# Patient Record
Sex: Female | Born: 1960 | Race: Black or African American | Hispanic: No | Marital: Single | Smoking: Current every day smoker
Health system: Southern US, Community
[De-identification: ages and names within clinical notes are randomized; demographics above are authoritative.]

## PROBLEM LIST (undated history)

## (undated) DIAGNOSIS — F4310 Post-traumatic stress disorder, unspecified: Secondary | ICD-10-CM

## (undated) DIAGNOSIS — R0981 Nasal congestion: Secondary | ICD-10-CM

## (undated) DIAGNOSIS — F329 Major depressive disorder, single episode, unspecified: Secondary | ICD-10-CM

## (undated) DIAGNOSIS — E785 Hyperlipidemia, unspecified: Secondary | ICD-10-CM

## (undated) DIAGNOSIS — R270 Ataxia, unspecified: Secondary | ICD-10-CM

## (undated) DIAGNOSIS — M1990 Unspecified osteoarthritis, unspecified site: Secondary | ICD-10-CM

## (undated) DIAGNOSIS — J45909 Unspecified asthma, uncomplicated: Secondary | ICD-10-CM

## (undated) DIAGNOSIS — R42 Dizziness and giddiness: Secondary | ICD-10-CM

## (undated) DIAGNOSIS — F319 Bipolar disorder, unspecified: Secondary | ICD-10-CM

## (undated) DIAGNOSIS — F32A Depression, unspecified: Secondary | ICD-10-CM

## (undated) DIAGNOSIS — I10 Essential (primary) hypertension: Secondary | ICD-10-CM

## (undated) DIAGNOSIS — J40 Bronchitis, not specified as acute or chronic: Secondary | ICD-10-CM

## (undated) DIAGNOSIS — M797 Fibromyalgia: Secondary | ICD-10-CM

## (undated) HISTORY — DX: Fibromyalgia: M79.7

## (undated) HISTORY — PX: ABDOMINAL HYSTERECTOMY: SHX81

---
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
## 2007-07-30 ENCOUNTER — Ambulatory Visit: Payer: Self-pay | Admitting: *Deleted

## 2007-08-14 ENCOUNTER — Ambulatory Visit: Payer: Self-pay | Admitting: Internal Medicine

## 2007-10-20 ENCOUNTER — Ambulatory Visit: Payer: Self-pay | Admitting: Family Medicine

## 2007-10-20 LAB — CONVERTED CEMR LAB
ALT: 23 units/L (ref 0–35)
AST: 21 units/L (ref 0–37)
Albumin: 4.3 g/dL (ref 3.5–5.2)
Alkaline Phosphatase: 55 units/L (ref 39–117)
BUN: 8 mg/dL (ref 6–23)
Basophils Absolute: 0 10*3/uL (ref 0.0–0.1)
Basophils Relative: 0 % (ref 0–1)
CO2: 24 meq/L (ref 19–32)
Calcium: 9.6 mg/dL (ref 8.4–10.5)
Chloride: 101 meq/L (ref 96–112)
Cholesterol: 165 mg/dL (ref 0–200)
Creatinine, Ser: 0.92 mg/dL (ref 0.40–1.20)
Eosinophils Absolute: 0.1 10*3/uL (ref 0.0–0.7)
Eosinophils Relative: 1 % (ref 0–5)
Glucose, Bld: 121 mg/dL — ABNORMAL HIGH (ref 70–99)
HCT: 45.8 % (ref 36.0–46.0)
HDL: 73 mg/dL (ref >39)
HIV-1 antibody: NEGATIVE
HIV-2 Ab: NEGATIVE
HIV: REACTIVE
Hemoglobin: 14.5 g/dL (ref 12.0–15.0)
LDL Cholesterol: 63 mg/dL (ref 0–99)
Lymphocytes Relative: 42 % (ref 12–46)
Lymphs Abs: 2.2 10*3/uL (ref 0.7–4.0)
MCHC: 31.7 g/dL (ref 30.0–36.0)
MCV: 83.9 fL (ref 78.0–100.0)
Monocytes Absolute: 0.4 10*3/uL (ref 0.1–1.0)
Monocytes Relative: 7 % (ref 3–12)
Neutro Abs: 2.6 10*3/uL (ref 1.7–7.7)
Neutrophils Relative %: 49 % (ref 43–77)
Platelets: 228 10*3/uL (ref 150–400)
Potassium: 4.3 meq/L (ref 3.5–5.3)
RBC: 5.46 M/uL — ABNORMAL HIGH (ref 3.87–5.11)
RDW: 16.4 % — ABNORMAL HIGH (ref 11.5–15.5)
Sodium: 138 meq/L (ref 135–145)
TSH: 1.52 microintl units/mL (ref 0.350–4.50)
Total Bilirubin: 0.4 mg/dL (ref 0.3–1.2)
Total CHOL/HDL Ratio: 2.3
Total Protein: 6.9 g/dL (ref 6.0–8.3)
Triglycerides: 144 mg/dL (ref <150)
VLDL: 29 mg/dL (ref 0–40)
Vit D, 1,25-Dihydroxy: 26 — ABNORMAL LOW (ref 30–89)
WBC: 5.2 10*3/uL (ref 4.0–10.5)

## 2007-10-29 ENCOUNTER — Ambulatory Visit: Payer: Self-pay | Admitting: Internal Medicine

## 2007-11-13 ENCOUNTER — Ambulatory Visit: Payer: Self-pay | Admitting: Family Medicine

## 2008-03-26 ENCOUNTER — Ambulatory Visit: Payer: Self-pay | Admitting: Family Medicine

## 2008-08-31 ENCOUNTER — Ambulatory Visit: Payer: Self-pay | Admitting: Family Medicine

## 2009-07-26 ENCOUNTER — Emergency Department (HOSPITAL_COMMUNITY): Admission: EM | Admit: 2009-07-26 | Discharge: 2009-07-26 | Payer: Self-pay | Admitting: Emergency Medicine

## 2009-10-11 ENCOUNTER — Ambulatory Visit: Payer: Self-pay | Admitting: Internal Medicine

## 2010-02-15 ENCOUNTER — Ambulatory Visit (HOSPITAL_BASED_OUTPATIENT_CLINIC_OR_DEPARTMENT_OTHER): Admission: RE | Admit: 2010-02-15 | Payer: Self-pay | Source: Home / Self Care | Admitting: Internal Medicine

## 2010-02-15 ENCOUNTER — Ambulatory Visit (HOSPITAL_COMMUNITY): Admission: RE | Admit: 2010-02-15 | Payer: Self-pay | Source: Home / Self Care | Admitting: Internal Medicine

## 2010-02-23 ENCOUNTER — Other Ambulatory Visit (HOSPITAL_COMMUNITY): Payer: Self-pay | Admitting: *Deleted

## 2010-02-23 DIAGNOSIS — Z1231 Encounter for screening mammogram for malignant neoplasm of breast: Secondary | ICD-10-CM

## 2010-03-20 ENCOUNTER — Ambulatory Visit (HOSPITAL_BASED_OUTPATIENT_CLINIC_OR_DEPARTMENT_OTHER): Payer: Medicaid Other

## 2010-03-23 ENCOUNTER — Ambulatory Visit (HOSPITAL_COMMUNITY): Payer: Medicaid Other

## 2010-06-19 ENCOUNTER — Ambulatory Visit (HOSPITAL_BASED_OUTPATIENT_CLINIC_OR_DEPARTMENT_OTHER): Payer: Medicaid Other

## 2010-07-24 ENCOUNTER — Ambulatory Visit (HOSPITAL_BASED_OUTPATIENT_CLINIC_OR_DEPARTMENT_OTHER): Payer: Medicaid Other

## 2010-08-10 ENCOUNTER — Ambulatory Visit: Payer: Medicaid Other | Admitting: Physical Therapy

## 2010-08-15 ENCOUNTER — Other Ambulatory Visit (HOSPITAL_COMMUNITY): Payer: Self-pay | Admitting: Internal Medicine

## 2010-08-15 DIAGNOSIS — Z1231 Encounter for screening mammogram for malignant neoplasm of breast: Secondary | ICD-10-CM

## 2010-09-04 ENCOUNTER — Ambulatory Visit (HOSPITAL_BASED_OUTPATIENT_CLINIC_OR_DEPARTMENT_OTHER): Payer: Medicaid Other

## 2010-09-12 ENCOUNTER — Ambulatory Visit (HOSPITAL_COMMUNITY)
Admission: RE | Admit: 2010-09-12 | Discharge: 2010-09-12 | Disposition: A | Discharge status: Home / Self Care | Payer: Medicaid Other | Source: Ambulatory Visit | Attending: Internal Medicine | Admitting: Internal Medicine

## 2010-09-12 DIAGNOSIS — Z1231 Encounter for screening mammogram for malignant neoplasm of breast: Secondary | ICD-10-CM | POA: Insufficient documentation

## 2010-09-13 ENCOUNTER — Ambulatory Visit (HOSPITAL_BASED_OUTPATIENT_CLINIC_OR_DEPARTMENT_OTHER): Payer: Medicaid Other | Attending: Internal Medicine

## 2010-09-13 DIAGNOSIS — G4733 Obstructive sleep apnea (adult) (pediatric): Secondary | ICD-10-CM | POA: Insufficient documentation

## 2010-09-13 DIAGNOSIS — Z79899 Other long term (current) drug therapy: Secondary | ICD-10-CM | POA: Insufficient documentation

## 2010-09-16 DIAGNOSIS — G4733 Obstructive sleep apnea (adult) (pediatric): Secondary | ICD-10-CM

## 2010-09-16 DIAGNOSIS — Z79899 Other long term (current) drug therapy: Secondary | ICD-10-CM

## 2010-09-17 NOTE — Procedures (Signed)
Kathleen Herrera, Kathleen Herrera             ACCOUNT NO.:  [id number]  MEDICAL RECORD NO.:  [id number]          PATIENT TYPE:  OUT  LOCATION:  SLEEP CENTER                 FACILITY:  [facility]  PHYSICIAN:  Theia Dezeeuw D. Maple Hudson, MD, FCCP, FACPDATE OF BIRTH:  10/01/1959  DATE OF STUDY:  09/13/2010                           NOCTURNAL POLYSOMNOGRAM  REFERRING PHYSICIAN:  Fleet Contras, M.D.  INDICATION FOR STUDY:  Hypersomnia with sleep apnea.  EPWORTH SLEEPINESS SCORE:  13/24.  BMI 44.6.  Weight 260 pounds.  Height 64 inches.  Neck 16 inches.  HOME MEDICATIONS:  Charted and reviewed.  SLEEP ARCHITECTURE:  Total sleep time 332 minutes with sleep efficiency 87.1%.  Stage I was 2.4%, stage II 86%, stage III absent, REM 11.6% of total sleep time.  Sleep latency 45.5 minutes.  REM latency 200 minutes. Awake after sleep onset 3 minutes.  Arousal index 15.4.  BEDTIME MEDICATIONS:  Listed as well by the technician included Lunesta, Paxil, Vistaril, Neurontin.  RESPIRATORY DATA:  Apnea-hypopnea index (AHI) 7 per hour.  A total of 39 events was scored including 5 obstructive apneas and 34 hypopneas. Events were seen in all sleep positions, particularly while supine.  REM AHI 1.6 per hour, RDI 9 per hour.  There were insufficient numbers of respiratory events to permit initiation of CPAP titration by split protocol on this study night.  OXYGEN DATA:  Loud snoring with oxygen desaturation to a nadir of 88% and a mean oxygen saturation through the study of 96.6% on room air.  CARDIAC DATA:  Normal sinus rhythm.  MOVEMENT-PARASOMNIA:  No significant movement disturbance.  No bathroom trips .  IMPRESSIONS-RECOMMENDATIONS: 1. Mild obstructive sleep apnea/hypopnea syndrome, AHI 7 per hour with     events in all sleep positions, loud snoring, and oxygen     desaturation to a nadir of 88% with mean saturation of 96.6% on     room air through the study. 2. Sleep pattern reflected medication, with marked  predominance of     stage II sleep and little incidental waking.  Bedtime medication     included Lunesta and other names as listed above.     Reace Breshears D. Maple Hudson, MD, [facility], FACP Diplomate, Biomedical engineer of Sleep Medicine Electronically Signed    CDY/MEDQ  D:  09/16/2010 09:30:55  T:  09/16/2010 10:03:59  Job:  [id number]

## 2011-01-13 ENCOUNTER — Encounter: Payer: Self-pay | Admitting: *Deleted

## 2011-01-13 ENCOUNTER — Emergency Department (HOSPITAL_COMMUNITY)
Admission: EM | Admit: 2011-01-13 | Discharge: 2011-01-13 | Disposition: A | Discharge status: Home / Self Care | Payer: Medicaid Other | Attending: Emergency Medicine | Admitting: Emergency Medicine

## 2011-01-13 ENCOUNTER — Emergency Department (HOSPITAL_COMMUNITY): Payer: Medicaid Other

## 2011-01-13 DIAGNOSIS — J111 Influenza due to unidentified influenza virus with other respiratory manifestations: Secondary | ICD-10-CM | POA: Insufficient documentation

## 2011-01-13 DIAGNOSIS — R0602 Shortness of breath: Secondary | ICD-10-CM | POA: Insufficient documentation

## 2011-01-13 DIAGNOSIS — J40 Bronchitis, not specified as acute or chronic: Secondary | ICD-10-CM | POA: Insufficient documentation

## 2011-01-13 HISTORY — DX: Hyperlipidemia, unspecified: E78.5

## 2011-01-13 HISTORY — DX: Depression, unspecified: F32.A

## 2011-01-13 HISTORY — DX: Major depressive disorder, single episode, unspecified: F32.9

## 2011-01-13 HISTORY — DX: Bronchitis, not specified as acute or chronic: J40

## 2011-01-13 HISTORY — DX: Post-traumatic stress disorder, unspecified: F43.10

## 2011-01-13 HISTORY — DX: Nasal congestion: R09.81

## 2011-01-13 HISTORY — DX: Unspecified osteoarthritis, unspecified site: M19.90

## 2011-01-13 LAB — CBC
MCH: 27.3 pg (ref 26.0–34.0)
MCHC: 32.9 g/dL (ref 30.0–36.0)
MCV: 83 fL (ref 78.0–100.0)
Platelets: 172 10*3/uL (ref 150–400)
RDW: 13.8 % (ref 11.5–15.5)

## 2011-01-13 LAB — BASIC METABOLIC PANEL
Calcium: 10.2 mg/dL (ref 8.4–10.5)
Creatinine, Ser: 1 mg/dL (ref 0.50–1.10)
GFR calc Af Amer: 74 mL/min — ABNORMAL LOW (ref >90)

## 2011-01-13 MED ORDER — AZITHROMYCIN 250 MG PO TABS: 250.0000 mg | ORAL_TABLET | Freq: Every day | ORAL | Status: AC

## 2011-01-13 MED ORDER — NAPROXEN 500 MG PO TABS: 500.0000 mg | ORAL_TABLET | Freq: Two times a day (BID) | ORAL | Status: AC

## 2011-01-13 MED ORDER — ALBUTEROL SULFATE HFA 108 (90 BASE) MCG/ACT IN AERS: 1.0000 | INHALATION_SPRAY | Freq: Four times a day (QID) | RESPIRATORY_TRACT | Status: DC | PRN

## 2011-01-13 MED ORDER — IBUPROFEN 800 MG PO TABS
ORAL_TABLET | ORAL | Status: AC
  Administered 2011-01-13: 800 mg via ORAL
  Filled 2011-01-13: qty 1

## 2011-01-13 MED ORDER — ACETAMINOPHEN 325 MG PO TABS
650.0000 mg | ORAL_TABLET | Freq: Once | ORAL | Status: AC
  Administered 2011-01-13: 650 mg via ORAL
  Filled 2011-01-13: qty 2

## 2011-01-13 NOTE — ED Notes (Signed)
Temp now 100.5  Voices she is feeling slightly improved---D/cd home with friend.

## 2011-01-13 NOTE — ED Notes (Signed)
Pt in room 25 with family at bedside.

## 2011-01-13 NOTE — ED Provider Notes (Signed)
History     CSN: [id number] Arrival date & time: 01/13/2011  7:12 AM   First MD Initiated Contact with Patient 01/13/11 [phone]      Chief Complaint  Patient presents with  . Shortness of Breath    pt states 5-6 days ago pt was dx with bronchitis. pt was given z pack for 5 days. pt now out of antibiotics. pt now with nonproductive cough.     (Consider location/radiation/quality/duration/timing/severity/associated sxs/prior treatment) Patient is a 50 y.o. female presenting with shortness of breath. The history is provided by the patient, the EMS personnel and a relative.  Shortness of Breath  The current episode started 3 to 5 days ago. The problem has been gradually worsening. The problem is moderate. The symptoms are relieved by nothing. The symptoms are aggravated by nothing. Associated symptoms include a fever, cough and shortness of breath. Pertinent negatives include no chest pain, no chest pressure, no sore throat, no stridor and no wheezing. The cough is productive. Nothing relieves the cough. Nothing worsens the cough.   Patient with complaint of shortness of breath productive cough of bronchitis that is improved with Zithromax last had Zithromax 3 weeks ago. There's been ongoing for the past week but getting worse recently. He did with some bodyaches and temp 102F.   Patient arrived she was given breakfast and was eating that fine appeared in no acute distress.  Past Medical History  Diagnosis Date  . Cancer   . Hyperlipemia   . Arthritis   . Sinus congestion   . Depression   . Post traumatic stress disorder   . Bronchitis     History reviewed. No pertinent past surgical history.  History reviewed. No pertinent family history.  History  Substance Use Topics  . Smoking status: Current Everyday Smoker    Types: Cigarettes  . Smokeless tobacco: Not on file  . Alcohol Use: Yes     occasionally    OB History    Grav Para Term Preterm Abortions TAB SAB Ect Mult Living                    Review of Systems  Constitutional: Positive for fever.  HENT: Negative for congestion, sore throat and neck pain.   Eyes: Negative for redness and visual disturbance.  Respiratory: Positive for cough and shortness of breath. Negative for wheezing and stridor.   Cardiovascular: Negative for chest pain.  Gastrointestinal: Negative for nausea, vomiting and abdominal pain.  Genitourinary: Negative for dysuria.  Musculoskeletal: Negative for back pain.  Skin: Negative for rash.  Neurological: Negative for headaches.  Hematological: Does not bruise/bleed easily.    Allergies  Review of patient's allergies indicates no known allergies.  Home Medications   Current Outpatient Rx  Name Route Sig Dispense Refill  . ATENOLOL 100 MG PO TABS Oral Take 100 mg by mouth daily.      . CELECOXIB 200 MG PO CAPS Oral Take 200 mg by mouth 2 (two) times daily.      Marland Kitchen CO-ENZYME Q-10 50 MG PO CAPS Oral Take 50 mg by mouth 2 (two) times daily.      Marland Kitchen LITHIUM CARBONATE 300 MG PO CAPS Oral Take 300 mg by mouth 3 (three) times daily with meals.      Carma Leaven M PLUS PO TABS Oral Take 1 tablet by mouth daily.      Marland Kitchen PAROXETINE HCL 40 MG PO TABS Oral Take 40 mg by mouth every morning.      Marland Kitchen  SIMVASTATIN 40 MG PO TABS Oral Take 40 mg by mouth at bedtime.      . SPIRONOLACTONE 50 MG PO TABS Oral Take 50 mg by mouth daily.      . ALBUTEROL SULFATE HFA 108 (90 BASE) MCG/ACT IN AERS Inhalation Inhale 1-2 puffs into the lungs every 6 (six) hours as needed for wheezing. 1 Inhaler 0  . AZITHROMYCIN 250 MG PO TABS Oral Take 1 tablet (250 mg total) by mouth daily. Take first 2 tablets together, then 1 every day until finished. 6 tablet 0  . NAPROXEN 500 MG PO TABS Oral Take 1 tablet (500 mg total) by mouth 2 (two) times daily. 20 tablet 0    BP 119/62  Pulse 80  Temp(Src) 98.7 F (37.1 C) (Oral)  Resp 18  Ht 5\' 4"  (1.626 m)  Wt 260 lb (117.935 kg)  BMI 44.63 kg/m2  SpO2 95%  Physical Exam   Nursing note and vitals reviewed. Constitutional: She is oriented to person, place, and time. She appears well-developed and well-nourished. No distress.  HENT:  Head: Normocephalic and atraumatic.  Mouth/Throat: Oropharynx is clear and moist.  Eyes: Conjunctivae and EOM are normal. Pupils are equal, round, and reactive to light.  Neck: Normal range of motion. Neck supple.  Cardiovascular: Normal rate, regular rhythm and normal heart sounds.   No murmur heard. Pulmonary/Chest: Effort normal and breath sounds normal. No respiratory distress. She has no wheezes. She has no rales.  Abdominal: Soft. Bowel sounds are normal. There is no tenderness.  Musculoskeletal: Normal range of motion.  Neurological: She is alert and oriented to person, place, and time. No cranial nerve deficit. She exhibits normal muscle tone. Coordination normal.  Skin: Skin is warm. No rash noted.    ED Course  Procedures (including critical care time)  Labs Reviewed  CBC - Abnormal; Notable for the following:    RBC 5.24 (*)    All other components within normal limits  BASIC METABOLIC PANEL - Abnormal; Notable for the following:    Glucose, Bld 118 (*)    GFR calc non Af Amer 64 (*)    GFR calc Af Amer 74 (*)    All other components within normal limits   Dg Chest 2 View  01/13/2011  *RADIOLOGY REPORT*  Clinical Data: Cough.  Fever.  CHEST - 2 VIEW  Comparison: None.  Findings: Low volume chest.  Bilateral basilar atelectasis. Cardiopericardial silhouette and mediastinal contours appear within normal limits.  No airspace disease.  Negative for effusion.  Suboptimal evaluation anterior mediastinum on the lateral view because the arms are not raised over head.  IMPRESSION: Low volume chest.  No gross acute cardiopulmonary disease.  Original Report Authenticated By: Andreas Newport, M.D.     1. Bronchitis with flu       MDM   Several day history of cough and shortness of breath more recently started with  fever suggestive of past upper rest drink section and is now moved into more an influenza-like illness. As bodyaches 10 202.5 here chest x-ray negative for pneumonia. In no acute distress nontoxic oxygen saturation on room air is 93%. The with Zithromax for bronchitis component is as helped her in the past also given albuterol inhaler Naprosyn for bodyaches and Tylenol for fever. Has a primary care provider that she can followup with. White blood cell count is normal, pain abnormalities on the patient's electrolytes.        Shelda Jakes, MD 01/13/11 [phone]

## 2011-01-13 NOTE — ED Notes (Signed)
Oral temp 102.5--waiting for temp to downward trend so pt. Can be d/ cd.

## 2011-01-13 NOTE — ED Notes (Signed)
Returned from x-ray and c/o generalized achiness---Oral temp retaken--102.3

## 2011-09-26 ENCOUNTER — Other Ambulatory Visit (HOSPITAL_COMMUNITY): Payer: Self-pay | Admitting: Nephrology

## 2011-09-26 DIAGNOSIS — Z1231 Encounter for screening mammogram for malignant neoplasm of breast: Secondary | ICD-10-CM

## 2011-10-15 ENCOUNTER — Ambulatory Visit (HOSPITAL_COMMUNITY): Payer: Medicaid Other

## 2011-10-26 ENCOUNTER — Ambulatory Visit (HOSPITAL_COMMUNITY)
Admission: RE | Admit: 2011-10-26 | Discharge: 2011-10-26 | Disposition: A | Discharge status: Home / Self Care | Payer: Medicaid Other | Source: Ambulatory Visit | Attending: Nephrology | Admitting: Nephrology

## 2011-10-26 DIAGNOSIS — Z1231 Encounter for screening mammogram for malignant neoplasm of breast: Secondary | ICD-10-CM | POA: Insufficient documentation

## 2012-04-17 DIAGNOSIS — F32A Depression, unspecified: Secondary | ICD-10-CM | POA: Insufficient documentation

## 2012-04-17 DIAGNOSIS — F3181 Bipolar II disorder: Secondary | ICD-10-CM | POA: Insufficient documentation

## 2012-05-26 DIAGNOSIS — M1990 Unspecified osteoarthritis, unspecified site: Secondary | ICD-10-CM | POA: Insufficient documentation

## 2012-06-12 DIAGNOSIS — M1990 Unspecified osteoarthritis, unspecified site: Secondary | ICD-10-CM | POA: Insufficient documentation

## 2012-07-15 DIAGNOSIS — M545 Low back pain, unspecified: Secondary | ICD-10-CM | POA: Insufficient documentation

## 2012-07-15 DIAGNOSIS — K760 Fatty (change of) liver, not elsewhere classified: Secondary | ICD-10-CM | POA: Insufficient documentation

## 2012-07-15 DIAGNOSIS — M25569 Pain in unspecified knee: Secondary | ICD-10-CM | POA: Insufficient documentation

## 2012-10-01 ENCOUNTER — Other Ambulatory Visit (HOSPITAL_COMMUNITY): Payer: Self-pay | Admitting: Physician Assistant

## 2012-10-01 DIAGNOSIS — Z1231 Encounter for screening mammogram for malignant neoplasm of breast: Secondary | ICD-10-CM

## 2012-10-30 ENCOUNTER — Ambulatory Visit (HOSPITAL_COMMUNITY): Payer: Medicaid Other

## 2012-11-04 ENCOUNTER — Ambulatory Visit (HOSPITAL_COMMUNITY): Payer: Medicaid Other | Attending: Physician Assistant

## 2012-12-04 ENCOUNTER — Ambulatory Visit (HOSPITAL_COMMUNITY): Payer: Medicaid Other | Attending: Physician Assistant

## 2012-12-31 ENCOUNTER — Ambulatory Visit (HOSPITAL_COMMUNITY): Payer: Medicaid Other | Attending: Physician Assistant

## 2013-02-02 ENCOUNTER — Other Ambulatory Visit (HOSPITAL_COMMUNITY): Payer: Self-pay | Admitting: Physician Assistant

## 2013-02-02 DIAGNOSIS — Z1231 Encounter for screening mammogram for malignant neoplasm of breast: Secondary | ICD-10-CM

## 2013-02-05 DIAGNOSIS — M797 Fibromyalgia: Secondary | ICD-10-CM

## 2013-02-05 HISTORY — DX: Fibromyalgia: M79.7

## 2013-02-13 ENCOUNTER — Ambulatory Visit (HOSPITAL_COMMUNITY): Payer: Medicaid Other

## 2013-03-05 ENCOUNTER — Ambulatory Visit (HOSPITAL_COMMUNITY): Admission: RE | Admit: 2013-03-05 | Payer: Medicaid Other | Source: Ambulatory Visit

## 2013-03-06 DIAGNOSIS — R945 Abnormal results of liver function studies: Secondary | ICD-10-CM | POA: Insufficient documentation

## 2013-03-06 DIAGNOSIS — R7989 Other specified abnormal findings of blood chemistry: Secondary | ICD-10-CM | POA: Insufficient documentation

## 2013-11-30 ENCOUNTER — Emergency Department (HOSPITAL_COMMUNITY)
Admission: EM | Admit: 2013-11-30 | Discharge: 2013-11-30 | Disposition: A | Discharge status: Home / Self Care | Payer: Medicaid Other | Attending: Emergency Medicine | Admitting: Emergency Medicine

## 2013-11-30 ENCOUNTER — Emergency Department (HOSPITAL_COMMUNITY): Payer: Medicaid Other

## 2013-11-30 ENCOUNTER — Encounter (HOSPITAL_COMMUNITY): Payer: Self-pay | Admitting: Emergency Medicine

## 2013-11-30 DIAGNOSIS — Z79899 Other long term (current) drug therapy: Secondary | ICD-10-CM | POA: Insufficient documentation

## 2013-11-30 DIAGNOSIS — Y9389 Activity, other specified: Secondary | ICD-10-CM | POA: Insufficient documentation

## 2013-11-30 DIAGNOSIS — Z720 Tobacco use: Secondary | ICD-10-CM | POA: Insufficient documentation

## 2013-11-30 DIAGNOSIS — E785 Hyperlipidemia, unspecified: Secondary | ICD-10-CM | POA: Insufficient documentation

## 2013-11-30 DIAGNOSIS — S62305A Unspecified fracture of fourth metacarpal bone, left hand, initial encounter for closed fracture: Secondary | ICD-10-CM | POA: Insufficient documentation

## 2013-11-30 DIAGNOSIS — Y929 Unspecified place or not applicable: Secondary | ICD-10-CM | POA: Insufficient documentation

## 2013-11-30 DIAGNOSIS — S62309A Unspecified fracture of unspecified metacarpal bone, initial encounter for closed fracture: Secondary | ICD-10-CM

## 2013-11-30 DIAGNOSIS — W1839XA Other fall on same level, initial encounter: Secondary | ICD-10-CM | POA: Insufficient documentation

## 2013-11-30 DIAGNOSIS — F329 Major depressive disorder, single episode, unspecified: Secondary | ICD-10-CM | POA: Insufficient documentation

## 2013-11-30 DIAGNOSIS — S62307A Unspecified fracture of fifth metacarpal bone, left hand, initial encounter for closed fracture: Secondary | ICD-10-CM | POA: Insufficient documentation

## 2013-11-30 DIAGNOSIS — W19XXXA Unspecified fall, initial encounter: Secondary | ICD-10-CM

## 2013-11-30 DIAGNOSIS — Z8709 Personal history of other diseases of the respiratory system: Secondary | ICD-10-CM | POA: Insufficient documentation

## 2013-11-30 DIAGNOSIS — M1990 Unspecified osteoarthritis, unspecified site: Secondary | ICD-10-CM | POA: Insufficient documentation

## 2013-11-30 DIAGNOSIS — Z859 Personal history of malignant neoplasm, unspecified: Secondary | ICD-10-CM | POA: Insufficient documentation

## 2013-11-30 MED ORDER — PENICILLIN V POTASSIUM 500 MG PO TABS: 500.0000 mg | ORAL_TABLET | Freq: Four times a day (QID) | ORAL | Status: DC

## 2013-11-30 MED ORDER — OXYCODONE-ACETAMINOPHEN 5-325 MG PO TABS: 1.0000 | ORAL_TABLET | Freq: Four times a day (QID) | ORAL | Status: DC | PRN

## 2013-11-30 MED ORDER — POLYETHYLENE GLYCOL 3350 17 G PO PACK: 17.0000 g | PACK | Freq: Every day | ORAL | Status: DC

## 2013-11-30 MED ORDER — DOCUSATE SODIUM 100 MG PO CAPS: 100.0000 mg | ORAL_CAPSULE | Freq: Two times a day (BID) | ORAL | Status: DC

## 2013-11-30 MED ORDER — OXYCODONE-ACETAMINOPHEN 5-325 MG PO TABS
1.0000 | ORAL_TABLET | Freq: Once | ORAL | Status: AC
  Administered 2013-11-30: 1 via ORAL
  Filled 2013-11-30: qty 1

## 2013-11-30 MED ORDER — IBUPROFEN 800 MG PO TABS: 800.0000 mg | ORAL_TABLET | Freq: Three times a day (TID) | ORAL | Status: DC | PRN

## 2013-11-30 MED ORDER — ATENOLOL 100 MG PO TABS: 100.0000 mg | ORAL_TABLET | Freq: Every day | ORAL | Status: DC

## 2013-11-30 MED ORDER — GLYCERIN (LAXATIVE) 2 G RE SUPP: 1.0000 | Freq: Once | RECTAL | Status: DC

## 2013-11-30 NOTE — ED Provider Notes (Signed)
CSN: [id number]     Arrival date & time 11/30/13  1041 History   First MD Initiated Contact with Patient 11/30/13 1051     Chief Complaint  Patient presents with  . Fall  . Hand Pain  . Medication Refill     (Consider location/radiation/quality/duration/timing/severity/associated sxs/prior Treatment) HPI Patient presents to the emergency department with a fall that occurred just prior to arrival.  The patient states she shut a bathtub when she fell forward landing on her left wrist.  Patient states she did not hit her head or loose consciousness.  Patient denies chest pain, shortness of breath, nausea, vomiting, weakness, headache, blurred vision, neck pain, back pain or syncope.  Patient states that she did not take any medications prior to arrival Past Medical History  Diagnosis Date  . Cancer   . Hyperlipemia   . Arthritis   . Sinus congestion   . Depression   . Post traumatic stress disorder   . Bronchitis    No past surgical history on file. No family history on file. History  Substance Use Topics  . Smoking status: Current Every Day Smoker    Types: Cigarettes  . Smokeless tobacco: Not on file  . Alcohol Use: Yes     Comment: occasionally   OB History   Grav Para Term Preterm Abortions TAB SAB Ect Mult Living                 Review of Systems   All other systems negative except as documented in the HPI. All pertinent positives and negatives as reviewed in the HPI. Allergies  Review of patient's allergies indicates no known allergies.  Home Medications   Prior to Admission medications   Medication Sig Start Date End Date Taking? Authorizing Provider  albuterol (PROVENTIL HFA;VENTOLIN HFA) 108 (90 BASE) MCG/ACT inhaler Inhale 1-2 puffs into the lungs every 6 (six) hours as needed for wheezing. 01/13/11 01/13/12  Vanetta MuldersScott Zackowski, MD  atenolol (TENORMIN) 100 MG tablet Take 1 tablet (100 mg total) by mouth daily. 11/30/13   Jamesetta Orleanshristopher W Dorethia Jeanmarie, PA-C  celecoxib  (CELEBREX) 200 MG capsule Take 200 mg by mouth 2 (two) times daily.      Historical Provider, MD  co-enzyme Q-10 50 MG capsule Take 50 mg by mouth 2 (two) times daily.      Historical Provider, MD  docusate sodium (COLACE) 100 MG capsule Take 1 capsule (100 mg total) by mouth every 12 (twelve) hours. 11/30/13   Jamesetta Orleanshristopher W Elva Mauro, PA-C  glycerin adult (GLYCERIN ADULT) 2 G SUPP Place 1 suppository rectally once. 11/30/13   Carlyle Dollyhristopher W Babbette Dalesandro, PA-C  lithium carbonate 300 MG capsule Take 300 mg by mouth 3 (three) times daily with meals.      Historical Provider, MD  Multiple Vitamins-Minerals (MULTIVITAMINS THER. W/MINERALS) TABS Take 1 tablet by mouth daily.      Historical Provider, MD  oxyCODONE-acetaminophen (PERCOCET/ROXICET) 5-325 MG per tablet Take 1 tablet by mouth every 6 (six) hours as needed for severe pain. 11/30/13   Jamesetta Orleanshristopher W Jalayla Chrismer, PA-C  PARoxetine (PAXIL) 40 MG tablet Take 40 mg by mouth every morning.      Historical Provider, MD  polyethylene glycol (MIRALAX) packet Take 17 g by mouth daily. 11/30/13   Jamesetta Orleanshristopher W Auriel Kist, PA-C  simvastatin (ZOCOR) 40 MG tablet Take 40 mg by mouth at bedtime.      Historical Provider, MD  spironolactone (ALDACTONE) 50 MG tablet Take 50 mg by mouth daily.  Historical Provider, MD   BP 140/57  Pulse 85  Temp(Src) 98.6 F (37 C) (Oral)  Resp 16  SpO2 99% Physical Exam  Nursing note and vitals reviewed. Constitutional: She is oriented to person, place, and time. She appears well-developed and well-nourished. No distress.  HENT:  Head: Normocephalic and atraumatic.  Eyes: Pupils are equal, round, and reactive to light.  Neck: Normal range of motion. Neck supple.  Cardiovascular: Normal rate, regular rhythm and normal heart sounds.   Pulmonary/Chest: Effort normal and breath sounds normal.  Musculoskeletal:       Left hand: She exhibits decreased range of motion, tenderness, bony tenderness and swelling. She exhibits normal  two-point discrimination, normal capillary refill, no deformity and no laceration. Normal sensation noted. Normal strength noted.  Neurological: She is alert and oriented to person, place, and time. She exhibits normal muscle tone. Coordination normal.  Skin: Skin is warm and dry.    ED Course  Procedures (including critical care time) Labs Review Labs Reviewed - No data to display  Imaging Review Dg Wrist Complete Left  11/30/2013   CLINICAL DATA:  Patient fell in the shower today with pain swelling posterior left hand and wrist, initial evaluation  EXAM: LEFT WRIST - COMPLETE 3+ VIEW  COMPARISON:  None.  FINDINGS: Mild arthritis of the first carpal metacarpal joint. No fracture or dislocation. No focal or acute osseous abnormalities otherwise.  IMPRESSION: No acute findings   Electronically Signed   By: Esperanza Heiraymond  Rubner M.D.   On: 11/30/2013 12:46   Dg Hand Complete Left  11/30/2013   CLINICAL DATA:  Fall in shower today.  Pain  EXAM: LEFT HAND - COMPLETE 3+ VIEW  COMPARISON:  None  FINDINGS: Nondisplaced fracture mid fourth metacarpal.  Mildly angulated and displaced fracture distal fifth metacarpal  No other fracture is identified.  No significant arthropathy.  IMPRESSION: Fractures of the fourth and fifth metacarpals   Electronically Signed   By: Marlan Palauharles  Clark M.D.   On: 11/30/2013 12:47    Patient referred to hand surgery for further evaluation and care following her injury.  She is placed in an ulnar gutter splint.  Told to return here as needed.  Told ice and elevate her hand  Carlyle DollyChristopher W Wiliam Cauthorn, PA-C 11/30/13 1950

## 2013-11-30 NOTE — ED Notes (Signed)
Waiting for ortho 

## 2013-11-30 NOTE — Discharge Instructions (Signed)
Follow up with the orthopedist provided. Ice and elevate the hand.

## 2013-11-30 NOTE — ED Notes (Signed)
Pt reports that she ran out of HTN medication and needs a refill.  Sts she is unable to get into her PCP today.

## 2013-11-30 NOTE — ED Notes (Signed)
Per EMS: Pt from home.  Called out because she fell while she was getting ready this morning.  States that she frequently has dizziness from behavioral health meds that she takes.  No LOC. Bilateral knee pain and lt hand pain.  Swelling noted.  Good distal PMS.  EKG normal w/ EMS.  22 in Lt forearm.  Has been given 50 mcg fentanyl.

## 2013-11-30 NOTE — ED Notes (Signed)
Patient transported to X-ray 

## 2013-11-30 NOTE — ED Provider Notes (Deleted)
CSN: [id number]     Arrival date & time 11/30/13  1041 History   First MD Initiated Contact with Patient 11/30/13 1051     Chief Complaint  Patient presents with  . Fall  . Hand Pain     (Consider location/radiation/quality/duration/timing/severity/associated sxs/prior Treatment) HPI Patient presents to the emergency department with right upper third molar pain and swelling.  The patient states that she noticed 2 days ago became painful and the swelling started yesterday.  The patient states that nothing she tried at home helped with her pain.  The patient states that movement and palpation make the pain worse.  Patient denies nausea, vomiting, throat swelling, tongue swelling,  Neck swelling, chest pain, shortness of breath, headache, blurred vision, weakness, dizziness, lightheadedness or syncope.  The patient also denies having any fevers.  Patient states that she did make an appointment with her dentist.  Patient was complaining earlier of some knee pain but no longer has any complaints of this.  She states that her medicines knee make her dizzy at times felt like it is more related to pain. Past Medical History  Diagnosis Date  . Cancer   . Hyperlipemia   . Arthritis   . Sinus congestion   . Depression   . Post traumatic stress disorder   . Bronchitis    No past surgical history on file. No family history on file. History  Substance Use Topics  . Smoking status: Current Every Day Smoker    Types: Cigarettes  . Smokeless tobacco: Not on file  . Alcohol Use: Yes     Comment: occasionally   OB History   Grav Para Term Preterm Abortions TAB SAB Ect Mult Living                 Review of Systems  All other systems negative except as documented in the HPI. All pertinent positives and negatives as reviewed in the HPI.   Allergies  Review of patient's allergies indicates no known allergies.  Home Medications   Prior to Admission medications   Medication Sig Start Date  End Date Taking? Authorizing Provider  albuterol (PROVENTIL HFA;VENTOLIN HFA) 108 (90 BASE) MCG/ACT inhaler Inhale 1-2 puffs into the lungs every 6 (six) hours as needed for wheezing. 01/13/11 01/13/12  Vanetta MuldersScott Zackowski, MD  atenolol (TENORMIN) 100 MG tablet Take 100 mg by mouth daily.      Historical Provider, MD  celecoxib (CELEBREX) 200 MG capsule Take 200 mg by mouth 2 (two) times daily.      Historical Provider, MD  co-enzyme Q-10 50 MG capsule Take 50 mg by mouth 2 (two) times daily.      Historical Provider, MD  lithium carbonate 300 MG capsule Take 300 mg by mouth 3 (three) times daily with meals.      Historical Provider, MD  Multiple Vitamins-Minerals (MULTIVITAMINS THER. W/MINERALS) TABS Take 1 tablet by mouth daily.      Historical Provider, MD  PARoxetine (PAXIL) 40 MG tablet Take 40 mg by mouth every morning.      Historical Provider, MD  simvastatin (ZOCOR) 40 MG tablet Take 40 mg by mouth at bedtime.      Historical Provider, MD  spironolactone (ALDACTONE) 50 MG tablet Take 50 mg by mouth daily.      Historical Provider, MD   There were no vitals taken for this visit. Physical Exam  Nursing note and vitals reviewed. Constitutional: She is oriented to person, place, and time. She appears well-developed and  well-nourished. No distress.  HENT:  Head: Normocephalic and atraumatic.  Mouth/Throat: Uvula is midline, oropharynx is clear and moist and mucous membranes are normal. No trismus in the jaw. Abnormal dentition. Dental caries present. No uvula swelling. No posterior oropharyngeal edema or posterior oropharyngeal erythema.    Eyes: Pupils are equal, round, and reactive to light.  Neck: Normal range of motion. Neck supple.  Cardiovascular: Normal rate, regular rhythm and normal heart sounds.  Exam reveals no gallop and no friction rub.   No murmur heard. Pulmonary/Chest: Effort normal and breath sounds normal. No respiratory distress.  Neurological: She is alert and oriented to  person, place, and time. She exhibits normal muscle tone. Coordination normal.  Skin: Skin is warm and dry. No rash noted. No erythema.    ED Course  Procedures (including critical care time)  Patient was treated for dental abscess and referred to dentistry and told to rinse with warm water and peroxide 3 times a day.  Told to return here as needed  Carlyle DollyChristopher W Jeffey Janssen, PA-C 11/30/13 1115

## 2013-12-01 NOTE — ED Provider Notes (Signed)
Medical screening examination/treatment/procedure(s) were performed by non-physician practitioner and as supervising physician I was immediately available for consultation/collaboration.   EKG Interpretation None       Efren Kross L Nidya Bouyer, MD 12/01/13 0732 

## 2014-01-06 ENCOUNTER — Emergency Department (HOSPITAL_COMMUNITY)
Admission: EM | Admit: 2014-01-06 | Discharge: 2014-01-06 | Disposition: A | Discharge status: Home / Self Care | Payer: Medicaid Other | Attending: Emergency Medicine | Admitting: Emergency Medicine

## 2014-01-06 ENCOUNTER — Encounter (HOSPITAL_COMMUNITY): Payer: Self-pay

## 2014-01-06 DIAGNOSIS — N390 Urinary tract infection, site not specified: Secondary | ICD-10-CM | POA: Diagnosis not present

## 2014-01-06 DIAGNOSIS — Z859 Personal history of malignant neoplasm, unspecified: Secondary | ICD-10-CM | POA: Diagnosis not present

## 2014-01-06 DIAGNOSIS — Z8709 Personal history of other diseases of the respiratory system: Secondary | ICD-10-CM | POA: Diagnosis not present

## 2014-01-06 DIAGNOSIS — K5900 Constipation, unspecified: Secondary | ICD-10-CM | POA: Diagnosis not present

## 2014-01-06 DIAGNOSIS — F329 Major depressive disorder, single episode, unspecified: Secondary | ICD-10-CM | POA: Insufficient documentation

## 2014-01-06 DIAGNOSIS — M1990 Unspecified osteoarthritis, unspecified site: Secondary | ICD-10-CM | POA: Diagnosis not present

## 2014-01-06 DIAGNOSIS — Z720 Tobacco use: Secondary | ICD-10-CM | POA: Insufficient documentation

## 2014-01-06 DIAGNOSIS — R195 Other fecal abnormalities: Secondary | ICD-10-CM | POA: Diagnosis present

## 2014-01-06 DIAGNOSIS — F4310 Post-traumatic stress disorder, unspecified: Secondary | ICD-10-CM | POA: Diagnosis not present

## 2014-01-06 DIAGNOSIS — K649 Unspecified hemorrhoids: Secondary | ICD-10-CM | POA: Diagnosis not present

## 2014-01-06 DIAGNOSIS — R3915 Urgency of urination: Secondary | ICD-10-CM | POA: Diagnosis not present

## 2014-01-06 DIAGNOSIS — Z79899 Other long term (current) drug therapy: Secondary | ICD-10-CM | POA: Insufficient documentation

## 2014-01-06 LAB — URINALYSIS, ROUTINE W REFLEX MICROSCOPIC
Bilirubin Urine: NEGATIVE
GLUCOSE, UA: NEGATIVE mg/dL
HGB URINE DIPSTICK: NEGATIVE
Ketones, ur: NEGATIVE mg/dL
Nitrite: POSITIVE — AB
PH: 7 (ref 5.0–8.0)
Protein, ur: NEGATIVE mg/dL
SPECIFIC GRAVITY, URINE: 1.02 (ref 1.005–1.030)
UROBILINOGEN UA: 1 mg/dL (ref 0.0–1.0)

## 2014-01-06 LAB — URINE MICROSCOPIC-ADD ON

## 2014-01-06 MED ORDER — POLYETHYLENE GLYCOL 3350 17 GM/SCOOP PO POWD: 17.0000 g | Freq: Two times a day (BID) | ORAL | Status: DC

## 2014-01-06 MED ORDER — CEPHALEXIN 500 MG PO CAPS: 500.0000 mg | ORAL_CAPSULE | Freq: Four times a day (QID) | ORAL | Status: DC

## 2014-01-06 MED ORDER — ACETAMINOPHEN 325 MG PO TABS
650.0000 mg | ORAL_TABLET | Freq: Once | ORAL | Status: AC
  Administered 2014-01-06: 650 mg via ORAL
  Filled 2014-01-06: qty 2

## 2014-01-06 MED ORDER — CEPHALEXIN 500 MG PO CAPS
500.0000 mg | ORAL_CAPSULE | Freq: Once | ORAL | Status: AC
  Administered 2014-01-06: 500 mg via ORAL
  Filled 2014-01-06: qty 1

## 2014-01-06 NOTE — ED Notes (Signed)
Bed: WA06 Expected date:  Expected time:  Means of arrival:  Comments: EMS/anorexia/rectal pain

## 2014-01-06 NOTE — ED Notes (Signed)
MD at bedside. 

## 2014-01-06 NOTE — ED Notes (Signed)
Per [facility]- Pt presents with NAD_ Pt c/o of rectal pain and pressure with swelling upon palpation. Pt denies N/V/D and fever. Pt c/o of lack of appetite. Pt c/o of increased sensation of bowel movement with standing. Denies stomach pain. Rates rectal pain 10/10. With positioning 6/10

## 2014-01-06 NOTE — ED Provider Notes (Signed)
CSN: [id number]     Arrival date & time 01/06/14  1345 History   First MD Initiated Contact with Patient 01/06/14 1458     Chief Complaint  Patient presents with  . Rectal Pain  . Blood In Stools   Kathleen Herrera is a 53 y.o. female with a straight hypertension who presents the emergency complaining of 4 days of constipation and pain while having bowel movements and urgency of urination. Patient reports she's been having hard stools and straining to stool over the past 4 days. Patient has noticed hemorrhoids that will prolapse when straining to stool. The hemorrhoids do not remain prolapsed. Patient denies any blood in her stool. Patient reports she has pain while sitting down or while having a bowel movement. Patient put she has no pain if she is not sitting or having a bowel movement. Patient states her pain is 10 out of 10 when having a bowel movement. The patient reports she's been taking tramadol for the past several weeks. The patient reports she is had this problem with hemorrhoids previously and took MiraLAX a month ago. This resolved her symptoms. Patient has not taken any MiraLAX recently. Patient denies decreased appetite contrary to nurse's note. The patient is requesting ginger ale and crackers because she is very hungry. Patient denies fevers, chills, loss of bowel or bladder control, hematochezia, dysuria, hematuria, abdominal pain, nausea, vomiting, diarrhea, vaginal bleeding or discharge.   (Consider location/radiation/quality/duration/timing/severity/associated sxs/prior Treatment) HPI  Past Medical History  Diagnosis Date  . Cancer   . Hyperlipemia   . Arthritis   . Sinus congestion   . Depression   . Post traumatic stress disorder   . Bronchitis    History reviewed. No pertinent past surgical history. No family history on file. History  Substance Use Topics  . Smoking status: Current Every Day Smoker    Types: Cigarettes  . Smokeless tobacco: Not on file  .  Alcohol Use: Yes     Comment: occasionally   OB History    No data available     Review of Systems  Constitutional: Negative for fever and chills.  HENT: Negative for congestion, ear pain, sneezing, sore throat and trouble swallowing.   Eyes: Negative for pain, redness and visual disturbance.  Respiratory: Negative for cough, shortness of breath and wheezing.   Cardiovascular: Negative for chest pain and palpitations.  Gastrointestinal: Positive for constipation and rectal pain. Negative for nausea, vomiting, abdominal pain, diarrhea, blood in stool, abdominal distention and anal bleeding.  Genitourinary: Positive for urgency. Negative for dysuria, frequency, hematuria, flank pain and difficulty urinating.  Musculoskeletal: Negative for back pain and neck pain.  Skin: Negative for pallor and rash.  Neurological: Negative for headaches.  All other systems reviewed and are negative.     Allergies  Review of patient's allergies indicates no known allergies.  Home Medications   Prior to Admission medications   Medication Sig Start Date End Date Taking? Authorizing Provider  amitriptyline (ELAVIL) 100 MG tablet Take 100 mg by mouth at bedtime.   Yes Historical Provider, MD  cycloSPORINE (RESTASIS) 0.05 % ophthalmic emulsion Place 1 drop into both eyes every 12 (twelve) hours.   Yes Historical Provider, MD  docusate sodium (COLACE) 100 MG capsule Take 1 capsule (100 mg total) by mouth every 12 (twelve) hours. 11/30/13  Yes Jamesetta Orleanshristopher W Lawyer, PA-C  gabapentin (NEURONTIN) 300 MG capsule Take 300 mg by mouth 4 (four) times daily.   Yes Historical Provider, MD  glycerin adult (  GLYCERIN ADULT) 2 G SUPP Place 1 suppository rectally once. Patient taking differently: Place 1 suppository rectally daily as needed for moderate constipation or severe constipation.  11/30/13  Yes Jamesetta Orleanshristopher W Lawyer, PA-C  hydrOXYzine (VISTARIL) 50 MG capsule Take 50 mg by mouth 3 (three) times daily.   Yes  Historical Provider, MD  metoprolol succinate (TOPROL-XL) 50 MG 24 hr tablet Take 100 mg by mouth daily. Take with or immediately following a meal.   Yes Historical Provider, MD  pantoprazole (PROTONIX) 40 MG tablet Take 40 mg by mouth 2 (two) times daily.   Yes Historical Provider, MD  PARoxetine (PAXIL) 20 MG tablet Take 20 mg by mouth 2 (two) times daily.   Yes Historical Provider, MD  QUEtiapine (SEROQUEL XR) 300 MG 24 hr tablet Take 300 mg by mouth daily.   Yes Historical Provider, MD  spironolactone (ALDACTONE) 50 MG tablet Take 50 mg by mouth daily.     Yes Historical Provider, MD  albuterol (PROVENTIL HFA;VENTOLIN HFA) 108 (90 BASE) MCG/ACT inhaler Inhale 1-2 puffs into the lungs every 6 (six) hours as needed for wheezing. 01/13/11 01/06/14  Vanetta MuldersScott Zackowski, MD  cephALEXin (KEFLEX) 500 MG capsule Take 1 capsule (500 mg total) by mouth 4 (four) times daily. 01/06/14   Einar GipWilliam Duncan Christop Hippert, PA-C  oxyCODONE-acetaminophen (PERCOCET/ROXICET) 5-325 MG per tablet Take 1 tablet by mouth every 6 (six) hours as needed for severe pain. Patient not taking: Reported on 01/06/2014 11/30/13   Jamesetta Orleanshristopher W Lawyer, PA-C  polyethylene glycol powder (GLYCOLAX/MIRALAX) powder Take 17 g by mouth 2 (two) times daily. Until daily soft stools  OTC 01/06/14   Einar GipWilliam Duncan Savio Albrecht, PA-C   BP 147/69 mmHg  Pulse 78  Temp(Src) 98.6 F (37 C) (Oral)  Resp 19  SpO2 99% Physical Exam  Constitutional: She appears well-developed and well-nourished. No distress.  HENT:  Head: Normocephalic and atraumatic.  Mouth/Throat: Oropharynx is clear and moist. No oropharyngeal exudate.  Eyes: Conjunctivae are normal. Pupils are equal, round, and reactive to light. Right eye exhibits no discharge. Left eye exhibits no discharge.  Neck: Neck supple.  Cardiovascular: Normal rate, regular rhythm, normal heart sounds and intact distal pulses.  Exam reveals no gallop and no friction rub.   No murmur heard. Pulmonary/Chest: Effort  normal and breath sounds normal. No respiratory distress. She has no wheezes. She has no rales.  Abdominal: Soft. Bowel sounds are normal. She exhibits no distension and no mass. There is no tenderness. There is no rebound and no guarding.  Patient's abdomen is soft. The patient's abdomen is nontender to palpation. Negative psoas and obturator sign. No CVA tenderness.   Genitourinary:  Digital rectal exam performed by me with female nursing tech chaperone. The patient's anal center tone is normal. The patient has a small external hemorrhoid that is not thrombosed. There is no evidence of prolapsed hemorrhoids. No evidence of thrombosed hemorrhoids. There is no gross blood in stool.   Musculoskeletal: She exhibits no edema.  Lymphadenopathy:    She has no cervical adenopathy.  Neurological: She is alert. Coordination normal.  Skin: Skin is warm and dry. No rash noted. She is not diaphoretic. No erythema. No pallor.  Psychiatric: She has a normal mood and affect. Her behavior is normal.  Nursing note and vitals reviewed.   ED Course  Procedures (including critical care time) Labs Review Labs Reviewed  URINALYSIS, ROUTINE W REFLEX MICROSCOPIC - Abnormal; Notable for the following:    APPearance TURBID (*)    Nitrite  POSITIVE (*)    Leukocytes, UA LARGE (*)    All other components within normal limits  URINE MICROSCOPIC-ADD ON - Abnormal; Notable for the following:    Bacteria, UA MANY (*)    All other components within normal limits    Imaging Review No results found.   EKG Interpretation None     Filed Vitals:   01/06/14 1346 01/06/14 1550  BP: 150/99 147/69  Pulse: 70 78  Temp: 98.6 F (37 C)   TempSrc: Oral   Resp: 18 19  SpO2: 100% 99%    MDM   Meds given in ED:  Medications  acetaminophen (TYLENOL) tablet 650 mg (650 mg Oral Given 01/06/14 1554)  cephALEXin (KEFLEX) capsule 500 mg (500 mg Oral Given 01/06/14 1722)    Discharge Medication List as of 01/06/2014   4:32 PM    START taking these medications   Details  cephALEXin (KEFLEX) 500 MG capsule Take 1 capsule (500 mg total) by mouth 4 (four) times daily., Starting 01/06/2014, Until Discontinued, Print    polyethylene glycol powder (GLYCOLAX/MIRALAX) powder Take 17 g by mouth 2 (two) times daily. Until daily soft stools  OTC, Starting 01/06/2014, Until Discontinued, Print        Final diagnoses:  Hemorrhoids, unspecified hemorrhoid type  UTI (lower urinary tract infection)   Kathleen Herrera is a 53 y.o. female with a straight hypertension who presents the emergency complaining of 4 days of constipation and pain while having bowel movements and urgency of urination. Patient reports she's been having hard stools and straining to stool over the past 4 days. Patient has noticed hemorrhoids that will prolapse when straining to stool. The hemorrhoids do not remain prolapsed. Patient is afebrile and nontoxic appearing. On digital rectal exam the patient does have a small external hemorrhoid that is not thrombosed. No prolapsed hemorrhoids. Patient's anal sphincter tone is normal. No gross blood in stool.  Patient's urinalysis returned indicating a urinary tract infection. We'll treat this patient with Keflex. Patient is given her first dose of Keflex in the ED prior to discharge. Advised the patient she needs to be seen in follow-up with her primary care physician this week. I advised the patient to return to the emergency department with new or worsening symptoms or new concerns. Patient verbalized understanding and agreement pain.  Patient was discussed with Dr. Littie Deeds who agrees with assessment and plan.      Lawana Chambers, PA-C 01/06/14 1732  Mirian Mo, MD 01/10/14 [phone]

## 2014-01-06 NOTE — Discharge Instructions (Signed)
Hemorrhoids Hemorrhoids are swollen veins around the rectum or anus. There are two types of hemorrhoids:   Internal hemorrhoids. These occur in the veins just inside the rectum. They may poke through to the outside and become irritated and painful.  External hemorrhoids. These occur in the veins outside the anus and can be felt as a painful swelling or hard lump near the anus. CAUSES  Pregnancy.   Obesity.   Constipation or diarrhea.   Straining to have a bowel movement.   Sitting for long periods on the toilet.  Heavy lifting or other activity that caused you to strain.  Anal intercourse. SYMPTOMS   Pain.   Anal itching or irritation.   Rectal bleeding.   Fecal leakage.   Anal swelling.   One or more lumps around the anus.  DIAGNOSIS  Your caregiver may be able to diagnose hemorrhoids by visual examination. Other examinations or tests that may be performed include:   Examination of the rectal area with a gloved hand (digital rectal exam).   Examination of anal canal using a small tube (scope).   A blood test if you have lost a significant amount of blood.  A test to look inside the colon (sigmoidoscopy or colonoscopy). TREATMENT Most hemorrhoids can be treated at home. However, if symptoms do not seem to be getting better or if you have a lot of rectal bleeding, your caregiver may perform a procedure to help make the hemorrhoids get smaller or remove them completely. Possible treatments include:   Placing a rubber band at the base of the hemorrhoid to cut off the circulation (rubber band ligation).   Injecting a chemical to shrink the hemorrhoid (sclerotherapy).   Using a tool to burn the hemorrhoid (infrared light therapy).   Surgically removing the hemorrhoid (hemorrhoidectomy).   Stapling the hemorrhoid to block blood flow to the tissue (hemorrhoid stapling).  HOME CARE INSTRUCTIONS   Eat foods with fiber, such as whole grains, beans,  nuts, fruits, and vegetables. Ask your doctor about taking products with added fiber in them (fibersupplements).  Increase fluid intake. Drink enough water and fluids to keep your urine clear or pale yellow.   Exercise regularly.   Go to the bathroom when you have the urge to have a bowel movement. Do not wait.   Avoid straining to have bowel movements.   Keep the anal area dry and clean. Use wet toilet paper or moist towelettes after a bowel movement.   Medicated creams and suppositories may be used or applied as directed.   Only take over-the-counter or prescription medicines as directed by your caregiver.   Take warm sitz baths for 15-20 minutes, 3-4 times a day to ease pain and discomfort.   Place ice packs on the hemorrhoids if they are tender and swollen. Using ice packs between sitz baths may be helpful.   Put ice in a plastic bag.   Place a towel between your skin and the bag.   Leave the ice on for 15-20 minutes, 3-4 times a day.   Do not use a donut-shaped pillow or sit on the toilet for long periods. This increases blood pooling and pain.  SEEK MEDICAL CARE IF:  You have increasing pain and swelling that is not controlled by treatment or medicine.  You have uncontrolled bleeding.  You have difficulty or you are unable to have a bowel movement.  You have pain or inflammation outside the area of the hemorrhoids. MAKE SURE YOU:  Understand these instructions.    Will watch your condition.  Will get help right away if you are not doing well or get worse. Document Released: 01/20/2000 Document Revised: 01/09/2012 Document Reviewed: 11/27/2011 Exit[employer] Patient Information 2015 [employer]. This information is not intended to replace advice given to you by your health care provider. Make sure you discuss any questions you have with your health care provider.  Urinary Tract Infection Urinary tract infections (UTIs) can develop anywhere along your  urinary tract. Your urinary tract is your body's drainage system for removing wastes and extra water. Your urinary tract includes two kidneys, two ureters, a bladder, and a urethra. Your kidneys are a pair of bean-shaped organs. Each kidney is about the size of your fist. They are located below your ribs, one on each side of your spine. CAUSES Infections are caused by microbes, which are microscopic organisms, including fungi, viruses, and bacteria. These organisms are so small that they can only be seen through a microscope. Bacteria are the microbes that most commonly cause UTIs. SYMPTOMS  Symptoms of UTIs may vary by age and gender of the patient and by the location of the infection. Symptoms in young women typically include a frequent and intense urge to urinate and a painful, burning feeling in the bladder or urethra during urination. Older women and men are more likely to be tired, shaky, and weak and have muscle aches and abdominal pain. A fever may mean the infection is in your kidneys. Other symptoms of a kidney infection include pain in your back or sides below the ribs, nausea, and vomiting. DIAGNOSIS To diagnose a UTI, your caregiver will ask you about your symptoms. Your caregiver also will ask to provide a urine sample. The urine sample will be tested for bacteria and white blood cells. White blood cells are made by your body to help fight infection. TREATMENT  Typically, UTIs can be treated with medication. Because most UTIs are caused by a bacterial infection, they usually can be treated with the use of antibiotics. The choice of antibiotic and length of treatment depend on your symptoms and the type of bacteria causing your infection. HOME CARE INSTRUCTIONS  If you were prescribed antibiotics, take them exactly as your caregiver instructs you. Finish the medication even if you feel better after you have only taken some of the medication.  Drink enough water and fluids to keep your urine  clear or pale yellow.  Avoid caffeine, tea, and carbonated beverages. They tend to irritate your bladder.  Empty your bladder often. Avoid holding urine for long periods of time.  Empty your bladder before and after sexual intercourse.  After a bowel movement, women should cleanse from front to back. Use each tissue only once. SEEK MEDICAL CARE IF:   You have back pain.  You develop a fever.  Your symptoms do not begin to resolve within 3 days. SEEK IMMEDIATE MEDICAL CARE IF:   You have severe back pain or lower abdominal pain.  You develop chills.  You have nausea or vomiting.  You have continued burning or discomfort with urination. MAKE SURE YOU:   Understand these instructions.  Will watch your condition.  Will get help right away if you are not doing well or get worse. Document Released: 11/01/2004 Document Revised: 07/24/2011 Document Reviewed: 03/02/2011 ExitCare Patient Information 2015 [employer]. This information is not intended to replace advice given to you by your health care provider. Make sure you discuss any questions you have with your health care provider.   

## 2014-01-21 ENCOUNTER — Emergency Department (HOSPITAL_COMMUNITY)
Admission: EM | Admit: 2014-01-21 | Discharge: 2014-01-21 | Disposition: A | Discharge status: Home / Self Care | Payer: Medicaid Other | Attending: Emergency Medicine | Admitting: Emergency Medicine

## 2014-01-21 ENCOUNTER — Emergency Department (HOSPITAL_COMMUNITY): Payer: Medicaid Other

## 2014-01-21 ENCOUNTER — Encounter (HOSPITAL_COMMUNITY): Payer: Self-pay | Admitting: Emergency Medicine

## 2014-01-21 DIAGNOSIS — M549 Dorsalgia, unspecified: Secondary | ICD-10-CM

## 2014-01-21 DIAGNOSIS — Z859 Personal history of malignant neoplasm, unspecified: Secondary | ICD-10-CM | POA: Diagnosis not present

## 2014-01-21 DIAGNOSIS — M546 Pain in thoracic spine: Secondary | ICD-10-CM | POA: Diagnosis present

## 2014-01-21 DIAGNOSIS — K5900 Constipation, unspecified: Secondary | ICD-10-CM | POA: Diagnosis not present

## 2014-01-21 DIAGNOSIS — M545 Low back pain: Secondary | ICD-10-CM | POA: Insufficient documentation

## 2014-01-21 DIAGNOSIS — Z792 Long term (current) use of antibiotics: Secondary | ICD-10-CM | POA: Insufficient documentation

## 2014-01-21 DIAGNOSIS — Z720 Tobacco use: Secondary | ICD-10-CM | POA: Diagnosis not present

## 2014-01-21 DIAGNOSIS — G8929 Other chronic pain: Secondary | ICD-10-CM | POA: Diagnosis not present

## 2014-01-21 DIAGNOSIS — F329 Major depressive disorder, single episode, unspecified: Secondary | ICD-10-CM | POA: Insufficient documentation

## 2014-01-21 DIAGNOSIS — Z79899 Other long term (current) drug therapy: Secondary | ICD-10-CM | POA: Insufficient documentation

## 2014-01-21 DIAGNOSIS — K5909 Other constipation: Secondary | ICD-10-CM

## 2014-01-21 DIAGNOSIS — Z8639 Personal history of other endocrine, nutritional and metabolic disease: Secondary | ICD-10-CM | POA: Diagnosis not present

## 2014-01-21 DIAGNOSIS — M1990 Unspecified osteoarthritis, unspecified site: Secondary | ICD-10-CM | POA: Diagnosis not present

## 2014-01-21 DIAGNOSIS — Z8709 Personal history of other diseases of the respiratory system: Secondary | ICD-10-CM | POA: Diagnosis not present

## 2014-01-21 DIAGNOSIS — F4310 Post-traumatic stress disorder, unspecified: Secondary | ICD-10-CM | POA: Insufficient documentation

## 2014-01-21 DIAGNOSIS — R109 Unspecified abdominal pain: Secondary | ICD-10-CM

## 2014-01-21 LAB — COMPREHENSIVE METABOLIC PANEL
ALT: 57 U/L — ABNORMAL HIGH (ref 0–35)
AST: 64 U/L — ABNORMAL HIGH (ref 0–37)
Albumin: 3.9 g/dL (ref 3.5–5.2)
Alkaline Phosphatase: 72 U/L (ref 39–117)
Anion gap: 15 (ref 5–15)
BILIRUBIN TOTAL: 0.5 mg/dL (ref 0.3–1.2)
BUN: 10 mg/dL (ref 6–23)
CHLORIDE: 102 meq/L (ref 96–112)
CO2: 21 meq/L (ref 19–32)
CREATININE: 0.91 mg/dL (ref 0.50–1.10)
Calcium: 9.7 mg/dL (ref 8.4–10.5)
GFR, EST AFRICAN AMERICAN: 81 mL/min — AB (ref >90)
GFR, EST NON AFRICAN AMERICAN: 70 mL/min — AB (ref >90)
GLUCOSE: 118 mg/dL — AB (ref 70–99)
Potassium: 3.8 mEq/L (ref 3.7–5.3)
Sodium: 138 mEq/L (ref 137–147)
Total Protein: 7.9 g/dL (ref 6.0–8.3)

## 2014-01-21 LAB — CBC WITH DIFFERENTIAL/PLATELET
BASOS ABS: 0 10*3/uL (ref 0.0–0.1)
Basophils Relative: 0 % (ref 0–1)
EOS PCT: 1 % (ref 0–5)
Eosinophils Absolute: 0.1 10*3/uL (ref 0.0–0.7)
HCT: 42.2 % (ref 36.0–46.0)
Hemoglobin: 13.8 g/dL (ref 12.0–15.0)
LYMPHS ABS: 3 10*3/uL (ref 0.7–4.0)
Lymphocytes Relative: 45 % (ref 12–46)
MCH: 26 pg (ref 26.0–34.0)
MCHC: 32.7 g/dL (ref 30.0–36.0)
MCV: 79.6 fL (ref 78.0–100.0)
Monocytes Absolute: 0.8 10*3/uL (ref 0.1–1.0)
Monocytes Relative: 12 % (ref 3–12)
NEUTROS PCT: 42 % — AB (ref 43–77)
Neutro Abs: 2.8 10*3/uL (ref 1.7–7.7)
PLATELETS: 229 10*3/uL (ref 150–400)
RBC: 5.3 MIL/uL — AB (ref 3.87–5.11)
RDW: 15.3 % (ref 11.5–15.5)
WBC: 6.7 10*3/uL (ref 4.0–10.5)

## 2014-01-21 LAB — LIPASE, BLOOD: Lipase: 28 U/L (ref 11–59)

## 2014-01-21 MED ORDER — IOHEXOL 300 MG/ML  SOLN: 100.0000 mL | Freq: Once | INTRAMUSCULAR | Status: DC | PRN

## 2014-01-21 MED ORDER — MAGNESIUM CITRATE PO SOLN: 1.0000 | Freq: Once | ORAL | Status: DC

## 2014-01-21 MED ORDER — HYDROCODONE-ACETAMINOPHEN 5-325 MG PO TABS
2.0000 | ORAL_TABLET | Freq: Once | ORAL | Status: AC
  Administered 2014-01-21: 2 via ORAL
  Filled 2014-01-21: qty 2

## 2014-01-21 NOTE — ED Provider Notes (Signed)
CSN: [id number]     Arrival date & time 01/21/14  1611 History   First MD Initiated Contact with Patient 01/21/14 1651     Chief Complaint  Patient presents with  . Back Pain    The history is provided by the patient.   [name] presents for evaluation of incontinence and back pain. She refers that she's had mid back pain since a fall back on October 26. The pain is located in her central back and wraps around to her abdomen. The pain is constant but she occasionally have some waves that feel like muscle spasms. The last several weeks she has developed stool incontinence. She denies any diarrhea or constipation. She goes to the bathroom and she is unable to void but then when she gets back to bed she feels a spasm in her back and she stooled herself. She denies any fevers. She has poor appetite no vomiting no dysuria. She was seen in the emergency department 2 weeks ago for similar symptoms. She denies any weakness or numbness in her legs. She has a history of fibromyalgia and has chronic pain related to this and has had a caregiver for the last year to help her with ADLs at home.  Past Medical History  Diagnosis Date  . Cancer   . Hyperlipemia   . Arthritis   . Sinus congestion   . Depression   . Post traumatic stress disorder   . Bronchitis    No past surgical history on file. No family history on file. History  Substance Use Topics  . Smoking status: Current Every Day Smoker    Types: Cigarettes  . Smokeless tobacco: Not on file  . Alcohol Use: Yes     Comment: occasionally   OB History    No data available     Review of Systems  All other systems reviewed and are negative.     Allergies  Review of patient's allergies indicates no known allergies.  Home Medications   Prior to Admission medications   Medication Sig Start Date End Date Taking? Authorizing Provider  amitriptyline (ELAVIL) 100 MG tablet Take 100 mg by mouth at bedtime.   Yes Historical Provider, MD   cephALEXin (KEFLEX) 500 MG capsule Take 1 capsule (500 mg total) by mouth 4 (four) times daily. 01/06/14  Yes Einar Gip Dansie, PA-C  cycloSPORINE (RESTASIS) 0.05 % ophthalmic emulsion Place 1 drop into both eyes every 12 (twelve) hours.   Yes Historical Provider, MD  docusate sodium (COLACE) 100 MG capsule Take 1 capsule (100 mg total) by mouth every 12 (twelve) hours. 11/30/13  Yes Jamesetta Orleans Lawyer, PA-C  gabapentin (NEURONTIN) 300 MG capsule Take 300 mg by mouth 4 (four) times daily.   Yes Historical Provider, MD  hydrOXYzine (VISTARIL) 50 MG capsule Take 50 mg by mouth 3 (three) times daily.   Yes Historical Provider, MD  metoprolol succinate (TOPROL-XL) 50 MG 24 hr tablet Take 100 mg by mouth daily. Take with or immediately following a meal.   Yes Historical Provider, MD  pantoprazole (PROTONIX) 40 MG tablet Take 40 mg by mouth 2 (two) times daily.   Yes Historical Provider, MD  PARoxetine (PAXIL) 20 MG tablet Take 20 mg by mouth 2 (two) times daily.   Yes Historical Provider, MD  QUEtiapine (SEROQUEL XR) 300 MG 24 hr tablet Take 300 mg by mouth daily.   Yes Historical Provider, MD  spironolactone (ALDACTONE) 50 MG tablet Take 50 mg by mouth daily.  Yes Historical Provider, MD  albuterol (PROVENTIL HFA;VENTOLIN HFA) 108 (90 BASE) MCG/ACT inhaler Inhale 1-2 puffs into the lungs every 6 (six) hours as needed for wheezing. Patient not taking: Reported on 01/21/2014 01/13/11 01/06/14  Vanetta MuldersScott Zackowski, MD  glycerin adult (GLYCERIN ADULT) 2 G SUPP Place 1 suppository rectally once. Patient taking differently: Place 1 suppository rectally daily as needed for moderate constipation or severe constipation.  11/30/13   Jamesetta Orleanshristopher W Lawyer, PA-C  oxyCODONE-acetaminophen (PERCOCET/ROXICET) 5-325 MG per tablet Take 1 tablet by mouth every 6 (six) hours as needed for severe pain. 11/30/13   Jamesetta Orleanshristopher W Lawyer, PA-C  polyethylene glycol powder (GLYCOLAX/MIRALAX) powder Take 17 g by mouth 2 (two)  times daily. Until daily soft stools  OTC 01/06/14   Einar GipWilliam Duncan Dansie, PA-C   BP 117/72 mmHg  Pulse 70  Temp(Src) 98.2 F (36.8 C) (Oral)  Resp 20  SpO2 100% Physical Exam  Constitutional: She is oriented to person, place, and time. She appears well-developed and well-nourished.  HENT:  Head: Normocephalic and atraumatic.  Cardiovascular: Normal rate and regular rhythm.   No murmur heard. Pulmonary/Chest: Effort normal and breath sounds normal. No respiratory distress.  Abdominal: Soft. There is no rebound and no guarding.  Mild diffuse abdominal tenderness  Genitourinary:  Rectal exam with brown stool, normal tone  Musculoskeletal: She exhibits no edema or tenderness.  TTP over upper lumbar/lower thoracic back  Neurological: She is alert and oriented to person, place, and time.  5/5 strength in BLE with sensation to light touch bilateral lower extremities.  No saddle anesthesia  Skin: Skin is warm and dry.  Psychiatric: She has a normal mood and affect. Her behavior is normal.  Nursing note and vitals reviewed.   ED Course  Procedures (including critical care time) Labs Review Labs Reviewed  COMPREHENSIVE METABOLIC PANEL - Abnormal; Notable for the following:    Glucose, Bld 118 (*)    AST 64 (*)    ALT 57 (*)    GFR calc non Af Amer 70 (*)    GFR calc Af Amer 81 (*)    All other components within normal limits  CBC WITH DIFFERENTIAL - Abnormal; Notable for the following:    RBC 5.30 (*)    Neutrophils Relative % 42 (*)    All other components within normal limits  LIPASE, BLOOD  URINALYSIS, ROUTINE W REFLEX MICROSCOPIC    Imaging Review Ct Abdomen Pelvis Wo Contrast  01/21/2014   CLINICAL DATA:  Low back pain with fecal in urinary incontinence. Low pelvic pressure.  EXAM: CT ABDOMEN AND PELVIS WITHOUT CONTRAST  TECHNIQUE: Multidetector CT imaging of the abdomen and pelvis was performed following the standard protocol without IV contrast.  COMPARISON:  None.   FINDINGS: BODY WALL: Unremarkable.  LOWER CHEST: Unremarkable.  ABDOMEN/PELVIS:  Liver: No focal abnormality.  Biliary: No evidence of biliary obstruction or stone.  Pancreas: Unremarkable.  Spleen: Unremarkable.  Adrenals: Unremarkable.  Kidneys and ureters: No hydronephrosis or stone.  Bladder: Limited evaluation due to decompressed state.  Reproductive: Hysterectomy.  No adnexal mass.  Bowel: Prominent volume of distal colonic stool, with rectal distention to 7 cm. this could explain the patient's low pelvic pressure. There is no bowel obstruction. No inflammatory bowel wall thickening Normal appendix.  Retroperitoneum: No mass or adenopathy.  Peritoneum: No ascites or pneumoperitoneum.  Vascular: No acute abnormality.  OSSEOUS: Lower lumbar facet arthropathy with overgrowth.  IMPRESSION: Probable constipation and fecal impaction.   Electronically Signed   By: Christiane HaJonathan  Watts M.D.   On: 01/21/2014 21:55     EKG Interpretation None      MDM   Final diagnoses:  Chronic back pain  Other constipation    Patient presents for evaluation of chronic back pain and fecal incontinence. In terms of her back pain and this is not consistent with cauda equina, epidural abscess, serious bacterial infection. Patient is neurologically intact on exam. Patient with fecal impaction on CT scan which is not able to be reached on rectal examination, patient provided enema in the emergency department with improvement in her symptoms. Discussed with patient importance of treating constipation at home. Discussed with patient taking magnesium citrate followed by MiraLAX daily. Return precautions discussed.    Tilden FossaElizabeth Willetta York, MD 01/21/14 [phone]

## 2014-01-21 NOTE — Discharge Instructions (Signed)
Your CT scan showed that you are very constipated.  Take miralax, available over the counter one to two times daily until you have multiple regular bowel movements. Take the magnesium citrate solution tonight.    Back Pain, Adult Low back pain is very common. About 1 in 5 people have back pain.The cause of low back pain is rarely dangerous. The pain often gets better over time.About half of people with a sudden onset of back pain feel better in just 2 weeks. About 8 in 10 people feel better by 6 weeks.  CAUSES Some common causes of back pain include:  Strain of the muscles or ligaments supporting the spine.  Wear and tear (degeneration) of the spinal discs.  Arthritis.  Direct injury to the back. DIAGNOSIS Most of the time, the direct cause of low back pain is not known.However, back pain can be treated effectively even when the exact cause of the pain is unknown.Answering your caregiver's questions about your overall health and symptoms is one of the most accurate ways to make sure the cause of your pain is not dangerous. If your caregiver needs more information, he or she may order lab work or imaging tests (X-rays or MRIs).However, even if imaging tests show changes in your back, this usually does not require surgery. HOME CARE INSTRUCTIONS For many people, back pain returns.Since low back pain is rarely dangerous, it is often a condition that people can learn to [facility] own.   Remain active. It is stressful on the back to sit or stand in one place. Do not sit, drive, or stand in one place for more than 30 minutes at a time. Take short walks on level surfaces as soon as pain allows.Try to increase the length of time you walk each day.  Do not stay in bed.Resting more than 1 or 2 days can delay your recovery.  Do not avoid exercise or work.Your body is made to move.It is not dangerous to be active, even though your back may hurt.Your back will likely heal faster if you  return to being active before your pain is gone.  Pay attention to your body when you bend and lift. Many people have less discomfortwhen lifting if they bend their knees, keep the load close to their bodies,and avoid twisting. Often, the most comfortable positions are those that put less stress on your recovering back.  Find a comfortable position to sleep. Use a firm mattress and lie on your side with your knees slightly bent. If you lie on your back, put a pillow under your knees.  Only take over-the-counter or prescription medicines as directed by your caregiver. Over-the-counter medicines to reduce pain and inflammation are often the most helpful.Your caregiver may prescribe muscle relaxant drugs.These medicines help dull your pain so you can more quickly return to your normal activities and healthy exercise.  Put ice on the injured area.  Put ice in a plastic bag.  Place a towel between your skin and the bag.  Leave the ice on for 15-20 minutes, 03-04 times a day for the first 2 to 3 days. After that, ice and heat may be alternated to reduce pain and spasms.  Ask your caregiver about trying back exercises and gentle massage. This may be of some benefit.  Avoid feeling anxious or stressed.Stress increases muscle tension and can worsen back pain.It is important to recognize when you are anxious or stressed and learn ways to manage it.Exercise is a great option. SEEK MEDICAL CARE  IF:  You have pain that is not relieved with rest or medicine.  You have pain that does not improve in 1 week.  You have new symptoms.  You are generally not feeling well. SEEK IMMEDIATE MEDICAL CARE IF:   You have pain that radiates from your back into your legs.  You develop new bowel or bladder control problems.  You have unusual weakness or numbness in your arms or legs.  You develop nausea or vomiting.  You develop abdominal pain.  You feel faint. Document Released: 01/22/2005  Document Revised: 07/24/2011 Document Reviewed: 05/26/2013 [facility] Patient Information 2015 [place], [place]. This information is not intended to replace advice given to you by your health care provider. Make sure you discuss any questions you have with your health care provider. Constipation Constipation is when a person has fewer than three bowel movements a week, has difficulty having a bowel movement, or has stools that are dry, hard, or larger than normal. As people grow older, constipation is more common. If you try to fix constipation with medicines that make you have a bowel movement (laxatives), the problem may get worse. Long-term laxative use may cause the muscles of the colon to become weak. A low-fiber diet, not taking in enough fluids, and taking certain medicines may make constipation worse.  CAUSES   Certain medicines, such as antidepressants, pain medicine, iron supplements, antacids, and water pills.   Certain diseases, such as diabetes, irritable bowel syndrome (IBS), thyroid disease, or depression.   Not drinking enough water.   Not eating enough fiber-rich foods.   Stress or travel.   Lack of physical activity or exercise.   Ignoring the urge to have a bowel movement.   Using laxatives too much.  SIGNS AND SYMPTOMS   Having fewer than three bowel movements a week.   Straining to have a bowel movement.   Having stools that are hard, dry, or larger than normal.   Feeling full or bloated.   Pain in the lower abdomen.   Not feeling relief after having a bowel movement.  DIAGNOSIS  Your health care provider will take a medical history and perform a physical exam. Further testing may be done for severe constipation. Some tests may include:  A barium enema X-ray to examine your rectum, colon, and, sometimes, your small intestine.   A sigmoidoscopy to examine your lower colon.   A colonoscopy to examine your entire colon. TREATMENT  Treatment  will depend on the severity of your constipation and what is causing it. Some dietary treatments include drinking more fluids and eating more fiber-rich foods. Lifestyle treatments may include regular exercise. If these diet and lifestyle recommendations do not help, your health care provider may recommend taking over-the-counter laxative medicines to help you have bowel movements. Prescription medicines may be prescribed if over-the-counter medicines do not work.  HOME CARE INSTRUCTIONS   Eat foods that have a lot of fiber, such as fruits, vegetables, whole grains, and beans.  Limit foods high in fat and processed sugars, such as french fries, hamburgers, cookies, candies, and soda.   A fiber supplement may be added to your diet if you cannot get enough fiber from foods.   Drink enough fluids to keep your urine clear or pale yellow.   Exercise regularly or as directed by your health care provider.   Go to the restroom when you have the urge to go. Do not hold it.   Only take over-the-counter or prescription medicines as directed by  your health care provider. Do not take other medicines for constipation without talking to your health care provider first.  SEEK IMMEDIATE MEDICAL CARE IF:   You have bright red blood in your stool.   Your constipation lasts for more than 4 days or gets worse.   You have abdominal or rectal pain.   You have thin, pencil-like stools.   You have unexplained weight loss. MAKE SURE YOU:   Understand these instructions.  Will watch your condition.  Will get help right away if you are not doing well or get worse. Document Released: 10/21/2003 Document Revised: 01/27/2013 Document Reviewed: 11/03/2012 [facility] Patient Information 2015 [place], [place]. This information is not intended to replace advice given to you by your health care provider. Make sure you discuss any questions you have with your health care provider.

## 2014-01-21 NOTE — ED Notes (Signed)
Per ems pt from home, pt co lower back pain. Hx chronic back pain. Pt brought feces in bag for "test purposes". Pt ambulates with assistance of cane. Per ems pt reports that she is incontinent for feces when severe pain. Hx of behavioral disorder.

## 2014-01-21 NOTE — ED Notes (Signed)
Pt does not have to urinate at this time.  

## 2014-01-21 NOTE — ED Notes (Signed)
Bed: [id number] Expected date:  Expected time:  Means of arrival:  Comments: Back pain, "uncontrollable defecation"

## 2014-05-03 ENCOUNTER — Ambulatory Visit: Payer: PRIVATE HEALTH INSURANCE | Admitting: Internal Medicine

## 2014-06-28 ENCOUNTER — Encounter: Payer: Self-pay | Admitting: Internal Medicine

## 2014-06-28 ENCOUNTER — Ambulatory Visit (INDEPENDENT_AMBULATORY_CARE_PROVIDER_SITE_OTHER): Payer: Medicaid Other | Admitting: Internal Medicine

## 2014-06-28 VITALS — BP 150/78 | HR 96 | Temp 98.2°F | Wt 254.0 lb

## 2014-06-28 DIAGNOSIS — R269 Unspecified abnormalities of gait and mobility: Secondary | ICD-10-CM

## 2014-06-28 DIAGNOSIS — R000 Tachycardia, unspecified: Secondary | ICD-10-CM

## 2014-06-28 DIAGNOSIS — G4719 Other hypersomnia: Secondary | ICD-10-CM

## 2014-06-28 DIAGNOSIS — E785 Hyperlipidemia, unspecified: Secondary | ICD-10-CM | POA: Diagnosis not present

## 2014-06-28 DIAGNOSIS — Z87898 Personal history of other specified conditions: Secondary | ICD-10-CM

## 2014-06-28 DIAGNOSIS — F3130 Bipolar disorder, current episode depressed, mild or moderate severity, unspecified: Secondary | ICD-10-CM

## 2014-06-28 DIAGNOSIS — I10 Essential (primary) hypertension: Secondary | ICD-10-CM

## 2014-06-28 DIAGNOSIS — E8881 Metabolic syndrome: Secondary | ICD-10-CM

## 2014-06-28 DIAGNOSIS — K219 Gastro-esophageal reflux disease without esophagitis: Secondary | ICD-10-CM

## 2014-06-28 DIAGNOSIS — Z9189 Other specified personal risk factors, not elsewhere classified: Secondary | ICD-10-CM

## 2014-06-28 DIAGNOSIS — F3181 Bipolar II disorder: Secondary | ICD-10-CM

## 2014-06-28 DIAGNOSIS — Z79899 Other long term (current) drug therapy: Secondary | ICD-10-CM

## 2014-06-28 DIAGNOSIS — M792 Neuralgia and neuritis, unspecified: Secondary | ICD-10-CM

## 2014-06-28 LAB — LIPID PANEL
CHOLESTEROL: 370 mg/dL — AB (ref 0–200)
HDL: 90 mg/dL (ref >=46)
LDL Cholesterol: 238 mg/dL — ABNORMAL HIGH (ref 0–99)
TRIGLYCERIDES: 209 mg/dL — AB (ref <150)
Total CHOL/HDL Ratio: 4.1 Ratio
VLDL: 42 mg/dL — ABNORMAL HIGH (ref 0–40)

## 2014-06-28 LAB — COMPREHENSIVE METABOLIC PANEL
ALT: 72 U/L — ABNORMAL HIGH (ref 0–35)
AST: 90 U/L — ABNORMAL HIGH (ref 0–37)
Albumin: 4.4 g/dL (ref 3.5–5.2)
Alkaline Phosphatase: 81 U/L (ref 39–117)
BUN: 12 mg/dL (ref 6–23)
CO2: 23 meq/L (ref 19–32)
Calcium: 9.7 mg/dL (ref 8.4–10.5)
Chloride: 101 mEq/L (ref 96–112)
Creat: 0.89 mg/dL (ref 0.50–1.10)
Glucose, Bld: 91 mg/dL (ref 70–99)
POTASSIUM: 3.9 meq/L (ref 3.5–5.3)
Sodium: 136 mEq/L (ref 135–145)
Total Bilirubin: 0.6 mg/dL (ref 0.2–1.2)
Total Protein: 8 g/dL (ref 6.0–8.3)

## 2014-06-28 LAB — CBC WITH DIFFERENTIAL/PLATELET
Basophils Absolute: 0 10*3/uL (ref 0.0–0.1)
Basophils Relative: 0 % (ref 0–1)
Eosinophils Absolute: 0.1 10*3/uL (ref 0.0–0.7)
Eosinophils Relative: 2 % (ref 0–5)
HCT: 44.9 % (ref 36.0–46.0)
Hemoglobin: 14.7 g/dL (ref 12.0–15.0)
LYMPHS PCT: 49 % — AB (ref 12–46)
Lymphs Abs: 2.9 10*3/uL (ref 0.7–4.0)
MCH: 25.9 pg — ABNORMAL LOW (ref 26.0–34.0)
MCHC: 32.7 g/dL (ref 30.0–36.0)
MCV: 79 fL (ref 78.0–100.0)
MPV: 9.3 fL (ref 8.6–12.4)
Monocytes Absolute: 0.4 10*3/uL (ref 0.1–1.0)
Monocytes Relative: 6 % (ref 3–12)
NEUTROS ABS: 2.6 10*3/uL (ref 1.7–7.7)
NEUTROS PCT: 43 % (ref 43–77)
Platelets: 230 10*3/uL (ref 150–400)
RBC: 5.68 MIL/uL — ABNORMAL HIGH (ref 3.87–5.11)
RDW: 15.1 % (ref 11.5–15.5)
WBC: 6 10*3/uL (ref 4.0–10.5)

## 2014-06-28 LAB — HEMOGLOBIN A1C
Hgb A1c MFr Bld: 6.9 % — ABNORMAL HIGH (ref <5.7)
Mean Plasma Glucose: 151 mg/dL — ABNORMAL HIGH (ref <117)

## 2014-06-28 MED ORDER — METOPROLOL SUCCINATE ER 50 MG PO TB24: 100.0000 mg | ORAL_TABLET | Freq: Every day | ORAL | Status: DC

## 2014-06-28 MED ORDER — ADJUSTABLE ALUMINUM CANE MISC: 1.0000 | Freq: Once | Status: DC

## 2014-06-28 MED ORDER — OMEPRAZOLE 40 MG PO CPDR: 40.0000 mg | DELAYED_RELEASE_CAPSULE | Freq: Every day | ORAL | Status: DC

## 2014-06-28 MED ORDER — GABAPENTIN 300 MG PO CAPS: 300.0000 mg | ORAL_CAPSULE | Freq: Four times a day (QID) | ORAL | Status: DC

## 2014-06-28 MED ORDER — AMLODIPINE BESYLATE 5 MG PO TABS: 5.0000 mg | ORAL_TABLET | Freq: Every day | ORAL | Status: DC
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
## 2014-06-29 ENCOUNTER — Encounter: Payer: Self-pay | Admitting: Internal Medicine

## 2014-06-29 DIAGNOSIS — G4719 Other hypersomnia: Secondary | ICD-10-CM | POA: Insufficient documentation

## 2014-06-29 DIAGNOSIS — R000 Tachycardia, unspecified: Secondary | ICD-10-CM | POA: Insufficient documentation

## 2014-06-29 DIAGNOSIS — E8881 Metabolic syndrome: Secondary | ICD-10-CM | POA: Insufficient documentation

## 2014-06-29 DIAGNOSIS — K219 Gastro-esophageal reflux disease without esophagitis: Secondary | ICD-10-CM | POA: Insufficient documentation

## 2014-06-29 DIAGNOSIS — Z9189 Other specified personal risk factors, not elsewhere classified: Secondary | ICD-10-CM | POA: Insufficient documentation

## 2014-06-29 DIAGNOSIS — R269 Unspecified abnormalities of gait and mobility: Secondary | ICD-10-CM | POA: Insufficient documentation

## 2014-06-29 DIAGNOSIS — M792 Neuralgia and neuritis, unspecified: Secondary | ICD-10-CM | POA: Insufficient documentation

## 2014-06-29 DIAGNOSIS — I10 Essential (primary) hypertension: Secondary | ICD-10-CM | POA: Insufficient documentation

## 2014-06-29 DIAGNOSIS — M797 Fibromyalgia: Secondary | ICD-10-CM | POA: Insufficient documentation

## 2014-06-29 DIAGNOSIS — E785 Hyperlipidemia, unspecified: Secondary | ICD-10-CM | POA: Insufficient documentation

## 2014-06-29 LAB — VITAMIN D 25 HYDROXY (VIT D DEFICIENCY, FRACTURES): Vit D, 25-Hydroxy: 15 ng/mL — ABNORMAL LOW (ref 30–100)

## 2014-07-14 ENCOUNTER — Ambulatory Visit: Payer: PRIVATE HEALTH INSURANCE | Admitting: Family Medicine

## 2014-07-19 ENCOUNTER — Ambulatory Visit: Payer: PRIVATE HEALTH INSURANCE | Admitting: Family Medicine

## 2014-07-22 ENCOUNTER — Ambulatory Visit: Payer: PRIVATE HEALTH INSURANCE | Admitting: Internal Medicine

## 2014-07-27 ENCOUNTER — Other Ambulatory Visit: Payer: Self-pay | Admitting: Internal Medicine

## 2014-08-16 ENCOUNTER — Ambulatory Visit: Payer: PRIVATE HEALTH INSURANCE | Admitting: Family Medicine

## 2014-08-27 ENCOUNTER — Ambulatory Visit (INDEPENDENT_AMBULATORY_CARE_PROVIDER_SITE_OTHER): Payer: Medicaid Other | Admitting: Family Medicine

## 2014-08-27 ENCOUNTER — Ambulatory Visit: Payer: PRIVATE HEALTH INSURANCE | Admitting: Family Medicine

## 2014-08-27 ENCOUNTER — Other Ambulatory Visit: Payer: Self-pay

## 2014-08-27 VITALS — BP 122/73 | HR 71 | Temp 98.2°F | Resp 18 | Ht 63.5 in | Wt 254.0 lb

## 2014-08-27 DIAGNOSIS — I10 Essential (primary) hypertension: Secondary | ICD-10-CM

## 2014-08-27 DIAGNOSIS — Z23 Encounter for immunization: Secondary | ICD-10-CM

## 2014-08-27 DIAGNOSIS — Z0000 Encounter for general adult medical examination without abnormal findings: Secondary | ICD-10-CM | POA: Diagnosis not present

## 2014-08-27 DIAGNOSIS — R000 Tachycardia, unspecified: Secondary | ICD-10-CM | POA: Diagnosis not present

## 2014-08-27 DIAGNOSIS — R609 Edema, unspecified: Secondary | ICD-10-CM | POA: Diagnosis not present

## 2014-08-27 MED ORDER — PANTOPRAZOLE SODIUM 40 MG PO TBEC: 40.0000 mg | DELAYED_RELEASE_TABLET | Freq: Two times a day (BID) | ORAL | Status: DC

## 2014-08-27 MED ORDER — CELECOXIB 100 MG PO CAPS: 100.0000 mg | ORAL_CAPSULE | Freq: Two times a day (BID) | ORAL | Status: DC

## 2014-08-27 MED ORDER — METOPROLOL SUCCINATE ER 50 MG PO TB24: 100.0000 mg | ORAL_TABLET | Freq: Every day | ORAL | Status: DC

## 2014-08-27 NOTE — Patient Instructions (Signed)
Focus on eating more fresh fruits and vegetables Bake, broil, or boil meats. Make appointment for mammogram We are settting up referral for colonscopy

## 2014-08-27 NOTE — Progress Notes (Addendum)
Patient ID: Kathleen Herrera, female   DOB: 07/20/1960, 54 y.o.   MRN: [id number]   Fayne Mcguffee, is a 54 y.o. female  [id number]  [id number]  DOB - 13-Jun-1960  CC: No chief complaint on file.      HPI: Kathleen Herrera is a 54 y.o. female here to establish care. She has a history of hypertension and is on several medications which are listed in the med list. She has a history of asthma for which she uses prn albuterol. She has musculoskeletal pain and myalgias and takes Celebrex and gabapentin. She has a history of fatty liver and abnormal LFTS. She has GERD and is on PPI. She has a diagnosis of Metabolic syndrome. She is behind on some health maintenance issues which we will review today.   No Known Allergies Past Medical History  Diagnosis Date  . Cancer   . Hyperlipemia   . Arthritis   . Sinus congestion   . Depression   . Post traumatic stress disorder   . Bronchitis   . Fibromyalgia Y-2  . Fibromyalgia unknown   Current Outpatient Prescriptions on File Prior to Visit  Medication Sig Dispense Refill  . ADVAIR DISKUS 250-50 MCG/DOSE AEPB INHALE 1 PUFF BY MOUTH TWO TIMES A DAY 60 each 1  . amitriptyline (ELAVIL) 100 MG tablet Take 100 mg by mouth at bedtime.    Marland Kitchen amLODipine (NORVASC) 5 MG tablet Take 1 tablet (5 mg total) by mouth daily. 90 tablet 3  . ANTIFUNGAL 2 % cream APPLY TWICE A DAY 15 g 1  . cephALEXin (KEFLEX) 500 MG capsule Take 1 capsule (500 mg total) by mouth 4 (four) times daily. 40 capsule 0  . docusate sodium (COLACE) 100 MG capsule Take 1 capsule (100 mg total) by mouth every 12 (twelve) hours. (Patient not taking: Reported on 06/28/2014) 60 capsule 0  . gabapentin (NEURONTIN) 300 MG capsule Take 1 capsule (300 mg total) by mouth 4 (four) times daily. 120 capsule 3  . glycerin adult (GLYCERIN ADULT) 2 G SUPP Place 1 suppository rectally once. (Patient not taking: Reported on 06/28/2014) 20 suppository 0  . hydrOXYzine (VISTARIL) 50 MG capsule Take 50  mg by mouth 3 (three) times daily.    . magnesium citrate SOLN Take 296 mLs (1 Bottle total) by mouth once. (Patient not taking: Reported on 06/28/2014) 195 mL 0  . metoprolol succinate (TOPROL-XL) 50 MG 24 hr tablet Take 2 tablets (100 mg total) by mouth daily. Take with or immediately following a meal. 60 tablet 1  . Misc. Devices (ADJUSTABLE ALUMINUM CANE) MISC 1 each by Does not apply route once. 1 each 0  . omeprazole (PRILOSEC) 40 MG capsule Take 1 capsule (40 mg total) by mouth daily. 90 capsule 3  . oxyCODONE-acetaminophen (PERCOCET/ROXICET) 5-325 MG per tablet Take 1 tablet by mouth every 6 (six) hours as needed for severe pain. (Patient not taking: Reported on 06/28/2014) 20 tablet 0  . pantoprazole (PROTONIX) 40 MG tablet Take 40 mg by mouth 2 (two) times daily.    Marland Kitchen PARoxetine (PAXIL) 20 MG tablet Take 20 mg by mouth 2 (two) times daily.    . polyethylene glycol powder (GLYCOLAX/MIRALAX) powder Take 17 g by mouth 2 (two) times daily. Until daily soft stools  OTC (Patient not taking: Reported on 06/28/2014) 250 g 0  . PROVENTIL HFA 108 (90 BASE) MCG/ACT inhaler INHALE TWO   PUFFS INTO THE LUNGS EVERY 6 HOURS AS NEEDED FOR WHEEZING. 1 Inhaler 1  .  QUEtiapine (SEROQUEL XR) 300 MG 24 hr tablet Take 300 mg by mouth daily.    . RESTASIS 0.05 % ophthalmic emulsion INSTILL 1 DROP INTO BOTH EYES TWICE A DAY AS DIRECTED 1 each 1   No current facility-administered medications on file prior to visit.   Family History  Problem Relation Age of Onset  . Family history unknown: Yes   History   Social History  . Marital Status: Single    Spouse Name: N/A  . Number of Children: N/A  . Years of Education: N/A   Occupational History  . Not on file.   Social History Main Topics  . Smoking status: Current Every Day Smoker -- 0.25 packs/day for 37 years    Types: Cigarettes  . Smokeless tobacco: Not on file  . Alcohol Use: Yes     Comment: occasionally  . Drug Use: No  . Sexual Activity: Not  Currently   Other Topics Concern  . Not on file   Social History Narrative    Review of Systems: Constitutional: Negative for fever, chills, appetite change, weight loss,  Fatigue.Admits to cold intolerance Skin: Negative for rashes or lesions of concern.Positive for occssional flare of ecezma HENT: Negative for ear pain, ear discharge.nose bleeds. Positive for seasonal nasal congestion Eyes: Negative for pain, discharge, redness, itching and visual disturbance. Positive for dry eye Neck: Negative for pain, stiffness Respiratory: Negative for cough, shortness of breath.  Occassional coughing and wheezing with asthma flare. Cardiovascular: Negative for chest pain, palpitations and leg swelling. Gastrointestinal: Negative for abdominal pain, nausea, vomiting, diarrhea, constipations Genitourinary: Negative for dysuria, urgency, frequency, hematuria, Positive for mild incontenience with sneezing, etc. Musculoskeletal: Positive for back pain, joint pain, joint  swelling, and gait problem.Negative for weakness.Positive for myalgias Neurological: Negative for  tremors, seizures, syncope,   light-headedness, numbness and headaches. Positive for occassional dizziness with position change. Hematological: Negative for easy bruising or bleeding Psychiatric/Behavioral: Negative for depression, anxiety, decreased concentration, confusion   Objective:  There were no vitals filed for this visit.  Physical Exam: Constitutional: Patient appears well-developed and well-nourished. No distress. HENT: Normocephalic, atraumatic, External right and left ear normal. Oropharynx is clear and moist.  Eyes: Conjunctivae and EOM are normal. PERRLA, no scleral icterus. Neck: Normal ROM. Neck supple. No lymphadenopathy, No thyromegaly. CVS: RRR, S1/S2 +, no murmurs, no gallops, no rubs Pulmonary: Effort and breath sounds normal, no stridor, rhonchi, wheezes, rales.  Abdominal: Soft. Normoactive BS,, no  distension, tenderness, rebound or guarding.  Musculoskeletal: Normal range of motion. No edema and no tenderness.  Neuro: Alert.Normal muscle tone coordination. Non-focal Skin: Skin is warm and dry. No rash noted. Not diaphoretic. No erythema. No pallor. Psychiatric: Normal mood and affect. Behavior, judgment, thought content normal.  Lab Results  Component Value Date   WBC 6.0 06/28/2014   HGB 14.7 06/28/2014   HCT 44.9 06/28/2014   MCV 79.0 06/28/2014   PLT 230 06/28/2014   Lab Results  Component Value Date   CREATININE 0.89 06/28/2014   BUN 12 06/28/2014   NA 136 06/28/2014   K 3.9 06/28/2014   CL 101 06/28/2014   CO2 23 06/28/2014    Lab Results  Component Value Date   HGBA1C 6.9* 06/28/2014   Lipid Panel     Component Value Date/Time   CHOL 370* 06/28/2014 1607   TRIG 209* 06/28/2014 1607   HDL 90 06/28/2014 1607   CHOLHDL 4.1 06/28/2014 1607   VLDL 42* 06/28/2014 1607   LDLCALC 238* 06/28/2014  1607       Assessment and plan:    Health Maintenance - Review healthy lifestyle including diet, exercise, safety measures, immunizations -Tdap given today -colonoscopy ordered. -she will arrange mammogram  Hyperlipidemia -simvastatin 40 mg #90, one po q day. -low fat, low cholesterol diet recommended.  GERD -Refill of Protonix 40, #90, one po,q day  Hypertension -continue Amlodipine 10 mg daily -low salt diet  Chronic joint pain -Celobrex 100 mg #90, one po qday.  Follow-up in 6 months for recheck of chronic conditions.     The patient was given clear instructions to go to ER or return to medical center if symptoms don't improve, worsen or new problems develop. The patient verbalized understanding. The patient was told to call to get lab results if they haven't heard anything in the next week.         08/27/2014, 1:34 PM

## 2014-09-16 ENCOUNTER — Other Ambulatory Visit: Payer: Self-pay | Admitting: Family Medicine

## 2014-09-21 ENCOUNTER — Other Ambulatory Visit: Payer: Self-pay | Admitting: Family Medicine

## 2014-09-21 ENCOUNTER — Other Ambulatory Visit: Payer: Self-pay | Admitting: Internal Medicine

## 2014-09-22 ENCOUNTER — Ambulatory Visit (HOSPITAL_BASED_OUTPATIENT_CLINIC_OR_DEPARTMENT_OTHER): Payer: PRIVATE HEALTH INSURANCE | Attending: Internal Medicine

## 2014-10-06 ENCOUNTER — Other Ambulatory Visit: Payer: Self-pay | Admitting: Family Medicine

## 2014-10-08 ENCOUNTER — Other Ambulatory Visit: Payer: Self-pay | Admitting: Family Medicine

## 2014-11-24 ENCOUNTER — Other Ambulatory Visit: Payer: Self-pay | Admitting: Family Medicine

## 2014-11-24 MED ORDER — CELECOXIB 100 MG PO CAPS: 100.0000 mg | ORAL_CAPSULE | Freq: Two times a day (BID) | ORAL | Status: DC

## 2014-11-24 NOTE — Telephone Encounter (Signed)
Refill for celebrx sent into pharmacy. Thanks!

## 2014-12-15 ENCOUNTER — Other Ambulatory Visit: Payer: Self-pay | Admitting: Internal Medicine

## 2014-12-23 ENCOUNTER — Ambulatory Visit (INDEPENDENT_AMBULATORY_CARE_PROVIDER_SITE_OTHER): Payer: Medicaid Other | Admitting: Family Medicine

## 2014-12-23 ENCOUNTER — Encounter: Payer: Self-pay | Admitting: Family Medicine

## 2014-12-23 ENCOUNTER — Other Ambulatory Visit: Payer: Self-pay | Admitting: Family Medicine

## 2014-12-23 VITALS — BP 136/72 | HR 72 | Temp 98.2°F | Ht 63.5 in | Wt 259.0 lb

## 2014-12-23 DIAGNOSIS — Z0000 Encounter for general adult medical examination without abnormal findings: Secondary | ICD-10-CM

## 2014-12-23 DIAGNOSIS — R000 Tachycardia, unspecified: Secondary | ICD-10-CM | POA: Diagnosis not present

## 2014-12-23 DIAGNOSIS — M792 Neuralgia and neuritis, unspecified: Secondary | ICD-10-CM

## 2014-12-23 DIAGNOSIS — E559 Vitamin D deficiency, unspecified: Secondary | ICD-10-CM

## 2014-12-23 DIAGNOSIS — I10 Essential (primary) hypertension: Secondary | ICD-10-CM | POA: Diagnosis not present

## 2014-12-23 LAB — COMPLETE METABOLIC PANEL WITH GFR
ALT: 51 U/L — AB (ref 6–29)
AST: 61 U/L — AB (ref 10–35)
Albumin: 3.8 g/dL (ref 3.6–5.1)
Alkaline Phosphatase: 79 U/L (ref 33–130)
BUN: 9 mg/dL (ref 7–25)
CALCIUM: 9.4 mg/dL (ref 8.6–10.4)
CHLORIDE: 102 mmol/L (ref 98–110)
CO2: 25 mmol/L (ref 20–31)
CREATININE: 0.63 mg/dL (ref 0.50–1.05)
GFR, Est African American: >89 mL/min (ref >=60)
GFR, Est Non African American: >89 mL/min (ref >=60)
Glucose, Bld: 147 mg/dL — ABNORMAL HIGH (ref 65–99)
Potassium: 3.9 mmol/L (ref 3.5–5.3)
Sodium: 139 mmol/L (ref 135–146)
Total Bilirubin: 0.4 mg/dL (ref 0.2–1.2)
Total Protein: 7 g/dL (ref 6.1–8.1)

## 2014-12-23 LAB — CBC WITH DIFFERENTIAL/PLATELET
Basophils Absolute: 0.1 10*3/uL (ref 0.0–0.1)
Basophils Relative: 1 % (ref 0–1)
EOS PCT: 1 % (ref 0–5)
Eosinophils Absolute: 0.1 10*3/uL (ref 0.0–0.7)
HEMATOCRIT: 39.5 % (ref 36.0–46.0)
Hemoglobin: 13.1 g/dL (ref 12.0–15.0)
LYMPHS ABS: 2.5 10*3/uL (ref 0.7–4.0)
LYMPHS PCT: 36 % (ref 12–46)
MCH: 26.6 pg (ref 26.0–34.0)
MCHC: 33.2 g/dL (ref 30.0–36.0)
MCV: 80.1 fL (ref 78.0–100.0)
MONO ABS: 0.4 10*3/uL (ref 0.1–1.0)
MONOS PCT: 6 % (ref 3–12)
MPV: 9.6 fL (ref 8.6–12.4)
Neutro Abs: 3.9 10*3/uL (ref 1.7–7.7)
Neutrophils Relative %: 56 % (ref 43–77)
Platelets: 225 10*3/uL (ref 150–400)
RBC: 4.93 MIL/uL (ref 3.87–5.11)
RDW: 16 % — AB (ref 11.5–15.5)
WBC: 7 10*3/uL (ref 4.0–10.5)

## 2014-12-23 LAB — LIPID PANEL
CHOLESTEROL: 312 mg/dL — AB (ref 125–200)
HDL: 70 mg/dL (ref >=46)
LDL Cholesterol: 184 mg/dL — ABNORMAL HIGH (ref <130)
Total CHOL/HDL Ratio: 4.5 Ratio (ref <=5.0)
Triglycerides: 288 mg/dL — ABNORMAL HIGH (ref <150)
VLDL: 58 mg/dL — AB (ref <30)

## 2014-12-23 MED ORDER — AMLODIPINE BESYLATE 5 MG PO TABS: ORAL_TABLET | ORAL | Status: DC

## 2014-12-23 MED ORDER — METOPROLOL SUCCINATE ER 50 MG PO TB24: 100.0000 mg | ORAL_TABLET | Freq: Every day | ORAL | Status: DC

## 2014-12-23 MED ORDER — GABAPENTIN 300 MG PO CAPS: 300.0000 mg | ORAL_CAPSULE | Freq: Four times a day (QID) | ORAL | Status: DC

## 2014-12-23 MED ORDER — ALBUTEROL SULFATE HFA 108 (90 BASE) MCG/ACT IN AERS: INHALATION_SPRAY | RESPIRATORY_TRACT | Status: DC

## 2014-12-23 MED ORDER — MICONAZOLE NITRATE 2 % EX CREA: TOPICAL_CREAM | Freq: Two times a day (BID) | CUTANEOUS | Status: DC

## 2014-12-23 MED ORDER — AMITRIPTYLINE HCL 100 MG PO TABS: 100.0000 mg | ORAL_TABLET | Freq: Every day | ORAL | Status: DC

## 2014-12-23 MED ORDER — CELECOXIB 100 MG PO CAPS: 100.0000 mg | ORAL_CAPSULE | Freq: Two times a day (BID) | ORAL | Status: DC

## 2014-12-23 MED ORDER — PANTOPRAZOLE SODIUM 40 MG PO TBEC: 40.0000 mg | DELAYED_RELEASE_TABLET | Freq: Two times a day (BID) | ORAL | Status: DC
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
## 2014-12-26 LAB — VITAMIN D 1,25 DIHYDROXY
Vitamin D 1, 25 (OH)2 Total: 68 pg/mL (ref 18–72)
Vitamin D2 1, 25 (OH)2: <8 pg/mL
Vitamin D3 1, 25 (OH)2: 68 pg/mL

## 2014-12-27 ENCOUNTER — Telehealth: Payer: Self-pay

## 2014-12-27 ENCOUNTER — Other Ambulatory Visit: Payer: Self-pay | Admitting: Family Medicine

## 2014-12-27 LAB — HEMOGLOBIN A1C
Hgb A1c MFr Bld: 6.7 % — ABNORMAL HIGH (ref <5.7)
MEAN PLASMA GLUCOSE: 146 mg/dL — AB (ref <117)

## 2014-12-27 MED ORDER — PRAVASTATIN SODIUM 40 MG PO TABS: 40.0000 mg | ORAL_TABLET | Freq: Every day | ORAL | Status: DC

## 2014-12-27 NOTE — Telephone Encounter (Signed)
Called and spoke with patient and advised of lab results and to start cholesterol medication as prescribed. Called [facility] and  added hgba1c. Thanks!

## 2014-12-27 NOTE — Telephone Encounter (Signed)
-----   Message from Henrietta HooverLinda C Bernhardt, NP sent at 12/27/2014  8:12 AM EST ----- Vitamin D normal. Lipids showes elevated cholesterol and triglycerides. Will send in prescription. Glucose 147. (See if we can add A1C). CBC shows nothing of concern.

## 2015-01-09 ENCOUNTER — Emergency Department (HOSPITAL_COMMUNITY)
Admission: EM | Admit: 2015-01-09 | Discharge: 2015-01-09 | Disposition: A | Discharge status: Home / Self Care | Payer: Medicaid Other | Attending: Emergency Medicine | Admitting: Emergency Medicine

## 2015-01-09 ENCOUNTER — Encounter (HOSPITAL_COMMUNITY): Payer: Self-pay | Admitting: Emergency Medicine

## 2015-01-09 DIAGNOSIS — Z7951 Long term (current) use of inhaled steroids: Secondary | ICD-10-CM | POA: Insufficient documentation

## 2015-01-09 DIAGNOSIS — F329 Major depressive disorder, single episode, unspecified: Secondary | ICD-10-CM | POA: Diagnosis not present

## 2015-01-09 DIAGNOSIS — M797 Fibromyalgia: Secondary | ICD-10-CM | POA: Diagnosis not present

## 2015-01-09 DIAGNOSIS — M1990 Unspecified osteoarthritis, unspecified site: Secondary | ICD-10-CM | POA: Insufficient documentation

## 2015-01-09 DIAGNOSIS — Z8709 Personal history of other diseases of the respiratory system: Secondary | ICD-10-CM | POA: Diagnosis not present

## 2015-01-09 DIAGNOSIS — F1410 Cocaine abuse, uncomplicated: Secondary | ICD-10-CM | POA: Diagnosis not present

## 2015-01-09 DIAGNOSIS — F17210 Nicotine dependence, cigarettes, uncomplicated: Secondary | ICD-10-CM | POA: Insufficient documentation

## 2015-01-09 DIAGNOSIS — Z79899 Other long term (current) drug therapy: Secondary | ICD-10-CM | POA: Diagnosis not present

## 2015-01-09 DIAGNOSIS — F419 Anxiety disorder, unspecified: Secondary | ICD-10-CM | POA: Diagnosis not present

## 2015-01-09 DIAGNOSIS — E785 Hyperlipidemia, unspecified: Secondary | ICD-10-CM | POA: Insufficient documentation

## 2015-01-09 DIAGNOSIS — Z008 Encounter for other general examination: Secondary | ICD-10-CM | POA: Diagnosis present

## 2015-01-09 DIAGNOSIS — F4310 Post-traumatic stress disorder, unspecified: Secondary | ICD-10-CM | POA: Diagnosis not present

## 2015-01-09 NOTE — ED Notes (Signed)
Patient was alert, oriented and stable upon discharge. RN went over AVS and patient had no further questions.  

## 2015-01-09 NOTE — Discharge Instructions (Signed)
It was our pleasure to provide your ER care today - we hope that you feel better.  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  Follow up with your doctor/therapist in the coming week.  Return to ER if worse, new symptoms, medical emergency, other concern.     Polysubstance Abuse When people abuse more than one drug or type of drug it is called polysubstance or polydrug abuse. For example, many smokers also drink alcohol. This is one form of polydrug abuse. Polydrug abuse also refers to the use of a drug to counteract an unpleasant effect produced by another drug. It may also be used to help with withdrawal from another drug. People who take stimulants may become agitated. Sometimes this agitation is countered with a tranquilizer. This helps protect against the unpleasant side effects. Polydrug abuse also refers to the use of different drugs at the same time.  Anytime drug use is interfering with normal living activities, it has become abuse. This includes problems with family and friends. Psychological dependence has developed when your mind tells you that the drug is needed. This is usually followed by physical dependence which has developed when continuing increases of drug are required to get the same feeling or "high". This is known as addiction or chemical dependency. A person's risk is much higher if there is a history of chemical dependency in the family. SIGNS OF CHEMICAL DEPENDENCY  You have been told by friends or family that drugs have become a problem.  You fight when using drugs.  You are having blackouts (not remembering what you do while using).  You feel sick from using drugs but continue using.  You lie about use or amounts of drugs (chemicals) used.  You need chemicals to get you going.  You are suffering in work performance or in school because of drug use.  You get sick from  use of drugs but continue to use anyway.  You need drugs to relate to people or feel comfortable in social situations.  You use drugs to forget problems. "Yes" answered to any of the above signs of chemical dependency indicates there are problems. The longer the use of drugs continues, the greater the problems will become. If there is a family history of drug or alcohol use, it is best not to experiment with these drugs. Continual use leads to tolerance. After tolerance develops more of the drug is needed to get the same feeling. This is followed by addiction. With addiction, drugs become the most important part of life. It becomes more important to take drugs than participate in the other usual activities of life. This includes relating to friends and family. Addiction is followed by dependency. Dependency is a condition where drugs are now needed not just to get high, but to feel normal. Addiction cannot be cured but it can be stopped. This often requires outside help and the care of professionals. Treatment centers are listed in the yellow pages under: Cocaine, Narcotics, and Alcoholics Anonymous. Most hospitals and clinics can refer you to a specialized care center. Talk to your caregiver if you need help.   This information is not intended to replace advice given to you by your health care provider. Make sure you discuss any questions you have with your health care provider.   Document Released: 09/13/2004 Document Revised: 04/16/2011 Document Reviewed: 01/27/2014 Elsevier Interactive Patient Education [employer]  Stimulant Use Disorder-Cocaine Cocaine is one of a group of powerful drugs called stimulants. Cocaine has medical uses for stopping nosebleeds and for pain control before minor nose or dental surgery. However, cocaine is misused because of the effects that it produces. These effects include:   A feeling of extreme pleasure.  Alertness.  High energy. Common street  names for cocaine include coke, crack, blow, snow, and nose candy. Cocaine is snorted, dissolved in water and injected, or smoked.  Stimulants are addictive because they activate regions of the brain that produce both the pleasurable sensation of "reward" and psychological dependence. Together, these actions account for loss of control and the rapid development of drug dependence. This means you become ill without the drug (withdrawal) and need to keep using it to function.  Stimulant use disorder is use of stimulants that disrupts your daily life. It disrupts relationships with family and friends and how you do your job. Cocaine increases your blood pressure and heart rate. It can cause a heart attack or stroke. Cocaine can also cause death from irregular heart rate or seizures. SYMPTOMS Symptoms of stimulant use disorder with cocaine include:  Use of cocaine in larger amounts or over a longer period of time than intended.  Unsuccessful attempts to cut down or control cocaine use.  A lot of time spent obtaining, using, or recovering from the effects of cocaine.  A strong desire or urge to use cocaine (craving).  Continued use of cocaine in spite of major problems at work, school, or home because of use.  Continued use of cocaine in spite of relationship problems because of use.  Giving up or cutting down on important life activities because of cocaine use.  Use of cocaine over and over in situations when it is physically hazardous, such as driving a car.  Continued use of cocaine in spite of a physical problem that is likely related to use. Physical problems can include:  Malnutrition.  Nosebleeds.  Chest pain.  High blood pressure.  A hole that develops between the part of your nose that separates your nostrils (perforated nasal septum).  Lung and kidney damage.  Continued use of cocaine in spite of a mental problem that is likely related to use. Mental problems can  include:  Schizophrenia-like symptoms.  Depression.  Bipolar mood swings.  Anxiety.  Sleep problems.  Need to use more and more cocaine to get the same effect, or lessened effect over time with use of the same amount of cocaine (tolerance).  Having withdrawal symptoms when cocaine use is stopped, or using cocaine to reduce or avoid withdrawal symptoms. Withdrawal symptoms include:  Depressed or irritable mood.  Low energy or restlessness.  Bad dreams.  Poor or excessive sleep.  Increased appetite. DIAGNOSIS Stimulant use disorder is diagnosed by your health care provider. You may be asked questions about your cocaine use and how it affects your life. A physical exam may be done. A drug screen may be ordered. You may be referred to a mental health professional. The diagnosis of stimulant use disorder requires at least two symptoms within 12 months. The type of stimulant use disorder depends on the number of signs and symptoms you have. The type may be:  Mild. Two or three signs and symptoms.  Moderate. Four or five signs and symptoms.  Severe. Six or more signs and symptoms. TREATMENT Treatment for stimulant use disorder is usually provided by mental health professionals with training in substance use disorders. The following options are  available:  Counseling or talk therapy. Talk therapy addresses the reasons you use cocaine and ways to keep you from using again. Goals of talk therapy include:  Identifying and avoiding triggers for use.  Handling cravings.  Replacing use with healthy activities.  Support groups. Support groups provide emotional support, advice, and guidance.  Medicine. Certain medicines may decrease cocaine cravings or withdrawal symptoms. HOME CARE INSTRUCTIONS  Take medicines only as directed by your health care provider.  Identify the people and activities that trigger your cocaine use and avoid them.  Keep all follow-up visits as directed by  your health care provider. SEEK MEDICAL CARE IF:  Your symptoms get worse or you relapse.  You are not able to take medicines as directed. SEEK IMMEDIATE MEDICAL CARE IF:  You have serious thoughts about hurting yourself or others.  You have a seizure, chest pain, sudden weakness, or loss of speech or vision. FOR MORE INFORMATION  National Institute on Drug Abuse: http://www.price-smith.com/  Substance Abuse and Mental Health Services Administration: SkateOasis.com.pt   This information is not intended to replace advice given to you by your health care provider. Make sure you discuss any questions you have with your health care provider.   Document Released: 01/20/2000 Document Revised: 02/12/2014 Document Reviewed: 02/04/2013 Elsevier Interactive Patient Education 2016 [employer].     Emergency Department Resource Guide 1) Find a Doctor and Pay Out of Pocket Although you won't have to find out who is covered by your insurance plan, it is a good idea to ask around and get recommendations. You will then need to call the office and see if the doctor you have chosen will accept you as a new patient and what types of options they offer for patients who are self-pay. Some doctors offer discounts or will set up payment plans for their patients who do not have insurance, but you will need to ask so you aren't surprised when you get to your appointment.  2) Contact Your Local Health Department Not all health departments have doctors that can see patients for sick visits, but many do, so it is worth a call to see if yours does. If you don't know where your local health department is, you can check in your phone book. The CDC also has a tool to help you locate your state's health department, and many state websites also have listings of all of their local health departments.  3) Find a Walk-in Clinic If your illness is not likely to be very severe or complicated, you may want to try a walk in clinic.  These are popping up all over the country in pharmacies, drugstores, and shopping centers. They're usually staffed by nurse practitioners or physician assistants that have been trained to treat common illnesses and complaints. They're usually fairly quick and inexpensive. However, if you have serious medical issues or chronic medical problems, these are probably not your best option.  No Primary Care Doctor: - Call Health Connect at  [phone] - they can help you locate a primary care doctor that  accepts your insurance, provides certain services, etc. - Physician Referral Service- [phone]  Chronic Pain Problems: Organization         Address  Phone   Notes  [facility]  [phone] Patients need to be referred by their primary care doctor.   Medication Assistance: Organization         Address  Phone   Notes  [facility] Medication Assistance Program  [address] [place], [place] [address] [phone] --Must be a resident of [facility] -- Must have NO insurance coverage whatsoever (no Medicaid/ Medicare, etc.) -- The pt. MUST have a primary care doctor that directs their care regularly and follows them in the community   [facility]  [phone]   [employer]  [phone]    Agencies that provide inexpensive medical care: Organization         Address  Phone   Notes  [facility]  [phone]   [facility]    [phone]   [facility] [address] [place], [place] [address] [phone]   [facility] [address] [place] [phone]   Planned Parenthood    [phone]   [facility]    [phone]   [facility]  [address], [place] Phone:  [phone], Fax:  [phone] Hours of Operation:  9 am - 6 pm, M-F.  Also accepts Medicaid/Medicare and self-pay.  [facility]  [address], [place] Phone: [phone], Fax: [phone]. Hours of Operation:  8:30 am - 5:30 pm, M-F.  Also accepts Medicaid and self-pay.  [facility] [place] [address] [place] Phone: [phone]   [facility] [address] Natasha Bence [place], [place] [phone], Ext. [phone] Mondays & Thursdays: 7-9 AM.  First 15 patients are seen on a first come, first serve basis.    Medicaid-accepting [facility] Providers:  Organization         Address  Phone   Notes  [facility] [address], [place] [phone] Also accepts self-pay patients.  [facility] [address] Laurell Josephs [place], [place]  [phone]   [facility] [address], [place] [phone]   [facility] [address], [place] [phone]   Renaye Rakers [address], [place]   [phone] Only accepts [place] Access [place] patients after they have their name applied to their card.   Self-Pay (no insurance) in [facility]:  Organization         Address  Phone   Notes  Sickle Cell Patients, [facility] [address] [place], [place] [phone]   [facility] [address] [place], [place] [phone]   [facility]  [address], [place] [phone]   [facility]/Dr. Osei-Bonsu  [address] [place] or [address], [place] [phone] Phone number for both [place] and [place] locations is the same.  [facility] [address], [place] [phone]   [facility] [address], [place] or [address] [phone]   [facility] [address], [place] [phone], phone; [phone], fax Sees patients 1st  and 3rd Saturday of every month.  Must not qualify for public or private insurance (i.e. Medicaid, Medicare,  Health Choice, Veterans' Benefits)  Household income should be no more than 200% of the poverty level The clinic cannot treat you if you are pregnant or think you are pregnant  Sexually transmitted diseases are not treated at the clinic.  Dental Care: Organization         Address  Phone  Notes  [facility] [address], [place] [phone]) [phone] Accepts children up to age 55 who are enrolled in [place] or [place] Health Choice; pregnant women with a Medicaid card; and children who have applied for Medicaid or [place] Health Choice, but were declined, whose parents can pay a reduced fee at time of service.  [facility] [place]  [address], [place] [phone]) [phone] Accepts children up to age 55 who are enrolled in [place] or [place] Health Choice; pregnant women with a Medicaid card; and children who have applied for Medicaid or [place] Health Choice, but were declined, whose parents can pay a reduced fee at time of service.  [facility]  [address], [place] [phone]) [phone] Patients are seen by appointment only. Walk-ins are not accepted. [facility] will see patients 55 years of age and older. Monday - Tuesday (8am-5pm) Most Wednesdays (8:30-5pm) $30 per visit, cash only  [facility]  [address], [facility] [phone] Patients are seen by appointment only. Walk-ins are not accepted. [facility] will see patients 55 years of age and older. One Wednesday Evening (Monthly: Volunteer Based).  $30 per visit, cash only  [facility] [facility]  [phone]) [phone] for adults; Children under age 914, call Graduate Pediatric Dentistry at [phone]. Children aged 574-14, please call [phone]) [phone] to request a pediatric  application.  Dental services are provided in all areas of dental care including fillings, crowns and bridges, complete and partial dentures, implants, gum treatment, root canals, and extractions. Preventive care is also provided. Treatment is provided to both adults and children. Patients are selected via a lottery and there is often a waiting list.   [facility] [address], [place]  [phone]) [phone] [email or web address]   [facility] [address], [place], [place] [phone], Ext. [phone] Second and Fourth Thursday of each month, opens at 6:30 AM; Clinic ends at 9 AM.  Patients are seen on a first-come first-served basis, and a limited number are seen during each clinic.   [facility]  [address], [place], [place] [phone]   Eligibility Requirements You must have lived in [place], [place], or [place] counties for at least the last three months.   You cannot be eligible for state or federal sponsored National Cityhealthcare insurance, including CIGNAVeterans Administration, [place]icaid, or [employer]icare.   You generally cannot be eligible for healthcare insurance through your employer.    How to apply: Eligibility screenings are held every Tuesday and Wednesday afternoon from 1:00 pm until 4:00 pm. You do not need an appointment for the interview!  [facility] [address], [place], [place] [phone]   [facility]  [phone]   [facility]  [phone]   [facility]  [phone]    Behavioral Health Resources in the Community: Intensive Outpatient Programs Organization         Address  Phone  Notes  [facility] [address], [place], [place] [phone]   [facility] [address], [place], [place] [phone]   [facility] [address], [place], [place]  [phone]   [facility]  [address],  [place], [place] [phone] or [phone]   Substance Abuse Resources Organization         Address  Phone  Notes  [facility]  [phone]   [facility]  [phone]   [facility] [place]  [phone]   Floydene Flock  [phone]   Residential & Outpatient Substance Abuse Program  [phone]   Psychological Services Organization         Address  Phone  Notes  [facility]  [phone]   [facility]  [phone]   [facility] [address] [place] [phone] or [phone]    Mobile Crisis Teams Organization         Address  Phone  Notes  [facility], Mobile Crisis Care Unit  [phone]   [facility]  [address] [place], [place] [phone]   Doristine Locks [address] [place] [phone]    Self-Help/Support Groups Organization         Address  Phone             Notes  [facility] [place] - variety of support groups  [phone]- [phone] Call for more information  Narcotics Anonymous (NA), [facility] [address], [employer] [place]  2 meetings at this location   Statistician         Address  Phone  Notes  [facility] [address],    [place]  [phone]   [facility]  [address], [place] [address], [place], [place] [phone]   [facility] [address] [place], [place] [phone] Admissions: 8am-3pm M-F  [facility] [address]    [place], [place] [phone]   [facility] [address] [place], [place], [place] [phone]   [facility] [address]  [place], [place] [phone]   [facility] - Intensive Outpatient [address]urell Josephs [address], [place], [place] [phone]   [facility] ([facility] [address] [place].,   [place], [place] [phone] or [phone]   [facility] ([facility]) [address] [place], [place] [phone] Accepts Medicaid  [facility] [address],  [place] [phone] Substance Abuse/Addiction Treatment   [facility] Organization         Address  Phone  Notes  [facility]  [phone]   Angie Fava, PhD [address] [place] [address] Ervin Knack [place], [place]   [phone] or [phone]   [facility]   [address] [place] [address] [place], [place] [phone]   [facility] [address], [place], [place] [phone] Insurance/Medicaid/sponsorship through [facility] Families [address]                                    [place], [place] [phone] Therapy/tele-psych/case  [facility] [address], [place] [phone]    Dr. Lolly Mustache  [phone]   [facility]  [employer] [address] [facility] Dept. 1) [address] [place] 2) [address], [place] 3)  [address], [place] [phone]  [phone]   [facility] Child Abuse Hotline 8508627371 or [phone] (After Hours)

## 2015-01-09 NOTE — ED Notes (Signed)
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 I called Behavior Health and they told me to get medically cleared before I could come there." Denies SI/HI.

## 2015-01-09 NOTE — ED Provider Notes (Signed)
CSN: [id number]     Arrival date & time 01/09/15  1450 History   First MD Initiated Contact with Patient 01/09/15 1545     Chief Complaint  Patient presents with  . Medical Clearance  . Drug Problem     (Consider location/radiation/quality/duration/timing/severity/associated sxs/prior Treatment) Patient is a 54 y.o. female presenting with drug problem. The history is provided by the patient.  Drug Problem Pertinent negatives include no chest pain, no abdominal pain, no headaches and no shortness of breath.  Patient presents w cocaine/crack abuse.  Pt indicates has been using intermittently in the past 20 years. Denies recent change in pattern of abuse. Rare etoh use - 'I might drink a beer once in a long while'.  No recent febrile illness or injury.  Pt indicates recently in baseline, well state of health.  Patient notes hx anxiety, ptsd, and bipolar - states seems stable for long while. Pt indicates goes through [facility] for those services.  Pt indicates normal appetite. Eating and drinking well. Not working - states has been on disability for long while.  No thoughts of harm to self, no severe depression.       Past Medical History  Diagnosis Date  . Hyperlipemia   . Arthritis   . Sinus congestion   . Depression   . Post traumatic stress disorder   . Bronchitis   . Fibromyalgia Y-2  . Fibromyalgia unknown   Past Surgical History  Procedure Laterality Date  . Abdominal hysterectomy     Family History  Problem Relation Age of Onset  . Family history unknown: Yes   Social History  Substance Use Topics  . Smoking status: Current Every Day Smoker -- 0.25 packs/day for 37 years    Types: Cigarettes  . Smokeless tobacco: None  . Alcohol Use: Yes     Comment: occasionally   OB History    No data available     Review of Systems  Constitutional: Negative for fever and chills.  HENT: Negative for sore throat.   Eyes: Negative for redness.  Respiratory: Negative for  shortness of breath.   Cardiovascular: Negative for chest pain.  Gastrointestinal: Negative for vomiting, abdominal pain and diarrhea.  Genitourinary: Negative for flank pain.  Musculoskeletal: Negative for back pain and neck pain.  Skin: Negative for rash.  Neurological: Negative for headaches.  Hematological: Does not bruise/bleed easily.  Psychiatric/Behavioral: Negative for suicidal ideas and confusion. The patient is nervous/anxious.       Allergies  Review of patient's allergies indicates no known allergies.  Home Medications   Prior to Admission medications   Medication Sig Start Date End Date Taking? Authorizing Provider  albuterol (PROVENTIL HFA) 108 (90 BASE) MCG/ACT inhaler INHALE TWO   PUFFS INTO THE LUNGS EVERY 6 HOURS AS NEEDED FOR WHEEZING. 12/23/14  Yes Henrietta HooverLinda C Bernhardt, NP  amLODipine (NORVASC) 5 MG tablet TAKE 1 TABLET (5 MG TOTAL) BY MOUTH DAILY. 12/23/14  Yes Henrietta HooverLinda C Bernhardt, NP  celecoxib (CELEBREX) 100 MG capsule Take 1 capsule (100 mg total) by mouth 2 (two) times daily. 12/23/14  Yes Henrietta HooverLinda C Bernhardt, NP  Fluticasone-Salmeterol (ADVAIR) 250-50 MCG/DOSE AEPB Inhale 1 puff into the lungs 2 (two) times daily.   Yes Historical Provider, MD  gabapentin (NEURONTIN) 300 MG capsule Take 1 capsule (300 mg total) by mouth 4 (four) times daily. 12/23/14  Yes Henrietta HooverLinda C Bernhardt, NP  lithium carbonate 300 MG capsule Take 300 mg by mouth 3 (three) times daily with meals.  Yes Historical Provider, MD  metoprolol succinate (TOPROL-XL) 50 MG 24 hr tablet Take 2 tablets (100 mg total) by mouth daily. Take with or immediately following a meal. 12/23/14  Yes Henrietta Hoover, NP  Misc. Devices (ADJUSTABLE ALUMINUM CANE) MISC 1 each by Does not apply route once. 06/28/14  Yes Altha Harm, MD  pantoprazole (PROTONIX) 40 MG tablet Take 1 tablet (40 mg total) by mouth 2 (two) times daily. 12/23/14  Yes Henrietta Hoover, NP  PARoxetine (PAXIL) 20 MG tablet Take 20 mg by mouth  2 (two) times daily.   Yes Historical Provider, MD  pravastatin (PRAVACHOL) 40 MG tablet Take 1 tablet (40 mg total) by mouth daily. 12/27/14  Yes Henrietta Hoover, NP  RESTASIS 0.05 % ophthalmic emulsion INSTILL 1 DROP INTO BOTH EYES TWICE A DAY AS DIRECTED 09/17/14  Yes Henrietta Hoover, NP  amitriptyline (ELAVIL) 100 MG tablet Take 1 tablet (100 mg total) by mouth at bedtime. Patient not taking: Reported on 01/09/2015 12/23/14   Henrietta Hoover, NP  glycerin adult (GLYCERIN ADULT) 2 G SUPP Place 1 suppository rectally once. Patient not taking: Reported on 01/09/2015 11/30/13   Charlestine Night, PA-C  miconazole (ANTIFUNGAL) 2 % cream Apply topically 2 (two) times daily. Patient not taking: Reported on 01/09/2015 12/23/14   Henrietta Hoover, NP   BP 139/88 mmHg  Pulse 80  Temp(Src) 98.6 F (37 C) (Oral)  Resp 19  Ht  (1.6 m)  Wt 115.214 kg  BMI 45.01 kg/m2  SpO2 95% Physical Exam  Constitutional: She appears well-developed and well-nourished. No distress.  Eyes: Conjunctivae are normal. No scleral icterus.  Neck: Neck supple. No tracheal deviation present.  Cardiovascular: Normal rate.   Pulmonary/Chest: Effort normal. No respiratory distress.  Abdominal: Normal appearance. She exhibits no distension.  Musculoskeletal: She exhibits no edema.  Neurological: She is alert.  Skin: Skin is warm and dry. No rash noted.  Psychiatric: She has a normal mood and affect.  Nursing note and vitals reviewed.   ED Course  Procedures (including critical care time)    MDM   Reviewed nursing notes and prior charts for additional history.   Discussed pt that we do not do inpatient cocaine rehab in the hospital, and that we will provide her our community resource guide incl substance abuse resources.  Pt currently appears stable for d/c from the ED.    Cathren Laine, MD 01/09/15 [id number]

## 2015-02-10 ENCOUNTER — Other Ambulatory Visit: Payer: Self-pay

## 2015-02-10 MED ORDER — FLUTICASONE-SALMETEROL 250-50 MCG/DOSE IN AEPB: 1.0000 | INHALATION_SPRAY | Freq: Two times a day (BID) | RESPIRATORY_TRACT | Status: DC

## 2015-02-10 NOTE — Telephone Encounter (Signed)
Refill for advair sent to pharmacy. Thanks!

## 2015-02-28 ENCOUNTER — Ambulatory Visit: Payer: PRIVATE HEALTH INSURANCE | Admitting: Family Medicine

## 2015-03-15 ENCOUNTER — Other Ambulatory Visit: Payer: Self-pay | Admitting: Family Medicine

## 2015-03-15 MED ORDER — PANTOPRAZOLE SODIUM 40 MG PO TBEC: 40.0000 mg | DELAYED_RELEASE_TABLET | Freq: Two times a day (BID) | ORAL | Status: DC

## 2015-03-15 NOTE — Telephone Encounter (Signed)
Refill for paroxetine sent into pharmacy. Thanks!

## 2015-03-21 ENCOUNTER — Other Ambulatory Visit: Payer: Self-pay | Admitting: Family Medicine

## 2015-03-21 DIAGNOSIS — F3160 Bipolar disorder, current episode mixed, unspecified: Secondary | ICD-10-CM

## 2015-03-28 ENCOUNTER — Other Ambulatory Visit: Payer: Self-pay | Admitting: Family Medicine

## 2015-03-28 ENCOUNTER — Encounter: Payer: Self-pay | Admitting: Family Medicine

## 2015-03-28 ENCOUNTER — Ambulatory Visit (INDEPENDENT_AMBULATORY_CARE_PROVIDER_SITE_OTHER): Payer: Medicaid Other | Admitting: Family Medicine

## 2015-03-28 VITALS — BP 135/92 | HR 74 | Temp 98.0°F | Ht 63.5 in | Wt 262.0 lb

## 2015-03-28 DIAGNOSIS — F3160 Bipolar disorder, current episode mixed, unspecified: Secondary | ICD-10-CM

## 2015-03-28 DIAGNOSIS — E7800 Pure hypercholesterolemia, unspecified: Secondary | ICD-10-CM

## 2015-03-28 DIAGNOSIS — M792 Neuralgia and neuritis, unspecified: Secondary | ICD-10-CM

## 2015-03-28 DIAGNOSIS — R000 Tachycardia, unspecified: Secondary | ICD-10-CM | POA: Diagnosis not present

## 2015-03-28 DIAGNOSIS — K219 Gastro-esophageal reflux disease without esophagitis: Secondary | ICD-10-CM

## 2015-03-28 DIAGNOSIS — Z0000 Encounter for general adult medical examination without abnormal findings: Secondary | ICD-10-CM

## 2015-03-28 DIAGNOSIS — M1990 Unspecified osteoarthritis, unspecified site: Secondary | ICD-10-CM

## 2015-03-28 DIAGNOSIS — I10 Essential (primary) hypertension: Secondary | ICD-10-CM

## 2015-03-28 DIAGNOSIS — E559 Vitamin D deficiency, unspecified: Secondary | ICD-10-CM

## 2015-03-28 LAB — COMPLETE METABOLIC PANEL WITH GFR
ALBUMIN: 4.2 g/dL (ref 3.6–5.1)
ALK PHOS: 70 U/L (ref 33–130)
ALT: 28 U/L (ref 6–29)
AST: 26 U/L (ref 10–35)
BUN: 10 mg/dL (ref 7–25)
CALCIUM: 9.6 mg/dL (ref 8.6–10.4)
CO2: 27 mmol/L (ref 20–31)
CREATININE: 0.77 mg/dL (ref 0.50–1.05)
Chloride: 101 mmol/L (ref 98–110)
GFR, Est African American: >89 mL/min (ref >=60)
GFR, Est Non African American: 87 mL/min (ref >=60)
Glucose, Bld: 109 mg/dL — ABNORMAL HIGH (ref 65–99)
POTASSIUM: 4.3 mmol/L (ref 3.5–5.3)
Sodium: 138 mmol/L (ref 135–146)
Total Bilirubin: 0.4 mg/dL (ref 0.2–1.2)
Total Protein: 7.5 g/dL (ref 6.1–8.1)

## 2015-03-28 MED ORDER — PAROXETINE HCL 20 MG PO TABS: 20.0000 mg | ORAL_TABLET | ORAL | Status: DC

## 2015-03-28 MED ORDER — AMITRIPTYLINE HCL 100 MG PO TABS: 100.0000 mg | ORAL_TABLET | Freq: Every day | ORAL | Status: DC

## 2015-03-28 MED ORDER — SIMVASTATIN 20 MG PO TABS: 20.0000 mg | ORAL_TABLET | Freq: Every day | ORAL | Status: DC

## 2015-03-28 MED ORDER — LITHIUM CARBONATE 300 MG PO CAPS: 300.0000 mg | ORAL_CAPSULE | Freq: Three times a day (TID) | ORAL | Status: DC

## 2015-03-28 MED ORDER — PANTOPRAZOLE SODIUM 40 MG PO TBEC: 40.0000 mg | DELAYED_RELEASE_TABLET | Freq: Two times a day (BID) | ORAL | Status: DC

## 2015-03-28 MED ORDER — METOPROLOL SUCCINATE ER 50 MG PO TB24: 100.0000 mg | ORAL_TABLET | Freq: Every day | ORAL | Status: DC

## 2015-03-28 MED ORDER — LAMOTRIGINE 25 MG PO TABS: ORAL_TABLET | ORAL | Status: DC

## 2015-03-28 MED ORDER — AMLODIPINE BESYLATE 10 MG PO TABS: ORAL_TABLET | ORAL | Status: DC

## 2015-03-28 MED ORDER — GABAPENTIN 300 MG PO CAPS: 300.0000 mg | ORAL_CAPSULE | Freq: Four times a day (QID) | ORAL | Status: DC

## 2015-03-28 MED ORDER — CELECOXIB 100 MG PO CAPS: 100.0000 mg | ORAL_CAPSULE | Freq: Two times a day (BID) | ORAL | Status: DC

## 2015-03-28 NOTE — Patient Instructions (Signed)
Read bottles carefully to be sure of correct dosages. You need to get in with a new mental health provider. I have only given you one month of mental health medications.  Increase amlodipine to 10 mg daily.

## 2015-03-28 NOTE — Progress Notes (Signed)
Patient ID: Kathleen Herrera, female   DOB: 12/20/1960, 55 y.o.   MRN: [id number]   Kathleen Herrera, is a 55 y.o. female  WUJ:[id number]  [id number]  DOB - [birth year]  CC:  Chief Complaint  Patient presents with  . follow up    concerned about blood pressure levels, also having difficulty taking pravastatin due to lack of coating on pill, are there any other options, concerned about decreased appetite, and needs another order for cane, left it in store        HPI: Kathleen Herrera is a 56 y.o. female here for follow-up chronic conditions. He has diagnoses of hypertension, tachycardia, arthritis, fibromyalgia,Bipolar depression. He has been dismissed from her current mental health provider and is about out of her mental health medications. She has her current bottles with her. She request a refill until she can find a new mental health professional. She request a new prescription for a cane as she has lost the one I ordered 6 months ago.  She complains that her pravastatin does not have a slick coating and she is unable to swallow and request a change to something new.   Health Maintenance:  She declines  Influenza, Tdap is up to date. She has been screened for HIV but still needs screening for Hepatitis C. She has had a hysterectomy so does not need a PAP. Reports a normal mammogram in 2016. Does need a colon cancer screening.  She admits to smoking 2-3 cigarettes a day. Denies alcohol and drug use.  No Known Allergies Past Medical History  Diagnosis Date  . Hyperlipemia   . Arthritis   . Sinus congestion   . Depression   . Post traumatic stress disorder   . Bronchitis   . Fibromyalgia Y-2  . Fibromyalgia unknown   Current Outpatient Prescriptions on File Prior to Visit  Medication Sig Dispense Refill  . albuterol (PROVENTIL HFA) 108 (90 BASE) MCG/ACT inhaler INHALE TWO   PUFFS INTO THE LUNGS EVERY 6 HOURS AS NEEDED FOR WHEEZING. 1 Inhaler 3  . Fluticasone-Salmeterol (ADVAIR)  250-50 MCG/DOSE AEPB Inhale 1 puff into the lungs 2 (two) times daily. 60 each 3  . miconazole (ANTIFUNGAL) 2 % cream Apply topically 2 (two) times daily. 15 g 1  . Misc. Devices (ADJUSTABLE ALUMINUM CANE) MISC 1 each by Does not apply route once. 1 each 0  . RESTASIS 0.05 % ophthalmic emulsion INSTILL 1 DROP INTO BOTH EYES TWICE A DAY AS DIRECTED 1 each 3  . glycerin adult (GLYCERIN ADULT) 2 G SUPP Place 1 suppository rectally once. (Patient not taking: Reported on 01/09/2015) 20 suppository 0   No current facility-administered medications on file prior to visit.   Family History  Problem Relation Age of Onset  . Family history unknown: Yes   Social History   Social History  . Marital Status: Single    Spouse Name: N/A  . Number of Children: N/A  . Years of Education: N/A   Occupational History  . Not on file.   Social History Main Topics  . Smoking status: Current Every Day Smoker -- 0.25 packs/day for 37 years    Types: Cigarettes  . Smokeless tobacco: Not on file  . Alcohol Use: Yes     Comment: occasionally  . Drug Use: No     Comment: crack  . Sexual Activity: Not Currently   Other Topics Concern  . Not on file   Social History Narrative    Review of Systems: Constitutional: Negative  for fever, chills, appetite change, weight loss. Positive for fatigue Skin: Negative for rashes or lesions of concern. HENT: Negative for ear pain, ear discharge.nose bleeds Eyes: Negative for pain, discharge, redness, itching and visual disturbance. Neck: Negative for pain, stiffness Respiratory: Negative for cough, shortness of breath. Positive for occassional wheezing and albuterol use Cardiovascular: Negative for chest pain, palpitations and leg swelling. Gastrointestinal: Negative for abdominal pain, nausea, vomiting, diarrhea, constipations Genitourinary: Negative for dysuria, urgency, frequency, hematuria,  Musculoskeletal: Negative for back pain,  joint  swelling, and gait  problem.Negative for weakness.Positive for chronic knee pain Neurological: Negative for dizziness, tremors, seizures, syncope,   light-headedness, numbness and headaches. Positive for occasionally feeing off balance Hematological: Negative for easy bruising or bleeding Psychiatric/Behavioral: Negative for depression, anxiety, decreased concentration, confusion   Objective:   Filed Vitals:   03/28/15 1501 03/28/15 1510  BP: 145/86 135/92  Pulse: 74   Temp: 98 F (36.7 C)     Physical Exam: Constitutional: Patient appears well-developed and well-nourished. No distress. HENT: Normocephalic, atraumatic, External right and left ear normal. Oropharynx is clear and moist.  Eyes: Conjunctivae and EOM are normal. PERRLA, no scleral icterus. Neck: Normal ROM. Neck supple. No lymphadenopathy, No thyromegaly. CVS: RRR, S1/S2 +, no murmurs, no gallops, no rubs Pulmonary: Effort and breath sounds normal, no stridor, rhonchi, wheezes, rales.  Abdominal: Soft. Normoactive BS,, no distension, tenderness, rebound or guarding.  Musculoskeletal: Normal range of motion. No edema and no tenderness. Walks with a cane, has a limp. Neuro: Alert.Normal muscle tone coordination. Non-focal Skin: Skin is warm and dry. No rash noted. Not diaphoretic. No erythema. No pallor. Psychiatric: Normal mood and affect. Behavior, judgment, thought content normal.  Lab Results  Component Value Date   WBC 7.0 12/23/2014   HGB 13.1 12/23/2014   HCT 39.5 12/23/2014   MCV 80.1 12/23/2014   PLT 225 12/23/2014   Lab Results  Component Value Date   CREATININE 0.63 12/23/2014   BUN 9 12/23/2014   NA 139 12/23/2014   K 3.9 12/23/2014   CL 102 12/23/2014   CO2 25 12/23/2014    Lab Results  Component Value Date   HGBA1C 6.7* 12/23/2014   Lipid Panel     Component Value Date/Time   CHOL 312* 12/23/2014 1212   TRIG 288* 12/23/2014 1212   HDL 70 12/23/2014 1212   CHOLHDL 4.5 12/23/2014 1212   VLDL 58* 12/23/2014  1212   LDLCALC 184* 12/23/2014 1212       Assessment and plan:   1. Essential hypertension  - COMPLETE METABOLIC PANEL WITH GFR - metoprolol succinate (TOPROL-XL) 50 MG 24 hr tablet; Take 2 tablets (100 mg total) by mouth daily. Take with or immediately following a meal.  Dispense: 180 tablet; Refill: 2 - amLODipine (NORVASC) 10 MG tablet; TAKE 1 TABLET (5 MG TOTAL) BY MOUTH DAILY.  Dispense: 90 tablet; Refill: 1  2. Vitamin D deficiency  - Vitamin D 1,25 dihydroxy  3. Tachycardia  - metoprolol succinate (TOPROL-XL) 50 MG 24 hr tablet; Take 2 tablets (100 mg total) by mouth daily. Take with or immediately following a meal.  Dispense: 180 tablet; Refill: 2  4. Neuropathic pain  - gabapentin (NEURONTIN) 300 MG capsule; Take 1 capsule (300 mg total) by mouth 4 (four) times daily.  Dispense: 120 capsule; Refill: 3  5. Arthritis  - celecoxib (CELEBREX) 100 MG capsule; Take 1 capsule (100 mg total) by mouth 2 (two) times daily.  Dispense: 60 capsule; Refill: 1 - Cane  adjustable wide base quad  6. Bipolar affective disorder, current episode mixed, current episode severity unspecified (HCC)  - amitriptyline (ELAVIL) 100 MG tablet; Take 1 tablet (100 mg total) by mouth at bedtime.  Dispense: 90 tablet; Refill: 1 - PARoxetine (PAXIL) 20 MG tablet; Take 1 tablet (20 mg total) by mouth 1 day or 1 dose.  Dispense: 30 tablet; Refill: 0 - lithium carbonate 300 MG capsule; Take 1 capsule (300 mg total) by mouth 3 (three) times daily with meals.  Dispense: 90 capsule; Refill: 0 - lamoTRIgine (LAMICTAL) 25 MG tablet; Take one tablet 2 times a day.  Dispense: 60 tablet; Refill: 0  7. Gastroesophageal reflux disease, esophagitis presence not specified  - pantoprazole (PROTONIX) 40 MG tablet; Take 1 tablet (40 mg total) by mouth 2 (two) times daily.  Dispense: 90 tablet; Refill: 1   8. Pure hypercholesterolemia - simvastatin (ZOCOR) 20 MG tablet; Take 1 tablet (20 mg total) by mouth at bedtime.   Dispense: 90 tablet; Refill: 3  9. Need for PAP smear -Will do at next visit   Return in about 6 months (around 09/25/2015).  The patient was given clear instructions to go to ER or return to medical center if symptoms don't improve, worsen or new problems develop. The patient verbalized understanding.    Henrietta Hoover FNP  03/28/2015, 4:20 PM

## 2015-03-29 ENCOUNTER — Telehealth: Payer: Self-pay | Admitting: *Deleted

## 2015-03-29 LAB — HEPATITIS C ANTIBODY: HCV Ab: NEGATIVE

## 2015-03-29 NOTE — Telephone Encounter (Signed)
Pt called and needs a refill of her vistaril. Pt states she discussed it with [facility] yesterday. I didn't see it on her med list. Please advise Provider. Thanks

## 2015-03-29 NOTE — Telephone Encounter (Signed)
Linda please advise. Thanks!  

## 2015-03-31 ENCOUNTER — Other Ambulatory Visit: Payer: Self-pay | Admitting: Family Medicine

## 2015-03-31 LAB — VITAMIN D 1,25 DIHYDROXY
VITAMIN D 1, 25 (OH) TOTAL: 86 pg/mL — AB (ref 18–72)
Vitamin D2 1, 25 (OH)2: <8 pg/mL
Vitamin D3 1, 25 (OH)2: 86 pg/mL

## 2015-03-31 NOTE — Telephone Encounter (Signed)
With every thing else she is on, I do not feel comfortable adding anything else.

## 2015-04-04 ENCOUNTER — Other Ambulatory Visit: Payer: Self-pay | Admitting: Family Medicine

## 2015-04-11 ENCOUNTER — Other Ambulatory Visit: Payer: Self-pay | Admitting: Family Medicine

## 2015-04-11 ENCOUNTER — Other Ambulatory Visit: Payer: Self-pay

## 2015-04-11 MED ORDER — HYDROXYZINE PAMOATE 25 MG PO CAPS: 25.0000 mg | ORAL_CAPSULE | Freq: Four times a day (QID) | ORAL | Status: DC | PRN

## 2015-04-12 ENCOUNTER — Other Ambulatory Visit: Payer: Self-pay | Admitting: Family Medicine

## 2015-04-21 ENCOUNTER — Other Ambulatory Visit: Payer: Self-pay | Admitting: Family Medicine

## 2015-04-21 DIAGNOSIS — K219 Gastro-esophageal reflux disease without esophagitis: Secondary | ICD-10-CM

## 2015-04-21 DIAGNOSIS — F3160 Bipolar disorder, current episode mixed, unspecified: Secondary | ICD-10-CM

## 2015-04-21 MED ORDER — LAMOTRIGINE 25 MG PO TABS: ORAL_TABLET | ORAL | Status: DC

## 2015-04-21 MED ORDER — PAROXETINE HCL 20 MG PO TABS: 20.0000 mg | ORAL_TABLET | ORAL | Status: DC

## 2015-04-21 MED ORDER — HYDROXYZINE PAMOATE 25 MG PO CAPS: 25.0000 mg | ORAL_CAPSULE | Freq: Four times a day (QID) | ORAL | Status: DC | PRN

## 2015-04-21 MED ORDER — LITHIUM CARBONATE 300 MG PO CAPS: 300.0000 mg | ORAL_CAPSULE | Freq: Three times a day (TID) | ORAL | Status: DC

## 2015-05-02 ENCOUNTER — Ambulatory Visit: Payer: Medicaid Other | Admitting: Family Medicine

## 2015-05-09 ENCOUNTER — Encounter: Payer: Self-pay | Admitting: Family Medicine

## 2015-05-09 ENCOUNTER — Ambulatory Visit (INDEPENDENT_AMBULATORY_CARE_PROVIDER_SITE_OTHER): Payer: Medicaid Other | Admitting: Family Medicine

## 2015-05-09 VITALS — BP 128/70 | HR 76 | Temp 98.3°F | Ht 64.0 in | Wt 270.0 lb

## 2015-05-09 DIAGNOSIS — J45909 Unspecified asthma, uncomplicated: Secondary | ICD-10-CM

## 2015-05-09 DIAGNOSIS — M1990 Unspecified osteoarthritis, unspecified site: Secondary | ICD-10-CM | POA: Diagnosis not present

## 2015-05-09 DIAGNOSIS — R000 Tachycardia, unspecified: Secondary | ICD-10-CM

## 2015-05-09 DIAGNOSIS — I10 Essential (primary) hypertension: Secondary | ICD-10-CM

## 2015-05-09 DIAGNOSIS — F3160 Bipolar disorder, current episode mixed, unspecified: Secondary | ICD-10-CM

## 2015-05-09 DIAGNOSIS — R0683 Snoring: Secondary | ICD-10-CM

## 2015-05-09 DIAGNOSIS — G4700 Insomnia, unspecified: Secondary | ICD-10-CM

## 2015-05-09 MED ORDER — FLUTICASONE-SALMETEROL 500-50 MCG/DOSE IN AEPB: 1.0000 | INHALATION_SPRAY | Freq: Two times a day (BID) | RESPIRATORY_TRACT | Status: DC

## 2015-05-09 MED ORDER — ALBUTEROL SULFATE HFA 108 (90 BASE) MCG/ACT IN AERS: INHALATION_SPRAY | RESPIRATORY_TRACT | Status: DC

## 2015-05-09 MED ORDER — HYDROXYZINE PAMOATE 25 MG PO CAPS: 25.0000 mg | ORAL_CAPSULE | Freq: Four times a day (QID) | ORAL | Status: DC | PRN

## 2015-05-09 MED ORDER — PAROXETINE HCL 20 MG PO TABS
20.0000 mg | ORAL_TABLET | ORAL | Status: DC
Start: 2015-05-09 — End: 2015-06-24

## 2015-05-09 MED ORDER — AMLODIPINE BESYLATE 10 MG PO TABS: ORAL_TABLET | ORAL | Status: DC

## 2015-05-09 MED ORDER — METOPROLOL SUCCINATE ER 50 MG PO TB24: 100.0000 mg | ORAL_TABLET | Freq: Every day | ORAL | Status: DC

## 2015-05-09 MED ORDER — CELECOXIB 100 MG PO CAPS: 100.0000 mg | ORAL_CAPSULE | Freq: Two times a day (BID) | ORAL | Status: DC

## 2015-05-09 MED ORDER — AMITRIPTYLINE HCL 100 MG PO TABS: 100.0000 mg | ORAL_TABLET | Freq: Every day | ORAL | Status: DC

## 2015-05-09 NOTE — Patient Instructions (Signed)
Call medicaid and see if they can refer you to a mental health provider who will take your Medicaid I will put in referral to Sleep Study I will refill your medications later today.

## 2015-05-12 NOTE — Progress Notes (Signed)
Patient ID: Kathleen Herrera, female   DOB: 1960/12/01, 55 y.o.   MRN: [id number]   Kathleen Herrera, is a 55 y.o. female  [id number]  NFA:[id number]  DOB - 06/02/1960  CC:  Chief Complaint  Patient presents with  . sleep study    would like referral for sleep study, had sleep study in [place] 10 + years ago and they said she had sleep apnea, patient states her landloard says she is snoring loudly and that she talks to people in her sleep, also needs refills       HPI: Kathleen Herrera is a 55 y.o. female presents today requesting a sleep study. She reports insomnia, heavy snoring when she does sleep and daytime drowsiness. She reports having a sleep study about 10 years ago, being told she has sleep apnea but never had in follow-up. She is needing refills on several of her chronic medications.She has a diagnosis of Asthma and reports not getting adequate relief with current meds.Her other health problems have been reviewed and are in her problem list.              No Known Allergies Past Medical History  Diagnosis Date  . Hyperlipemia   . Arthritis   . Sinus congestion   . Depression   . Post traumatic stress disorder   . Bronchitis   . Fibromyalgia Y-2  . Fibromyalgia unknown   Current Outpatient Prescriptions on File Prior to Visit  Medication Sig Dispense Refill  . gabapentin (NEURONTIN) 300 MG capsule Take 1 capsule (300 mg total) by mouth 4 (four) times daily. 120 capsule 3  . glycerin adult (GLYCERIN ADULT) 2 G SUPP Place 1 suppository rectally once. 20 suppository 0  . lamoTRIgine (LAMICTAL) 25 MG tablet Take one tablet 2 times a day. 60 tablet 0  . lithium 300 MG tablet TAKE ONE CAPSULE   (300 MG TOTAL ) BY MOUTH   THREE   TIMES DAILY WITH MEALS 90 tablet 0  . lithium carbonate 300 MG capsule Take 1 capsule (300 mg total) by mouth 3 (three) times daily with meals. 90 capsule 0  . miconazole (ANTIFUNGAL) 2 % cream Apply topically 2 (two) times daily. 15 g 1  .  Misc. Devices (ADJUSTABLE ALUMINUM CANE) MISC 1 each by Does not apply route once. 1 each 0  . pantoprazole (PROTONIX) 40 MG tablet Take 1 tablet (40 mg total) by mouth 2 (two) times daily. 90 tablet 1  . RESTASIS 0.05 % ophthalmic emulsion INSTILL 1 DROP INTO BOTH EYES TWICE A DAY AS DIRECTED 1 each 3  . simvastatin (ZOCOR) 20 MG tablet Take 1 tablet (20 mg total) by mouth at bedtime. 90 tablet 3   No current facility-administered medications on file prior to visit.   Family History  Problem Relation Age of Onset  . Family history unknown: Yes   Social History   Social History  . Marital Status: Single    Spouse Name: N/A  . Number of Children: N/A  . Years of Education: N/A   Occupational History  . Not on file.   Social History Main Topics  . Smoking status: Current Every Day Smoker -- 0.25 packs/day for 37 years    Types: Cigarettes  . Smokeless tobacco: Not on file  . Alcohol Use: Yes     Comment: occasionally  . Drug Use: No     Comment: crack  . Sexual Activity: Not Currently   Other Topics Concern  . Not on file  Social History Narrative    Review of Systems: Constitutional: Negative for fever, chills, appetite change, weight loss. Fatigue and daytime sleepiness. Positive for snoring and insomnia Skin: Negative for rashes or lesions of concern. HENT: Negative for ear pain, ear discharge.nose bleeds Eyes: Negative for pain, discharge, redness, itching and visual disturbance. Neck: Negative for pain, stiffness Respiratory: Positive for cough, shortness of breath,   Cardiovascular: Negative for chest pain, palpitations and leg swelling. Gastrointestinal: Negative for abdominal pain, nausea, vomiting, diarrhea, constipations Genitourinary: Negative for dysuria, urgency, frequency, hematuria,  Musculoskeletal: Negative for back pain, joint pain, joint  swelling, and gait problem.Negative for weakness. Neurological: Negative for dizziness, tremors, seizures,  syncope,   light-headedness, numbness and headaches.  Hematological: Negative for easy bruising or bleeding Psychiatric/Behavioral: Negative for depression, anxiety, decreased concentration, confusion   Objective:   Filed Vitals:   05/09/15 1333  BP: 128/70  Pulse: 76  Temp: 98.3 F (36.8 C)    Physical Exam: Constitutional: Patient appears well-developed and well-nourished. No distress. Morbidly obese. HENT: Normocephalic, atraumatic, External right and left ear normal. Oropharynx is clear and moist.  Eyes: Conjunctivae and EOM are normal. PERRLA, no scleral icterus. Neck: Normal ROM. Neck supple. No lymphadenopathy, No thyromegaly. CVS: RRR, S1/S2 +, no murmurs, no gallops, no rubs Pulmonary: Effort and breath sounds normal, no stridor, rhonchi, wheezes, rales.  Abdominal: Soft. Normoactive BS,, no distension, tenderness, rebound or guarding.  Musculoskeletal: Normal range of motion. No edema and no tenderness.  Neuro: Alert.Normal muscle tone coordination. Non-focal Skin: Skin is warm and dry. No rash noted. Not diaphoretic. No erythema. No pallor. Psychiatric: Normal mood and affect. Behavior, judgment, thought content normal.  Lab Results  Component Value Date   WBC 7.0 12/23/2014   HGB 13.1 12/23/2014   HCT 39.5 12/23/2014   MCV 80.1 12/23/2014   PLT 225 12/23/2014   Lab Results  Component Value Date   CREATININE 0.77 03/28/2015   BUN 10 03/28/2015   NA 138 03/28/2015   K 4.3 03/28/2015   CL 101 03/28/2015   CO2 27 03/28/2015    Lab Results  Component Value Date   HGBA1C 6.7* 12/23/2014   Lipid Panel     Component Value Date/Time   CHOL 312* 12/23/2014 1212   TRIG 288* 12/23/2014 1212   HDL 70 12/23/2014 1212   CHOLHDL 4.5 12/23/2014 1212   VLDL 58* 12/23/2014 1212   LDLCALC 184* 12/23/2014 1212       Assessment and plan:   1. Arthritis  - celecoxib (CELEBREX) 100 MG capsule; Take 1 capsule (100 mg total) by mouth 2 (two) times daily.  Dispense:  60 capsule; Refill: 1  2. Essential hypertension  - metoprolol succinate (TOPROL-XL) 50 MG 24 hr tablet; Take 2 tablets (100 mg total) by mouth daily. Take with or immediately following a meal.  Dispense: 180 tablet; Refill: 2 - amLODipine (NORVASC) 10 MG tablet; TAKE 1 TABLET (5 MG TOTAL) BY MOUTH DAILY.  Dispense: 90 tablet; Refill: 1  3. Tachycardia  - metoprolol succinate (TOPROL-XL) 50 MG 24 hr tablet; Take 2 tablets (100 mg total) by mouth daily. Take with or immediately following a meal.  Dispense: 180 tablet; Refill: 2  4. Bipolar affective disorder, current episode mixed, current episode severity unspecified (HCC)  - PARoxetine (PAXIL) 20 MG tablet; Take 1 tablet (20 mg total) by mouth 1 day or 1 dose.  Dispense: 30 tablet; Refill: 0 - amitriptyline (ELAVIL) 100 MG tablet; Take 1 tablet (100 mg total) by mouth  at bedtime.  Dispense: 90 tablet; Refill: 1 -Is to follow up with mental health.  5. Snoring, insomnia daytime sleepiness  - Nocturnal polysomnography (NPSG); Future  6. Asthma, unspecified asthma severity, uncomplicated  - Fluticasone-Salmeterol (ADVAIR DISKUS) 500-50 MCG/DOSE AEPB; Inhale 1 puff into the lungs 2 (two) times daily.  Dispense: 60 each; Refill: 11 - albuterol (PROVENTIL HFA) 108 (90 Base) MCG/ACT inhaler; INHALE TWO   PUFFS INTO THE LUNGS EVERY 6 HOURS AS NEEDED FOR WHEEZING.  Dispense: 1 Inhaler; Refill: 2  7. Insomnia  - hydrOXYzine (VISTARIL) 25 MG capsule; Take 1 capsule (25 mg total) by mouth every 6 (six) hours as needed for anxiety.  Dispense: 30 capsule; Refill: 0   Return in about 6 months (around 11/08/2015).  The patient was given clear instructions to go to ER or return to medical center if symptoms don't improve, worsen or new problems develop. The patient verbalized understanding.    Henrietta HooverLinda C Chianti Goh FNP  05/12/2015, 10:51 AM

## 2015-05-26 ENCOUNTER — Other Ambulatory Visit: Payer: Self-pay | Admitting: Family Medicine

## 2015-05-27 ENCOUNTER — Other Ambulatory Visit: Payer: Self-pay | Admitting: Family Medicine

## 2015-05-27 NOTE — Telephone Encounter (Signed)
She is supposed to be following up with mental health for these refills.

## 2015-05-27 NOTE — Telephone Encounter (Signed)
She is to get these from mental health. I just prescribed until she could get a mental health provider.

## 2015-06-13 ENCOUNTER — Institutional Professional Consult (permissible substitution): Payer: Medicaid Other | Admitting: Pulmonary Disease

## 2015-06-22 ENCOUNTER — Other Ambulatory Visit: Payer: Self-pay | Admitting: Family Medicine

## 2015-06-24 ENCOUNTER — Other Ambulatory Visit: Payer: Self-pay | Admitting: Family Medicine

## 2015-06-24 ENCOUNTER — Telehealth: Payer: Self-pay

## 2015-06-24 DIAGNOSIS — F3160 Bipolar disorder, current episode mixed, unspecified: Secondary | ICD-10-CM

## 2015-06-24 DIAGNOSIS — G4700 Insomnia, unspecified: Secondary | ICD-10-CM

## 2015-06-24 MED ORDER — PAROXETINE HCL 20 MG PO TABS: 20.0000 mg | ORAL_TABLET | ORAL | Status: DC

## 2015-06-24 MED ORDER — HYDROXYZINE PAMOATE 25 MG PO CAPS: 25.0000 mg | ORAL_CAPSULE | Freq: Four times a day (QID) | ORAL | Status: DC | PRN

## 2015-06-24 NOTE — Telephone Encounter (Signed)
Linda, Please advise. Thanks!  

## 2015-06-24 NOTE — Telephone Encounter (Signed)
Pt is requesting medication refills til June 6th on her Paxil and Vistaril. Thanks!

## 2015-06-27 ENCOUNTER — Encounter (HOSPITAL_BASED_OUTPATIENT_CLINIC_OR_DEPARTMENT_OTHER): Payer: Medicaid Other

## 2015-07-01 ENCOUNTER — Other Ambulatory Visit: Payer: Self-pay | Admitting: Family Medicine

## 2015-07-01 ENCOUNTER — Telehealth: Payer: Self-pay

## 2015-07-01 DIAGNOSIS — K219 Gastro-esophageal reflux disease without esophagitis: Secondary | ICD-10-CM

## 2015-07-01 DIAGNOSIS — F3160 Bipolar disorder, current episode mixed, unspecified: Secondary | ICD-10-CM

## 2015-07-01 MED ORDER — LITHIUM CARBONATE 300 MG PO CAPS: 300.0000 mg | ORAL_CAPSULE | Freq: Three times a day (TID) | ORAL | Status: DC

## 2015-07-01 MED ORDER — LAMOTRIGINE 25 MG PO TABS: ORAL_TABLET | ORAL | Status: DC

## 2015-07-01 NOTE — Telephone Encounter (Signed)
Pt is requesting a 1 week refill for the medications: Lamictal and Lithium (capsules 3x/day). Thanks!

## 2015-07-01 NOTE — Telephone Encounter (Signed)
Linda, Can we refill these? Please advise. Thanks!

## 2015-07-12 ENCOUNTER — Telehealth: Payer: Self-pay | Admitting: Pulmonary Disease

## 2015-07-12 ENCOUNTER — Institutional Professional Consult (permissible substitution): Payer: Medicaid Other | Admitting: Pulmonary Disease

## 2015-07-12 NOTE — Telephone Encounter (Signed)
IMAGING CXR PA/LAT 01/13/11 (personally reviewed by me): Elevation of the left hemidiaphragm with low lung volumes. Heart normal in size & mediastinum normal in contour. No pleural effusion or opacity appreciated.  LABS 03/28/15 BMP: 138/4.3/101/27/10/0.77/109/9.6 LFT: 4.2/7.5/0.4/70/26/28

## 2015-07-15 ENCOUNTER — Encounter: Payer: Self-pay | Admitting: Family Medicine

## 2015-07-15 ENCOUNTER — Ambulatory Visit (INDEPENDENT_AMBULATORY_CARE_PROVIDER_SITE_OTHER): Payer: Medicaid Other | Admitting: Family Medicine

## 2015-07-15 VITALS — BP 142/88 | HR 72 | Temp 98.4°F | Resp 20 | Ht 64.0 in | Wt 286.0 lb

## 2015-07-15 DIAGNOSIS — R600 Localized edema: Secondary | ICD-10-CM

## 2015-07-15 DIAGNOSIS — G4700 Insomnia, unspecified: Secondary | ICD-10-CM | POA: Diagnosis not present

## 2015-07-15 MED ORDER — HYDROXYZINE PAMOATE 25 MG PO CAPS
25.0000 mg | ORAL_CAPSULE | Freq: Four times a day (QID) | ORAL | Status: DC | PRN
Start: 2015-07-15 — End: 2015-09-13

## 2015-07-15 MED ORDER — HYDROXYZINE PAMOATE 25 MG PO CAPS: 25.0000 mg | ORAL_CAPSULE | Freq: Four times a day (QID) | ORAL | Status: DC | PRN

## 2015-07-15 MED ORDER — FUROSEMIDE 20 MG PO TABS: 20.0000 mg | ORAL_TABLET | Freq: Every day | ORAL | Status: DC

## 2015-07-15 NOTE — Patient Instructions (Signed)
Will let you know if chest x-ray shows any significant problem.

## 2015-07-15 NOTE — Progress Notes (Signed)
Patient ID: Kathleen Herrera, female   DOB: [birth year], 55 y.o.   MRN: [id number]   Kathleen Herrera, is a 55 y.o. female  [id number]  [id number]  DOB - [birth year]  CC:  Chief Complaint  Patient presents with  . Leg Swelling       HPI: Kathleen Herrera is a 55 y.o. female here complaining of pedal edema for about two weeks. She reports swelling of lower legs and feet from knees down. She reports being slightly more short of breath with exertion. She has no known history of cardiac issues. She does have a history of hypertension and is on amlodipine and metoprolol. She is 16 pounds heavier that her last visit in early April. She denies chest pain or palpitations. She request rx for compression hose. She is not on a diurectic for her BP. She has finally established with a mental health facility to get further refills on her mental health meds.  No Known Allergies Past Medical History  Diagnosis Date  . Hyperlipemia   . Arthritis   . Sinus congestion   . Depression   . Post traumatic stress disorder   . Bronchitis   . Fibromyalgia Y-2  . Fibromyalgia unknown   Current Outpatient Prescriptions on File Prior to Visit  Medication Sig Dispense Refill  . albuterol (PROVENTIL HFA) 108 (90 Base) MCG/ACT inhaler INHALE TWO   PUFFS INTO THE LUNGS EVERY 6 HOURS AS NEEDED FOR WHEEZING. 1 Inhaler 2  . amitriptyline (ELAVIL) 100 MG tablet Take 1 tablet (100 mg total) by mouth at bedtime. 90 tablet 1  . amLODipine (NORVASC) 10 MG tablet TAKE 1 TABLET (5 MG TOTAL) BY MOUTH DAILY. 90 tablet 1  . CELEBREX 100 MG capsule TAKE 1 CAPSULE (100 MG TOTAL) BY MOUTH TWICE DAILY. 60 capsule 2  . Fluticasone-Salmeterol (ADVAIR DISKUS) 500-50 MCG/DOSE AEPB Inhale 1 puff into the lungs 2 (two) times daily. 60 each 11  . gabapentin (NEURONTIN) 300 MG capsule Take 1 capsule (300 mg total) by mouth 4 (four) times daily. 120 capsule 3  . glycerin adult (GLYCERIN ADULT) 2 G SUPP Place 1 suppository rectally  once. 20 suppository 0  . hydrOXYzine (VISTARIL) 25 MG capsule Take 1 capsule (25 mg total) by mouth every 6 (six) hours as needed for anxiety. 30 capsule 0  . lamoTRIgine (LAMICTAL) 25 MG tablet Take one tablet 2 times a day. 60 tablet 0  . lithium carbonate 300 MG capsule Take 1 capsule (300 mg total) by mouth 3 (three) times daily with meals. 90 capsule 0  . metoprolol succinate (TOPROL-XL) 50 MG 24 hr tablet Take 2 tablets (100 mg total) by mouth daily. Take with or immediately following a meal. 180 tablet 2  . miconazole (ANTIFUNGAL) 2 % cream Apply topically 2 (two) times daily. 15 g 1  . Misc. Devices (ADJUSTABLE ALUMINUM CANE) MISC 1 each by Does not apply route once. 1 each 0  . pantoprazole (PROTONIX) 40 MG tablet Take 1 tablet (40 mg total) by mouth 2 (two) times daily. 90 tablet 1  . PARoxetine (PAXIL) 20 MG tablet Take 1 tablet (20 mg total) by mouth 1 day or 1 dose. 30 tablet 0  . RESTASIS 0.05 % ophthalmic emulsion INSTILL 1 DROP INTO BOTH EYES TWICE A DAY AS DIRECTED 1 each 3  . simvastatin (ZOCOR) 20 MG tablet Take 1 tablet (20 mg total) by mouth at bedtime. 90 tablet 3   No current facility-administered medications on file prior to visit.  Family History  Problem Relation Age of Onset  . Family history unknown: Yes   Social History   Social History  . Marital Status: Single    Spouse Name: N/A  . Number of Children: N/A  . Years of Education: N/A   Occupational History  . Not on file.   Social History Main Topics  . Smoking status: Current Every Day Smoker -- 0.25 packs/day for 37 years    Types: Cigarettes  . Smokeless tobacco: Not on file  . Alcohol Use: Yes     Comment: occasionally  . Drug Use: No     Comment: crack  . Sexual Activity: Not Currently   Other Topics Concern  . Not on file   Social History Narrative    Review of Systems: Constitutional: Negative for fever, chills, appetite change, weight loss,  Fatigue. Skin: Negative for rashes or  lesions of concern. HENT: Negative for ear pain, ear discharge.nose bleeds Eyes: Negative for pain, discharge, redness, itching and visual disturbance. Neck: Negative for pain, stiffness Respiratory: Negative for cough. Increased SOB with exertion Cardiovascular: Negative for chest pain, palpitations. Positive for swelling of lower legs and feet. Gastrointestinal: Negative for abdominal pain, nausea, vomiting, diarrhea, constipations Genitourinary: Negative for dysuria, urgency, frequency, hematuria,  Musculoskeletal: Negative for back pain, joint pain, joint  swelling, and gait problem.Negative for weakness. Neurological: Negative for dizziness, tremors, seizures, syncope,   light-headedness, numbness and headaches.  Hematological: Negative for easy bruising or bleeding Psychiatric/Behavioral: Positive for depression   Objective:   Filed Vitals:   07/15/15 1359  BP: 142/88  Pulse: 72  Temp: 98.4 F (36.9 C)  Resp: 20    Physical Exam: Constitutional: Patient appears well-developed and well-nourished. No distress. HENT: Normocephalic, atraumatic, External right and left ear normal. Oropharynx is clear and moist.  Eyes: Conjunctivae and EOM are normal. PERRLA, no scleral icterus. Neck: Normal ROM. Neck supple. No lymphadenopathy, No thyromegaly. CVS: RRR, S1/S2 +, no murmurs, no gallops, no rubs. There is 2-3 + pedal edema. Pulmonary: Effort and breath sounds normal, no stridor, rhonchi, wheezes, rales.  Abdominal: Soft. Normoactive BS,, no distension, tenderness, rebound or guarding.  Musculoskeletal: Normal range of motion. No edema and no tenderness.  Neuro: Alert.Normal muscle tone coordination. Non-focal Skin: Skin is warm and dry. No rash noted. Not diaphoretic. No erythema. No pallor. Psychiatric: Normal mood and affect. Behavior, judgment, thought content normal.  Lab Results  Component Value Date   WBC 7.0 12/23/2014   HGB 13.1 12/23/2014   HCT 39.5 12/23/2014   MCV  80.1 12/23/2014   PLT 225 12/23/2014   Lab Results  Component Value Date   CREATININE 0.77 03/28/2015   BUN 10 03/28/2015   NA 138 03/28/2015   K 4.3 03/28/2015   CL 101 03/28/2015   CO2 27 03/28/2015    Lab Results  Component Value Date   HGBA1C 6.7* 12/23/2014   Lipid Panel     Component Value Date/Time   CHOL 312* 12/23/2014 1212   TRIG 288* 12/23/2014 1212   HDL 70 12/23/2014 1212   CHOLHDL 4.5 12/23/2014 1212   VLDL 58* 12/23/2014 1212   LDLCALC 184* 12/23/2014 1212       Assessment and plan:   1. Pedal edema  - furosemide (LASIX) 20 MG tablet; Take 1 tablet (20 mg total) by mouth daily.  Dispense: 30 tablet; Refill: 3 - DG Chest 2 View; Future - Compression stockings   Return in about 1 week (around 07/22/2015). for weight check.  The patient was given clear instructions to go to ER or return to medical center if symptoms don't improve, worsen or new problems develop. The patient verbalized understanding.    Henrietta Hoover FNP  07/15/2015, 2:26 PM

## 2015-07-19 ENCOUNTER — Other Ambulatory Visit: Payer: Self-pay | Admitting: Family Medicine

## 2015-07-22 ENCOUNTER — Ambulatory Visit: Payer: Medicaid Other

## 2015-08-01 ENCOUNTER — Other Ambulatory Visit: Payer: Self-pay | Admitting: Family Medicine

## 2015-08-05 ENCOUNTER — Ambulatory Visit (HOSPITAL_BASED_OUTPATIENT_CLINIC_OR_DEPARTMENT_OTHER): Payer: Medicaid Other | Attending: Family Medicine

## 2015-09-13 ENCOUNTER — Telehealth: Payer: Self-pay

## 2015-09-13 DIAGNOSIS — I10 Essential (primary) hypertension: Secondary | ICD-10-CM

## 2015-09-13 DIAGNOSIS — G4700 Insomnia, unspecified: Secondary | ICD-10-CM

## 2015-09-13 MED ORDER — AMLODIPINE BESYLATE 10 MG PO TABS: ORAL_TABLET | ORAL | 1 refills | Status: DC

## 2015-09-13 MED ORDER — HYDROXYZINE PAMOATE 25 MG PO CAPS: 25.0000 mg | ORAL_CAPSULE | Freq: Four times a day (QID) | ORAL | 0 refills | Status: DC | PRN

## 2015-09-13 NOTE — Telephone Encounter (Signed)
Refills sent into pharmacy. Thanks!  

## 2015-09-19 ENCOUNTER — Emergency Department (HOSPITAL_COMMUNITY): Payer: Medicaid Other

## 2015-09-19 ENCOUNTER — Inpatient Hospital Stay (HOSPITAL_COMMUNITY)
Admission: EM | Admit: 2015-09-19 | Discharge: 2015-09-23 | DRG: 193 | Disposition: A | Discharge status: Home / Self Care | Payer: Medicaid Other | Attending: Internal Medicine | Admitting: Internal Medicine

## 2015-09-19 ENCOUNTER — Encounter (HOSPITAL_COMMUNITY): Payer: Self-pay | Admitting: Emergency Medicine

## 2015-09-19 DIAGNOSIS — F3181 Bipolar II disorder: Secondary | ICD-10-CM | POA: Diagnosis present

## 2015-09-19 DIAGNOSIS — Z66 Do not resuscitate: Secondary | ICD-10-CM | POA: Diagnosis present

## 2015-09-19 DIAGNOSIS — R0902 Hypoxemia: Secondary | ICD-10-CM

## 2015-09-19 DIAGNOSIS — E669 Obesity, unspecified: Secondary | ICD-10-CM | POA: Diagnosis present

## 2015-09-19 DIAGNOSIS — E785 Hyperlipidemia, unspecified: Secondary | ICD-10-CM | POA: Diagnosis present

## 2015-09-19 DIAGNOSIS — Z6841 Body Mass Index (BMI) 40.0 and over, adult: Secondary | ICD-10-CM | POA: Diagnosis not present

## 2015-09-19 DIAGNOSIS — J189 Pneumonia, unspecified organism: Principal | ICD-10-CM

## 2015-09-19 DIAGNOSIS — J9601 Acute respiratory failure with hypoxia: Secondary | ICD-10-CM

## 2015-09-19 DIAGNOSIS — R0600 Dyspnea, unspecified: Secondary | ICD-10-CM | POA: Diagnosis not present

## 2015-09-19 DIAGNOSIS — F17210 Nicotine dependence, cigarettes, uncomplicated: Secondary | ICD-10-CM | POA: Diagnosis present

## 2015-09-19 DIAGNOSIS — Z79899 Other long term (current) drug therapy: Secondary | ICD-10-CM

## 2015-09-19 DIAGNOSIS — J181 Lobar pneumonia, unspecified organism: Secondary | ICD-10-CM | POA: Diagnosis not present

## 2015-09-19 DIAGNOSIS — J45909 Unspecified asthma, uncomplicated: Secondary | ICD-10-CM

## 2015-09-19 DIAGNOSIS — R0602 Shortness of breath: Secondary | ICD-10-CM | POA: Diagnosis present

## 2015-09-19 DIAGNOSIS — Z720 Tobacco use: Secondary | ICD-10-CM

## 2015-09-19 DIAGNOSIS — I10 Essential (primary) hypertension: Secondary | ICD-10-CM | POA: Diagnosis present

## 2015-09-19 DIAGNOSIS — M797 Fibromyalgia: Secondary | ICD-10-CM | POA: Diagnosis present

## 2015-09-19 HISTORY — DX: Essential (primary) hypertension: I10

## 2015-09-19 LAB — URINALYSIS, ROUTINE W REFLEX MICROSCOPIC
BILIRUBIN URINE: NEGATIVE
GLUCOSE, UA: NEGATIVE mg/dL
HGB URINE DIPSTICK: NEGATIVE
Ketones, ur: NEGATIVE mg/dL
Leukocytes, UA: NEGATIVE
Nitrite: NEGATIVE
PROTEIN: NEGATIVE mg/dL
SPECIFIC GRAVITY, URINE: 1.006 (ref 1.005–1.030)
pH: 7 (ref 5.0–8.0)

## 2015-09-19 LAB — CBC WITH DIFFERENTIAL/PLATELET
BASOS ABS: 0 10*3/uL (ref 0.0–0.1)
BASOS PCT: 0 %
EOS PCT: 1 %
Eosinophils Absolute: 0.1 10*3/uL (ref 0.0–0.7)
HCT: 38.5 % (ref 36.0–46.0)
Hemoglobin: 12 g/dL (ref 12.0–15.0)
Lymphocytes Relative: 19 %
Lymphs Abs: 1.8 10*3/uL (ref 0.7–4.0)
MCH: 26.1 pg (ref 26.0–34.0)
MCHC: 31.2 g/dL (ref 30.0–36.0)
MCV: 83.9 fL (ref 78.0–100.0)
MONO ABS: 0.4 10*3/uL (ref 0.1–1.0)
MONOS PCT: 5 %
Neutro Abs: 7.3 10*3/uL (ref 1.7–7.7)
Neutrophils Relative %: 75 %
PLATELETS: 226 10*3/uL (ref 150–400)
RBC: 4.59 MIL/uL (ref 3.87–5.11)
RDW: 16.8 % — AB (ref 11.5–15.5)
WBC: 9.7 10*3/uL (ref 4.0–10.5)

## 2015-09-19 LAB — LITHIUM LEVEL: Lithium Lvl: 0.49 mmol/L — ABNORMAL LOW (ref 0.60–1.20)

## 2015-09-19 LAB — BASIC METABOLIC PANEL
Anion gap: 7 (ref 5–15)
BUN: 7 mg/dL (ref 6–20)
CALCIUM: 9.4 mg/dL (ref 8.9–10.3)
CO2: 28 mmol/L (ref 22–32)
CREATININE: 0.64 mg/dL (ref 0.44–1.00)
Chloride: 105 mmol/L (ref 101–111)
GFR calc Af Amer: >60 mL/min (ref >60)
GLUCOSE: 106 mg/dL — AB (ref 65–99)
Potassium: 4 mmol/L (ref 3.5–5.1)
Sodium: 140 mmol/L (ref 135–145)

## 2015-09-19 LAB — LACTIC ACID, PLASMA
LACTIC ACID, VENOUS: 1.8 mmol/L (ref 0.5–1.9)
Lactic Acid, Venous: 1.4 mmol/L (ref 0.5–1.9)

## 2015-09-19 LAB — TROPONIN I: Troponin I: <0.03 ng/mL (ref <0.03)

## 2015-09-19 LAB — BRAIN NATRIURETIC PEPTIDE: B NATRIURETIC PEPTIDE 5: 195.6 pg/mL — AB (ref 0.0–100.0)

## 2015-09-19 LAB — D-DIMER, QUANTITATIVE (NOT AT ARMC): D DIMER QUANT: 0.73 ug{FEU}/mL — AB (ref 0.00–0.50)

## 2015-09-19 LAB — TSH: TSH: 4.004 u[IU]/mL (ref 0.350–4.500)

## 2015-09-19 MED ORDER — ALBUTEROL SULFATE (2.5 MG/3ML) 0.083% IN NEBU
2.5000 mg | INHALATION_SOLUTION | RESPIRATORY_TRACT | Status: DC | PRN
  Administered 2015-09-20: 2.5 mg via RESPIRATORY_TRACT
  Filled 2015-09-19 (×2): qty 3

## 2015-09-19 MED ORDER — PAROXETINE HCL 20 MG PO TABS
20.0000 mg | ORAL_TABLET | Freq: Every day | ORAL | Status: DC
  Administered 2015-09-20 – 2015-09-23 (×4): 20 mg via ORAL
  Filled 2015-09-19 (×4): qty 1

## 2015-09-19 MED ORDER — ENSURE ENLIVE PO LIQD
237.0000 mL | Freq: Two times a day (BID) | ORAL | Status: DC
  Administered 2015-09-20 – 2015-09-23 (×6): 237 mL via ORAL

## 2015-09-19 MED ORDER — CEFTRIAXONE SODIUM 1 G IJ SOLR
1.0000 g | Freq: Once | INTRAMUSCULAR | Status: AC
  Administered 2015-09-19: 1 g via INTRAVENOUS
  Filled 2015-09-19: qty 10

## 2015-09-19 MED ORDER — PANTOPRAZOLE SODIUM 40 MG PO TBEC
40.0000 mg | DELAYED_RELEASE_TABLET | Freq: Two times a day (BID) | ORAL | Status: DC
  Administered 2015-09-20 – 2015-09-23 (×8): 40 mg via ORAL
  Filled 2015-09-19 (×8): qty 1

## 2015-09-19 MED ORDER — FUROSEMIDE 20 MG PO TABS
20.0000 mg | ORAL_TABLET | Freq: Every day | ORAL | Status: DC
  Administered 2015-09-20: 20 mg via ORAL
  Filled 2015-09-19: qty 1

## 2015-09-19 MED ORDER — GABAPENTIN 300 MG PO CAPS
300.0000 mg | ORAL_CAPSULE | Freq: Four times a day (QID) | ORAL | Status: DC
  Administered 2015-09-20 – 2015-09-23 (×14): 300 mg via ORAL
  Filled 2015-09-19 (×14): qty 1

## 2015-09-19 MED ORDER — AMLODIPINE BESYLATE 10 MG PO TABS
10.0000 mg | ORAL_TABLET | Freq: Every day | ORAL | Status: DC
  Administered 2015-09-20: 10 mg via ORAL
  Filled 2015-09-19: qty 1

## 2015-09-19 MED ORDER — DEXTROSE 5 % IV SOLN
1.0000 g | INTRAVENOUS | Status: DC
  Administered 2015-09-20 – 2015-09-22 (×3): 1 g via INTRAVENOUS
  Filled 2015-09-19 (×4): qty 10

## 2015-09-19 MED ORDER — CYCLOSPORINE 0.05 % OP EMUL
1.0000 [drp] | Freq: Two times a day (BID) | OPHTHALMIC | Status: DC
  Administered 2015-09-20 – 2015-09-23 (×8): 1 [drp] via OPHTHALMIC
  Filled 2015-09-19 (×12): qty 1

## 2015-09-19 MED ORDER — LITHIUM CARBONATE 300 MG PO CAPS
300.0000 mg | ORAL_CAPSULE | Freq: Three times a day (TID) | ORAL | Status: DC
  Administered 2015-09-20 – 2015-09-23 (×9): 300 mg via ORAL
  Filled 2015-09-19 (×14): qty 1

## 2015-09-19 MED ORDER — DEXTROSE 5 % IV SOLN
500.0000 mg | Freq: Once | INTRAVENOUS | Status: AC
  Administered 2015-09-20: 500 mg via INTRAVENOUS
  Filled 2015-09-19: qty 500

## 2015-09-19 MED ORDER — LAMOTRIGINE 25 MG PO TABS
25.0000 mg | ORAL_TABLET | Freq: Two times a day (BID) | ORAL | Status: DC
  Administered 2015-09-20 – 2015-09-23 (×8): 25 mg via ORAL
  Filled 2015-09-19 (×8): qty 1

## 2015-09-19 MED ORDER — SIMVASTATIN 20 MG PO TABS
20.0000 mg | ORAL_TABLET | Freq: Every day | ORAL | Status: DC
  Administered 2015-09-20 – 2015-09-22 (×4): 20 mg via ORAL
  Filled 2015-09-19 (×4): qty 1

## 2015-09-19 MED ORDER — MOMETASONE FURO-FORMOTEROL FUM 200-5 MCG/ACT IN AERO
2.0000 | INHALATION_SPRAY | Freq: Two times a day (BID) | RESPIRATORY_TRACT | Status: DC
  Administered 2015-09-20 – 2015-09-23 (×8): 2 via RESPIRATORY_TRACT
  Filled 2015-09-19: qty 8.8

## 2015-09-19 MED ORDER — RISPERIDONE 2 MG PO TABS
4.0000 mg | ORAL_TABLET | Freq: Two times a day (BID) | ORAL | Status: DC
  Administered 2015-09-20 – 2015-09-22 (×4): 4 mg via ORAL
  Filled 2015-09-19 (×5): qty 2
  Filled 2015-09-19: qty 8
  Filled 2015-09-19: qty 2

## 2015-09-19 MED ORDER — ENOXAPARIN SODIUM 40 MG/0.4ML ~~LOC~~ SOLN
40.0000 mg | Freq: Every day | SUBCUTANEOUS | Status: DC
  Administered 2015-09-20 – 2015-09-22 (×4): 40 mg via SUBCUTANEOUS
  Filled 2015-09-19 (×3): qty 0.4

## 2015-09-19 MED ORDER — AZITHROMYCIN 500 MG IV SOLR
500.0000 mg | Freq: Once | INTRAVENOUS | Status: AC
  Administered 2015-09-19: 500 mg via INTRAVENOUS
  Filled 2015-09-19: qty 500

## 2015-09-19 MED ORDER — IPRATROPIUM-ALBUTEROL 0.5-2.5 (3) MG/3ML IN SOLN: 3.0000 mL | Freq: Four times a day (QID) | RESPIRATORY_TRACT | Status: DC

## 2015-09-19 MED ORDER — METOPROLOL SUCCINATE ER 100 MG PO TB24
100.0000 mg | ORAL_TABLET | Freq: Every day | ORAL | Status: DC
  Administered 2015-09-20 – 2015-09-23 (×4): 100 mg via ORAL
  Filled 2015-09-19 (×4): qty 1

## 2015-09-19 NOTE — ED Triage Notes (Signed)
Per EMS pt complaint of cough, SOB, and generalized aches for five days.

## 2015-09-19 NOTE — ED Notes (Signed)
Patient oxygen at rest ranges from 83-93%

## 2015-09-19 NOTE — ED Notes (Signed)
Patient is aware urine sample is needed. Patient states she is unable to void at this time. Patient encouraged to void when able.

## 2015-09-19 NOTE — ED Notes (Signed)
PA wants me to hold off with collecting lactic acid until the admitting MD comes to see patient

## 2015-09-19 NOTE — ED Notes (Signed)
Ambulated pt in hallway. O2 sat stayed between 89-91% most of the time but was able to get up to 100% towards the end of the walk.

## 2015-09-19 NOTE — ED Provider Notes (Signed)
[facility]-EMERGENCY DEPT Provider Note   CSN: [id number] Arrival date & time: 09/19/15  1535     History   Chief Complaint Chief Complaint  Patient presents with  . Shortness of Breath  . Cough    HPI Kathleen Herrera is a 55 y.o. female.  Kathleen Herrera is a 55 y.o. female with history of fibromyalgia, HLD, HTN, obesity, Bipolar 2 disorder, arthritis presents to ED with complaint of shortness of breath and cough. Patient reports she has had a cough for a few weeks. States cough is wet; however, non-productive. She started experiencing SOB and DOE approximately 2 days that has progressively worsened prompting her to come to the ED for evaluation. When EMS arrived patient O2 sat 84% on RA, which improved to 97% during transport. In ED with transfer from wheelchair to bed, patient decreased sats to 79%. Patient placed on [place] O2. She has associated chills, subjective fever, diaphoresis, myalgias, wheezing, URI like symptoms, and lightheadness. She denies chest pain, abdominal complaints, urinary complaints, or syncope. No treatments tried. She denies recent long distance travel/surgery/immobilization, no hemoptysis, no h/o blood clot, no h/o cancer, no hormone therapy. No h/o sickle cell disease.        Past Medical History:  Diagnosis Date  . Arthritis   . Bronchitis   . Depression   . Fibromyalgia Y-2  . Fibromyalgia unknown  . Hyperlipemia   . Hypertension   . Post traumatic stress disorder   . Sinus congestion     Patient Active Problem List   Diagnosis Date Noted  . CAP (community acquired pneumonia) 09/19/2015  . Abnormality of gait 06/29/2014  . Essential hypertension 06/29/2014  . Hyperlipidemia 06/29/2014  . Metabolic syndrome 06/29/2014  . Neuropathic pain 06/29/2014  . Gastroesophageal reflux disease without esophagitis 06/29/2014  . Tachycardia 06/29/2014  . At risk for osteopenia 06/29/2014  . Excessive daytime sleepiness 06/29/2014  . Fibromyalgia   .  Abnormal LFTs 03/06/2013  . Extreme obesity (HCC) 12/18/2012  . Fatty infiltration of liver 07/15/2012  . Gonalgia 07/15/2012  . LBP (low back pain) 07/15/2012  . Arthritis, degenerative 06/12/2012  . Arthritis 05/26/2012  . Bipolar 2 disorder (HCC) 04/17/2012  . Clinical depression 04/17/2012    Past Surgical History:  Procedure Laterality Date  . ABDOMINAL HYSTERECTOMY      OB History    No data available       Home Medications    Prior to Admission medications   Medication Sig Start Date End Date Taking? Authorizing Provider  albuterol (PROVENTIL HFA;VENTOLIN HFA) 108 (90 Base) MCG/ACT inhaler Inhale 2 puffs into the lungs every 6 (six) hours as needed for wheezing or shortness of breath.   Yes Historical Provider, MD  amLODipine (NORVASC) 10 MG tablet Take 10 mg by mouth daily.   Yes Historical Provider, MD  celecoxib (CELEBREX) 100 MG capsule Take 100 mg by mouth 2 (two) times daily.   Yes Historical Provider, MD  cycloSPORINE (RESTASIS) 0.05 % ophthalmic emulsion Place 1 drop into both eyes 2 (two) times daily.   Yes Historical Provider, MD  Fluticasone-Salmeterol (ADVAIR DISKUS) 500-50 MCG/DOSE AEPB Inhale 1 puff into the lungs 2 (two) times daily. 05/09/15  Yes Henrietta HooverLinda C Bernhardt, NP  furosemide (LASIX) 20 MG tablet Take 1 tablet (20 mg total) by mouth daily. 07/15/15  Yes Henrietta HooverLinda C Bernhardt, NP  gabapentin (NEURONTIN) 300 MG capsule Take 300 mg by mouth 4 (four) times daily.   Yes Historical Provider, MD  hydrOXYzine (VISTARIL) 25  MG capsule Take 1 capsule (25 mg total) by mouth every 6 (six) hours as needed for anxiety. 09/13/15  Yes Henrietta HooverLinda C Bernhardt, NP  lamoTRIgine (LAMICTAL) 25 MG tablet Take 25 mg by mouth 2 (two) times daily.   Yes Historical Provider, MD  lithium carbonate 300 MG capsule Take 300 mg by mouth 3 (three) times daily with meals.   Yes Historical Provider, MD  metoprolol succinate (TOPROL-XL) 50 MG 24 hr tablet Take 2 tablets (100 mg total) by mouth daily.  Take with or immediately following a meal. 05/09/15  Yes Henrietta HooverLinda C Bernhardt, NP  pantoprazole (PROTONIX) 40 MG tablet Take 1 tablet (40 mg total) by mouth 2 (two) times daily. 03/28/15  Yes Henrietta HooverLinda C Bernhardt, NP  PARoxetine (PAXIL) 20 MG tablet Take 20 mg by mouth daily.   Yes Historical Provider, MD  risperidone (RISPERDAL) 4 MG tablet Take 4 mg by mouth 2 (two) times daily.   Yes Historical Provider, MD  simvastatin (ZOCOR) 20 MG tablet Take 1 tablet (20 mg total) by mouth at bedtime. 03/28/15  Yes Henrietta HooverLinda C Bernhardt, NP    Family History Family History  Problem Relation Age of Onset  . Dementia Mother   . Prostate cancer Father   . Dementia Father   . Prostate cancer Other   . CAD Other     Social History Social History  Substance Use Topics  . Smoking status: Current Every Day Smoker    Packs/day: 0.25    Years: 37.00    Types: Cigarettes  . Smokeless tobacco: Never Used  . Alcohol use Yes     Comment: occasionally     Allergies   Review of patient's allergies indicates no known allergies.   Review of Systems Review of Systems  Constitutional: Positive for chills, diaphoresis and fever ( subjective).  HENT: Positive for congestion, rhinorrhea and sore throat.   Eyes: Negative for visual disturbance.  Respiratory: Positive for cough, shortness of breath and wheezing.   Cardiovascular: Positive for leg swelling ( b/l). Negative for chest pain.  Gastrointestinal: Negative for abdominal pain, blood in stool, constipation, diarrhea, nausea and vomiting.  Genitourinary: Negative for dysuria and hematuria.  Musculoskeletal: Negative for arthralgias and myalgias.  Skin: Negative for rash.  Allergic/Immunologic: Positive for environmental allergies.  Neurological: Positive for light-headedness. Negative for syncope.     Physical Exam Updated Vital Signs BP 144/87 (BP Location: Right Arm)   Pulse 75   Temp 99.6 F (37.6 C) (Oral)   Resp 20   Ht 5\' 3"  (1.6 m)   Wt 122.5  kg   SpO2 98%   BMI 47.83 kg/m   Physical Exam  Constitutional: She appears well-developed and well-nourished. No distress.  HENT:  Head: Normocephalic and atraumatic.  Mouth/Throat: Oropharynx is clear and moist. No oropharyngeal exudate.  Eyes: Conjunctivae and EOM are normal. Pupils are equal, round, and reactive to light. Right eye exhibits no discharge. Left eye exhibits no discharge. No scleral icterus.  Neck: Normal range of motion. Neck supple.  Cardiovascular: Normal rate, regular rhythm, normal heart sounds and intact distal pulses.   No murmur heard. Pulmonary/Chest: Tachypnea noted. No respiratory distress. She has wheezes ( right). She has rhonchi ( right).  Abdominal: Soft. Bowel sounds are normal. There is no tenderness. There is no rebound and no guarding.  Musculoskeletal: Normal range of motion. She exhibits edema and tenderness.  1+ b/l lower extremity swelling. Mild TTP of posterior calf b/l. Negative Homan's sign. Intact distal pulses.  Lymphadenopathy:    She has no cervical adenopathy.  Neurological: She is alert. Coordination normal.  Skin: Skin is warm and dry. She is not diaphoretic.  Psychiatric: She has a normal mood and affect. Her behavior is normal.     ED Treatments / Results  Labs (all labs ordered are listed, but only abnormal results are displayed) Labs Reviewed  BASIC METABOLIC PANEL - Abnormal; Notable for the following:       Result Value   Glucose, Bld 106 (*)    All other components within normal limits  CBC WITH DIFFERENTIAL/PLATELET - Abnormal; Notable for the following:    RDW 16.8 (*)    All other components within normal limits  URINE CULTURE  CULTURE, BLOOD (ROUTINE X 2)  CULTURE, BLOOD (ROUTINE X 2)  LACTIC ACID, PLASMA  URINALYSIS, ROUTINE W REFLEX MICROSCOPIC (NOT AT [facility])  LACTIC ACID, PLASMA  D-DIMER, QUANTITATIVE (NOT AT [facility])  TROPONIN I  TROPONIN I  TROPONIN I  BRAIN NATRIURETIC PEPTIDE  TSH    EKG  EKG  Interpretation None       Radiology Dg Chest 2 View  Result Date: 09/19/2015 CLINICAL DATA:  Cough and shortness of Breath EXAM: CHEST  2 VIEW COMPARISON:  01/13/2011 FINDINGS: Cardiac shadow is within normal limits. The lungs are well aerated bilaterally. The left lung is clear. Patchy infiltrative changes are noted throughout the right lung. No bony abnormality is noted. IMPRESSION: Patchy infiltrates throughout the right lung. Followup PA and lateral chest X-ray is recommended in 3-4 weeks following trial of antibiotic therapy to ensure resolution and exclude underlying malignancy. Electronically Signed   By: Alcide Clever M.D.   On: 09/19/2015 16:18    Procedures Procedures (including critical care time)  Medications Ordered in ED Medications  cefTRIAXone (ROCEPHIN) 1 g in dextrose 5 % 50 mL IVPB (0 g Intravenous Stopped 09/19/15 1930)  azithromycin (ZITHROMAX) 500 mg in dextrose 5 % 250 mL IVPB (500 mg Intravenous New Bag/Given 09/19/15 1934)     Initial Impression / Assessment and Plan / ED Course  I have reviewed the triage vital signs and the nursing notes.  Pertinent labs & imaging results that were available during my care of the patient were reviewed by me and considered in my medical decision making (see chart for details).  Clinical Course  Value Comment By Time  DG Chest 2 View Normal cardiac silhouette. Patchy infiltrate noted in right lung.  Lona Kettle, [place] 08/14 2121    Patient is afebrile and non-toxic appearing. She is tachypneic and sating at 96% on 2L O2. Physical exam remarkable for decrease lung sounds and rhonchi on right. She has 1+ b/l lower extremity edema.   CXR remarkable for patchy infiltrate in right lung. Will check basic labs. IV ABX for CAP initiated. Will likely need to be admited for further management of CAP and new O2 demand.   Lactic acid nml. CBC and BMP unremarkable. U/A negative for UTI. Patient sats dropped to upper 70s on RA while  ambulating. Consult for admission.   9:00 PM: Spoke with Dr. Adela Glimpse, greatly appreciated her time and input. Agrees to admit patient for further management of CAP and new O2 demand.   Final Clinical Impressions(s) / ED Diagnoses   Final diagnoses:  CAP (community acquired pneumonia)  Hypoxia    New Prescriptions New Prescriptions   No medications on file     Lona Kettle, Cordelia Poche 09/19/15 2123    Rolland Porter, MD 09/20/15  1157  

## 2015-09-19 NOTE — ED Notes (Signed)
Pt's O2 sat dropped to 87-88%.

## 2015-09-19 NOTE — H&P (Signed)
Kathleen Herrera UXL:[id number] DOB: 30-Sep-1960 DOA: 09/19/2015     PCP: Concepcion Living, NP   Outpatient Specialists: pulmonology Nester  Patient coming from:    home Lives alone,        Chief Complaint: Shortness of breath and subjective fevers  HPI: Kathleen Herrera is a 55 y.o. female with medical history significant of HTN, HL fibromyalgia, Bipolar depression.  chronic bronchitis  Presented with shortness of breath and cough for past 5 days associated with generalized aches she had some sore throat. Not sure of any sick contacts. Reports chills. No significant weight loss. Cough has been going on for the past few weeks  has been wet but nonproductive but for the past 2 days she has started to have more shortness of breath and dyspnea on exertion which point she decided to come to emergency department when EMS arrived patient was satting 84% room air once she arrived to emergency department her oxygen saturation dropped down to 79 when she was trying to transfer from wheelchair to bed she denies any chest pains but have had associated subjective fevers and chills she feels occasional wheezing have had URIs like symptoms associated lightheadedness no chest pain no syncope no recent travel Regarding pertinent Chronic problems: Patient has history of hypertension maintained on amlodipine and metoprolol Of note the past 3 months patient developed pedal edema and was started on Lasix Patient has hx of "bronchitis" on Advair.   IN ER: Temperature 99.6 respirations 25 oxygen saturation as low 79 but currently up to 98 heart rate 74 blood pressure 148/92 Cranial 0.64 WBC 9.7 hemoglobin 12 plt 226 lactic acid 1.4 Chest x-ray showing patchy infiltrates in the right lung  Hospitalist was called for admission for CAP  Review of Systems:    Pertinent positives include: Fevers, chills, fatigue, shortness of breath at rest dyspnea on exertion,  non-productive cough,  wheezing. Constitutional:  No weight loss, night sweats,  weight loss  HEENT:  No headaches, Difficulty swallowing,Tooth/dental problems,Sore throat,  No sneezing, itching, ear ache, nasal congestion, post nasal drip,  Cardio-vascular:  No chest pain, Orthopnea, PND, anasarca, dizziness, palpitations.no Bilateral lower extremity swelling  GI:  No heartburn, indigestion, abdominal pain, nausea, vomiting, diarrhea, change in bowel habits, loss of appetite, melena, blood in stool, hematemesis Resp:     No coughing up of blood.No change in color of mucus.No Skin:  no rash or lesions. No jaundice GU:  no dysuria, change in color of urine, no urgency or frequency. No straining to urinate.  No flank pain.  Musculoskeletal:  No joint pain or no joint swelling. No decreased range of motion. No back pain.  Psych:  No change in mood or affect. No depression or anxiety. No memory loss.  Neuro: no localizing neurological complaints, no tingling, no weakness, no double vision, no gait abnormality, no slurred speech, no confusion  As per HPI otherwise 10 point review of systems negative.   Past Medical History: Past Medical History:  Diagnosis Date  . Arthritis   . Bronchitis   . Depression   . Fibromyalgia Y-2  . Fibromyalgia unknown  . Hyperlipemia   . Post traumatic stress disorder   . Sinus congestion    Past Surgical History:  Procedure Laterality Date  . ABDOMINAL HYSTERECTOMY       Social History:  Ambulatory  independently      reports that she has been smoking Cigarettes.  She has a 9.25 pack-year smoking history. She has never  used smokeless tobacco. She reports that she drinks alcohol. She reports that she does not use drugs.  Allergies:  No Known Allergies     Family History:   Family History  Problem Relation Age of Onset  . Dementia Mother   . Prostate cancer Father   . Dementia Father   . Prostate cancer Other   . CAD Other     Medications: Prior to  Admission medications   Medication Sig Start Date End Date Taking? Authorizing Provider  albuterol (PROVENTIL HFA;VENTOLIN HFA) 108 (90 Base) MCG/ACT inhaler Inhale 2 puffs into the lungs every 6 (six) hours as needed for wheezing or shortness of breath.   Yes Historical Provider, MD  amLODipine (NORVASC) 10 MG tablet Take 10 mg by mouth daily.   Yes Historical Provider, MD  celecoxib (CELEBREX) 100 MG capsule Take 100 mg by mouth 2 (two) times daily.   Yes Historical Provider, MD  cycloSPORINE (RESTASIS) 0.05 % ophthalmic emulsion Place 1 drop into both eyes 2 (two) times daily.   Yes Historical Provider, MD  Fluticasone-Salmeterol (ADVAIR DISKUS) 500-50 MCG/DOSE AEPB Inhale 1 puff into the lungs 2 (two) times daily. 05/09/15  Yes Henrietta HooverLinda C Bernhardt, NP  furosemide (LASIX) 20 MG tablet Take 1 tablet (20 mg total) by mouth daily. 07/15/15  Yes Henrietta HooverLinda C Bernhardt, NP  gabapentin (NEURONTIN) 300 MG capsule Take 300 mg by mouth 4 (four) times daily.   Yes Historical Provider, MD  hydrOXYzine (VISTARIL) 25 MG capsule Take 1 capsule (25 mg total) by mouth every 6 (six) hours as needed for anxiety. 09/13/15  Yes Henrietta HooverLinda C Bernhardt, NP  lamoTRIgine (LAMICTAL) 25 MG tablet Take 25 mg by mouth 2 (two) times daily.   Yes Historical Provider, MD  lithium carbonate 300 MG capsule Take 300 mg by mouth 3 (three) times daily with meals.   Yes Historical Provider, MD  metoprolol succinate (TOPROL-XL) 50 MG 24 hr tablet Take 2 tablets (100 mg total) by mouth daily. Take with or immediately following a meal. 05/09/15  Yes Henrietta HooverLinda C Bernhardt, NP  pantoprazole (PROTONIX) 40 MG tablet Take 1 tablet (40 mg total) by mouth 2 (two) times daily. 03/28/15  Yes Henrietta HooverLinda C Bernhardt, NP  PARoxetine (PAXIL) 20 MG tablet Take 20 mg by mouth daily.   Yes Historical Provider, MD  risperidone (RISPERDAL) 4 MG tablet Take 4 mg by mouth 2 (two) times daily.   Yes Historical Provider, MD  simvastatin (ZOCOR) 20 MG tablet Take 1 tablet (20 mg total) by  mouth at bedtime. 03/28/15  Yes Henrietta HooverLinda C Bernhardt, NP    Physical Exam: Patient Vitals for the past 24 hrs:  BP Temp Temp src Pulse Resp SpO2 Height Weight  09/19/15 2032 122/88 - - 74 25 98 % - -  09/19/15 1830 124/80 - - 69 22 95 % - -  09/19/15 1812 - - - 73 - 91 % - -  09/19/15 1812 - - - - - (!) 84 % - -  09/19/15 1810 - - - - - 93 % - -  09/19/15 1809 134/77 - - 75 20 (!) 85 % - -  09/19/15 1730 131/80 - - 74 24 94 % - -  09/19/15 1652 144/87 - - 75 20 98 % - -  09/19/15 1651 - - - - - - 5\' 3"  (1.6 m) 122.5 kg (270 lb)  09/19/15 1644 - - - - - 93 % - -  09/19/15 1644 - - - - - Marland Kitchen(!)  79 % - -  09/19/15 1545 148/92 99.6 F (37.6 C) Oral 78 11 96 % - -  09/19/15 1540 - - - - - 99 % - -    1. General:  in No Acute distress 2. Psychological: Alert and    Oriented 3. Head/ENT:    Dry Mucous Membranes                          Head Non traumatic, neck supple                            Poor Dentition 4. SKIN:  decreased Skin turgor,  Skin clean Dry and intact no rash 5. Heart: Regular rate and rhythm no  Murmur, Rub or gallop 6. Lungs: some  wheezes and crackles   7. Abdomen: Soft, non-tender, Non distended 8. Lower extremities: no clubbing, cyanosis, or edema 9. Neurologically Grossly intact, moving all 4 extremities equally 10. MSK: Normal range of motion   body mass index is 47.83 kg/m.  Labs on Admission:   Labs on Admission: I have personally reviewed following labs and imaging studies  CBC:  Recent Labs Lab 09/19/15 1834  WBC 9.7  NEUTROABS 7.3  HGB 12.0  HCT 38.5  MCV 83.9  PLT 226   Basic Metabolic Panel:  Recent Labs Lab 09/19/15 1834  NA 140  K 4.0  CL 105  CO2 28  GLUCOSE 106*  BUN 7  CREATININE 0.64  CALCIUM 9.4   GFR: Estimated Creatinine Clearance: 99.7 mL/min (by C-G formula based on SCr of 0.8 mg/dL). Liver Function Tests: No results for input(s): AST, ALT, ALKPHOS, BILITOT, PROT, ALBUMIN in the last 168 hours. No results for  input(s): LIPASE, AMYLASE in the last 168 hours. No results for input(s): AMMONIA in the last 168 hours. Coagulation Profile: No results for input(s): INR, PROTIME in the last 168 hours. Cardiac Enzymes: No results for input(s): CKTOTAL, CKMB, CKMBINDEX, TROPONINI in the last 168 hours. BNP (last 3 results) No results for input(s): PROBNP in the last 8760 hours. HbA1C: No results for input(s): HGBA1C in the last 72 hours. CBG: No results for input(s): GLUCAP in the last 168 hours. Lipid Profile: No results for input(s): CHOL, HDL, LDLCALC, TRIG, CHOLHDL, LDLDIRECT in the last 72 hours. Thyroid Function Tests: No results for input(s): TSH, T4TOTAL, FREET4, T3FREE, THYROIDAB in the last 72 hours. Anemia Panel: No results for input(s): VITAMINB12, FOLATE, FERRITIN, TIBC, IRON, RETICCTPCT in the last 72 hours. Urine analysis:    Component Value Date/Time   COLORURINE YELLOW 09/19/2015 1818   APPEARANCEUR CLEAR 09/19/2015 1818   LABSPEC 1.006 09/19/2015 1818   PHURINE 7.0 09/19/2015 1818   GLUCOSEU NEGATIVE 09/19/2015 1818   HGBUR NEGATIVE 09/19/2015 1818   BILIRUBINUR NEGATIVE 09/19/2015 1818   KETONESUR NEGATIVE 09/19/2015 1818   PROTEINUR NEGATIVE 09/19/2015 1818   UROBILINOGEN 1.0 01/06/2014 1550   NITRITE NEGATIVE 09/19/2015 1818   LEUKOCYTESUR NEGATIVE 09/19/2015 1818   Sepsis Labs: @LABRCNTIP (procalcitonin:4,lacticidven:4) )No results found for this or any previous visit (from the past 240 hour(s)).    UA   no evidence of UTI  Lab Results  Component Value Date   HGBA1C 6.7 (H) 12/23/2014    Estimated Creatinine Clearance: 99.7 mL/min (by C-G formula based on SCr of 0.8 mg/dL).  BNP (last 3 results) No results for input(s): PROBNP in the last 8760 hours.   ECG REPORT  Independently reviewed  Rate: 74  Rhythm: Low voltage but sinus rhythm ST&T Change: No acute ischemic changes  QTC 458  Filed Weights   09/19/15 1651  Weight: 122.5 kg (270 lb)      Cultures: No results found for: SDES, SPECREQUEST, CULT, REPTSTATUS   Radiological Exams on Admission: Dg Chest 2 View  Result Date: 09/19/2015 CLINICAL DATA:  Cough and shortness of Breath EXAM: CHEST  2 VIEW COMPARISON:  01/13/2011 FINDINGS: Cardiac shadow is within normal limits. The lungs are well aerated bilaterally. The left lung is clear. Patchy infiltrative changes are noted throughout the right lung. No bony abnormality is noted. IMPRESSION: Patchy infiltrates throughout the right lung. Followup PA and lateral chest X-ray is recommended in 3-4 weeks following trial of antibiotic therapy to ensure resolution and exclude underlying malignancy. Electronically Signed   By: Alcide CleverMark  Lukens M.D.   On: 09/19/2015 16:18    Chart has been reviewed    Assessment/Plan  55 y.o. female with medical history significant of HTN, HL fibromyalgia, Bipolar depression. Being admitted for acute respiratory 30 hypoxia loss likely secondary to community-acquired pneumonia     Present on Admission: . Acute respiratory failure with hypoxia (HCC) most likely secondary to community-acquired pneumonia, patient has had history of leg edema and shortness of breath has been empirically started on Lasix will check BNP, d-dimer and echo gram . CAP (community acquired pneumonia) -  - will admit for treatment of CAP will start on appropriate antibiotic coverage. Given atypical distribution will obtain viral panel   Obtain sputum cultures, blood cultures if febrile or if decompensates.  Provide oxygen as needed.  . Bipolar 2 disorder (HCC) - continue home medications check lithium level . Essential hypertension - stable continue home medications . Hyperlipidemia - stable continue home medications . Dyspnea - most likely secondary to community-acquired pneumonia but given persistent etiology and somewhat abnormal EKG with small QRS as well as history of questionable bronchitis will need to father evaluation.  Continued delirium and home inhalers Will need outpatient PFTs tobacco abuse: spoke to patient regarding cessation  Other plan as per orders.  DVT prophylaxis:    Lovenox     Code Status:   DNR/DNI  As per patient   Family Communication:   Family not  at  Bedside    Disposition Plan: To home once workup is complete and patient is stable   Consults called: none   Admission status:    inpatient        Level of care   tele       I have spent a total of 56 min on this admission   Emma Birchler 09/19/2015, 9:30 PM    [facility]  Pager [phone]   after 2 AM please page floor coverage PA If 7AM-7PM, please contact the day team taking care of the patient  [email or web address]  Password [email or web address]

## 2015-09-19 NOTE — ED Notes (Signed)
Patient transported to X-ray 

## 2015-09-20 ENCOUNTER — Inpatient Hospital Stay (HOSPITAL_COMMUNITY): Payer: Medicaid Other

## 2015-09-20 DIAGNOSIS — R0600 Dyspnea, unspecified: Secondary | ICD-10-CM

## 2015-09-20 DIAGNOSIS — J181 Lobar pneumonia, unspecified organism: Secondary | ICD-10-CM

## 2015-09-20 LAB — CBC WITH DIFFERENTIAL/PLATELET
BASOS ABS: 0 10*3/uL (ref 0.0–0.1)
BASOS PCT: 0 %
Eosinophils Absolute: 0.1 10*3/uL (ref 0.0–0.7)
Eosinophils Relative: 1 %
HEMATOCRIT: 34.8 % — AB (ref 36.0–46.0)
HEMOGLOBIN: 10.9 g/dL — AB (ref 12.0–15.0)
Lymphocytes Relative: 23 %
Lymphs Abs: 2 10*3/uL (ref 0.7–4.0)
MCH: 26.5 pg (ref 26.0–34.0)
MCHC: 31.3 g/dL (ref 30.0–36.0)
MCV: 84.7 fL (ref 78.0–100.0)
Monocytes Absolute: 0.8 10*3/uL (ref 0.1–1.0)
Monocytes Relative: 9 %
NEUTROS ABS: 5.9 10*3/uL (ref 1.7–7.7)
NEUTROS PCT: 67 %
Platelets: 220 10*3/uL (ref 150–400)
RBC: 4.11 MIL/uL (ref 3.87–5.11)
RDW: 17.2 % — ABNORMAL HIGH (ref 11.5–15.5)
WBC: 8.8 10*3/uL (ref 4.0–10.5)

## 2015-09-20 LAB — RESPIRATORY PANEL BY PCR
ADENOVIRUS-RVPPCR: NOT DETECTED
BORDETELLA PERTUSSIS-RVPCR: NOT DETECTED
CORONAVIRUS 229E-RVPPCR: NOT DETECTED
Chlamydophila pneumoniae: NOT DETECTED
Coronavirus HKU1: NOT DETECTED
Coronavirus NL63: NOT DETECTED
Coronavirus OC43: NOT DETECTED
INFLUENZA A H1 2009-RVPPR: NOT DETECTED
INFLUENZA A H1-RVPPCR: NOT DETECTED
Influenza A H3: NOT DETECTED
Influenza A: NOT DETECTED
Influenza B: NOT DETECTED
METAPNEUMOVIRUS-RVPPCR: NOT DETECTED
MYCOPLASMA PNEUMONIAE-RVPPCR: NOT DETECTED
PARAINFLUENZA VIRUS 2-RVPPCR: NOT DETECTED
Parainfluenza Virus 1: NOT DETECTED
Parainfluenza Virus 3: NOT DETECTED
Parainfluenza Virus 4: NOT DETECTED
RESPIRATORY SYNCYTIAL VIRUS-RVPPCR: NOT DETECTED
Rhinovirus / Enterovirus: NOT DETECTED

## 2015-09-20 LAB — EXPECTORATED SPUTUM ASSESSMENT W REFEX TO RESP CULTURE: SPECIAL REQUESTS: NORMAL

## 2015-09-20 LAB — COMPREHENSIVE METABOLIC PANEL
ALK PHOS: 68 U/L (ref 38–126)
ALT: 16 U/L (ref 14–54)
ANION GAP: 7 (ref 5–15)
AST: 16 U/L (ref 15–41)
Albumin: 3.8 g/dL (ref 3.5–5.0)
BILIRUBIN TOTAL: 0.8 mg/dL (ref 0.3–1.2)
BUN: 6 mg/dL (ref 6–20)
CALCIUM: 8.9 mg/dL (ref 8.9–10.3)
CO2: 28 mmol/L (ref 22–32)
Chloride: 104 mmol/L (ref 101–111)
Creatinine, Ser: 0.78 mg/dL (ref 0.44–1.00)
GFR calc non Af Amer: >60 mL/min (ref >60)
Glucose, Bld: 125 mg/dL — ABNORMAL HIGH (ref 65–99)
Potassium: 3.8 mmol/L (ref 3.5–5.1)
SODIUM: 139 mmol/L (ref 135–145)
TOTAL PROTEIN: 7.1 g/dL (ref 6.5–8.1)

## 2015-09-20 LAB — MRSA PCR SCREENING: MRSA by PCR: NEGATIVE

## 2015-09-20 LAB — STREP PNEUMONIAE URINARY ANTIGEN: Strep Pneumo Urinary Antigen: NEGATIVE

## 2015-09-20 LAB — ECHOCARDIOGRAM COMPLETE
HEIGHTINCHES: 63.5 in
WEIGHTICAEL: 1988.8 [oz_av]

## 2015-09-20 LAB — TROPONIN I

## 2015-09-20 LAB — EXPECTORATED SPUTUM ASSESSMENT W GRAM STAIN, RFLX TO RESP C

## 2015-09-20 LAB — HIV ANTIBODY (ROUTINE TESTING W REFLEX): HIV Screen 4th Generation wRfx: NONREACTIVE

## 2015-09-20 MED ORDER — FUROSEMIDE 40 MG PO TABS
40.0000 mg | ORAL_TABLET | Freq: Two times a day (BID) | ORAL | Status: DC
  Administered 2015-09-20 – 2015-09-23 (×6): 40 mg via ORAL
  Filled 2015-09-20 (×6): qty 1

## 2015-09-20 MED ORDER — ACETAMINOPHEN 325 MG PO TABS
650.0000 mg | ORAL_TABLET | Freq: Four times a day (QID) | ORAL | Status: DC | PRN
  Administered 2015-09-20 (×2): 650 mg via ORAL
  Filled 2015-09-20 (×2): qty 2

## 2015-09-20 MED ORDER — IBUPROFEN 200 MG PO TABS
400.0000 mg | ORAL_TABLET | Freq: Four times a day (QID) | ORAL | Status: DC | PRN
  Administered 2015-09-20 – 2015-09-22 (×3): 400 mg via ORAL
  Filled 2015-09-20 (×3): qty 2

## 2015-09-20 MED ORDER — IPRATROPIUM-ALBUTEROL 0.5-2.5 (3) MG/3ML IN SOLN
3.0000 mL | Freq: Three times a day (TID) | RESPIRATORY_TRACT | Status: DC
  Administered 2015-09-20 – 2015-09-23 (×8): 3 mL via RESPIRATORY_TRACT
  Filled 2015-09-20 (×10): qty 3

## 2015-09-20 NOTE — Progress Notes (Signed)
Nutrition Brief Note  Patient identified on the Malnutrition Screening Tool (MST) Report  Wt Readings from Last 15 Encounters:  09/19/15 124 lb 4.8 oz (56.4 kg)  07/15/15 286 lb (129.7 kg)  05/09/15 270 lb (122.5 kg)  03/28/15 262 lb (118.8 kg)  01/09/15 254 lb (115.2 kg)  12/23/14 259 lb (117.5 kg)  08/27/14 254 lb (115.2 kg)  06/28/14 254 lb (115.2 kg)  01/13/11 260 lb (117.9 kg)    Body mass index is 21.67 kg/m. Question current body weight based on pt habitus, pt denies changes in appetite or weight loss PTA. Weight on admission recorded as 270 lbs (122.5 kg). CBW likely recorded in lbs (124 lbs) rather than kg (124 kg).   Current diet order is Heart Healthy, patient is consuming approximately 100% of meals at this time. Labs and medications reviewed; 20 mg oral Lasix BID.   No nutrition interventions warranted at this time. If nutrition issues arise, please consult RD.     Trenton GammonJessica Agapita Savarino, MS, RD, LDN Inpatient Clinical Dietitian Pager # [phone] After hours/weekend pager # [phone]

## 2015-09-20 NOTE — Progress Notes (Signed)
PROGRESS NOTE                                                                                                                                                                                                             Patient Demographics:    Kathleen Herrera, is a 55 y.o. female, DOB - 11/18/1960, RUE:[id number]  Admit date - 09/19/2015   Admitting Physician Therisa DoyneAnastassia Doutova, MD  Outpatient Primary MD for the patient is Concepcion LivingBERNHARDT, LINDA, NP  LOS - 1  Outpatient Specialists:None  Chief Complaint  Patient presents with  . Shortness of Breath  . Cough       Brief Narrative   55 year old female with a history of hypertension, fibromyalgia, bipolar depression presented with generalized body ache, shortness of breath and cough for past 5 days. She was found to have multilobar right-sided pneumonia with hypoxia in the ED (O2 sat 79% on room air). Admitted for acute hypoxic respiratory failure secondary to lobar pneumonia and possible CHF.   Subjective:   Breathing slightly better. Still has some cough.   Assessment  & Plan :   Principal problem   Acute respiratory failure with hypoxia (HCC) Secondary to lobar pneumonia and CHF. Symptoms better this morning. Continue O2 via nasal cannula. Empiric Rocephin and azithromycin. Follow cultures. Supportive care with antitussives and Tylenol.   Active Problems: Multilobar pneumonia Chest x-ray shows patchy infiltrates throughout right lung. Empiric antibiotics. Recommend follow-up chest x-ray in 3-4 weeks to ensure resolution and exclude underlying malignancy..  Acute congestive heart failure Patient reports having leg swellings for a while and her PCP was plan to put her on diabetic. BNP in 200s. Will place her on oral Lasix twice a day. Continue Toprol. Check 2-D echo.     Essential hypertension Continue Toprol. Added Lasix. hold amlodipine given leg edema.   Hyperlipidemia  Continue statin.    Bipolar 2 disorder (HCC) Stable. Continue home medications        Code Status : DO NOT RESUSCITATE  Family Communication  : None at bedside  Disposition Plan  : Home possibly in 48 hours  Barriers For Discharge : Active symptoms  Consults  : None  Procedures  : 2-D echo  DVT Prophylaxis  :  Lovenox -  Lab Results  Component Value Date   PLT 220 09/20/2015    Antibiotics  :  Anti-infectives    Start     Dose/Rate Route Frequency Ordered Stop   09/20/15 2000  azithromycin (ZITHROMAX) 500 mg in dextrose 5 % 250 mL IVPB     500 mg 250 mL/hr over 60 Minutes Intravenous  Once 09/19/15 2308     09/20/15 1900  cefTRIAXone (ROCEPHIN) 1 g in dextrose 5 % 50 mL IVPB     1 g 100 mL/hr over 30 Minutes Intravenous Every 24 hours 09/19/15 2308     09/19/15 1830  cefTRIAXone (ROCEPHIN) 1 g in dextrose 5 % 50 mL IVPB     1 g 100 mL/hr over 30 Minutes Intravenous  Once 09/19/15 1817 09/19/15 1930   09/19/15 1830  azithromycin (ZITHROMAX) 500 mg in dextrose 5 % 250 mL IVPB     500 mg 250 mL/hr over 60 Minutes Intravenous  Once 09/19/15 1817 09/19/15 2202        Objective:   Vitals:   09/20/15 0025 09/20/15 0032 09/20/15 0611 09/20/15 0916  BP:   116/62   Pulse:   66   Resp:   15   Temp:   (!) 96.7 F (35.9 C)   TempSrc:   Oral   SpO2: 98% 98% 97% 95%  Weight:      Height:        Wt Readings from Last 3 Encounters:  09/19/15 56.4 kg (124 lb 4.8 oz)  07/15/15 129.7 kg (286 lb)  05/09/15 122.5 kg (270 lb)     Intake/Output Summary (Last 24 hours) at 09/20/15 1152 Last data filed at 09/20/15 0806  Gross per 24 hour  Intake              600 ml  Output             1600 ml  Net            -1000 ml     Physical Exam  Gen: Middle aged obese female not in distress HEENT:  moist mucosa, supple neck, no JVD Chest: Fine bibasilar crackles, no rhonchi or wheeze CVS: N S1&S2, no murmurs, rubs or gallop GI: soft, NT, ND,  BS+ Musculoskeletal: warm, 2+ pitting edema bilaterally CNS: Alert and oriented    Data Review:    CBC  Recent Labs Lab 09/19/15 1834 09/20/15 0429  WBC 9.7 8.8  HGB 12.0 10.9*  HCT 38.5 34.8*  PLT 226 220  MCV 83.9 84.7  MCH 26.1 26.5  MCHC 31.2 31.3  RDW 16.8* 17.2*  LYMPHSABS 1.8 2.0  MONOABS 0.4 0.8  EOSABS 0.1 0.1  BASOSABS 0.0 0.0    Chemistries   Recent Labs Lab 09/19/15 1834 09/20/15 0429  NA 140 139  K 4.0 3.8  CL 105 104  CO2 28 28  GLUCOSE 106* 125*  BUN 7 6  CREATININE 0.64 0.78  CALCIUM 9.4 8.9  AST  --  16  ALT  --  16  ALKPHOS  --  68  BILITOT  --  0.8   ------------------------------------------------------------------------------------------------------------------ No results for input(s): CHOL, HDL, LDLCALC, TRIG, CHOLHDL, LDLDIRECT in the last 72 hours.  Lab Results  Component Value Date   HGBA1C 6.7 (H) 12/23/2014   ------------------------------------------------------------------------------------------------------------------  Recent Labs  09/19/15 2216  TSH 4.004   ------------------------------------------------------------------------------------------------------------------ No results for input(s): VITAMINB12, FOLATE, FERRITIN, TIBC, IRON, RETICCTPCT in the last 72 hours.  Coagulation profile No results for input(s): INR, PROTIME in the last 168 hours.   Recent Labs  09/19/15 2116  DDIMER 0.73*  Cardiac Enzymes  Recent Labs Lab 09/19/15 2116 09/20/15 0429  TROPONINI <0.03 <0.03   ------------------------------------------------------------------------------------------------------------------    Component Value Date/Time   BNP 195.6 (H) 09/19/2015 2216    Inpatient Medications  Scheduled Meds: . amLODipine  10 mg Oral Daily  . azithromycin  500 mg Intravenous Once  . cefTRIAXone (ROCEPHIN)  IV  1 g Intravenous Q24H  . cycloSPORINE  1 drop Both Eyes BID  . enoxaparin (LOVENOX) injection  40 mg  Subcutaneous QHS  . feeding supplement (ENSURE ENLIVE)  237 mL Oral BID BM  . furosemide  20 mg Oral Daily  . gabapentin  300 mg Oral QID  . ipratropium-albuterol  3 mL Nebulization TID  . lamoTRIgine  25 mg Oral BID  . lithium carbonate  300 mg Oral TID WC  . metoprolol succinate  100 mg Oral Daily  . mometasone-formoterol  2 puff Inhalation BID  . pantoprazole  40 mg Oral BID  . PARoxetine  20 mg Oral Daily  . risperidone  4 mg Oral BID  . simvastatin  20 mg Oral QHS   Continuous Infusions:  PRN Meds:.acetaminophen, albuterol  Micro Results Recent Results (from the past 240 hour(s))  Respiratory Panel by PCR     Status: None   Collection Time: 09/20/15  1:15 AM  Result Value Ref Range Status   Adenovirus NOT DETECTED NOT DETECTED Final   Coronavirus 229E NOT DETECTED NOT DETECTED Final   Coronavirus HKU1 NOT DETECTED NOT DETECTED Final   Coronavirus NL63 NOT DETECTED NOT DETECTED Final   Coronavirus OC43 NOT DETECTED NOT DETECTED Final   Metapneumovirus NOT DETECTED NOT DETECTED Final   Rhinovirus / Enterovirus NOT DETECTED NOT DETECTED Final   Influenza A NOT DETECTED NOT DETECTED Final   Influenza A H1 NOT DETECTED NOT DETECTED Final   Influenza A H1 2009 NOT DETECTED NOT DETECTED Final   Influenza A H3 NOT DETECTED NOT DETECTED Final   Influenza B NOT DETECTED NOT DETECTED Final   Parainfluenza Virus 1 NOT DETECTED NOT DETECTED Final   Parainfluenza Virus 2 NOT DETECTED NOT DETECTED Final   Parainfluenza Virus 3 NOT DETECTED NOT DETECTED Final   Parainfluenza Virus 4 NOT DETECTED NOT DETECTED Final   Respiratory Syncytial Virus NOT DETECTED NOT DETECTED Final   Bordetella pertussis NOT DETECTED NOT DETECTED Final   Chlamydophila pneumoniae NOT DETECTED NOT DETECTED Final   Mycoplasma pneumoniae NOT DETECTED NOT DETECTED Final  MRSA PCR Screening     Status: None   Collection Time: 09/20/15  5:15 AM  Result Value Ref Range Status   MRSA by PCR NEGATIVE NEGATIVE Final      Comment:        The GeneXpert MRSA Assay (FDA approved for NASAL specimens only), is one component of a comprehensive MRSA colonization surveillance program. It is not intended to diagnose MRSA infection nor to guide or monitor treatment for MRSA infections.   Culture, sputum-assessment     Status: None   Collection Time: 09/20/15  8:41 AM  Result Value Ref Range Status   Specimen Description SPUTUM  Final   Special Requests Normal  Final   Sputum evaluation   Final    THIS SPECIMEN IS ACCEPTABLE. RESPIRATORY CULTURE REPORT TO FOLLOW.   Report Status 09/20/2015 FINAL  Final  Culture, respiratory (NON-Expectorated)     Status: None (Preliminary result)   Collection Time: 09/20/15  8:41 AM  Result Value Ref Range Status   Specimen Description SPUTUM  Final  Special Requests NONE  Final   Gram Stain   Final    ABUNDANT SQUAMOUS EPITHELIAL CELLS PRESENT MODERATE WBC PRESENT,BOTH PMN AND MONONUCLEAR MODERATE GRAM POSITIVE COCCI IN PAIRS MODERATE GRAM VARIABLE ROD FEW YEAST    Culture PENDING  Incomplete   Report Status PENDING  Incomplete    Radiology Reports Dg Chest 2 View  Result Date: 09/19/2015 CLINICAL DATA:  Cough and shortness of Breath EXAM: CHEST  2 VIEW COMPARISON:  01/13/2011 FINDINGS: Cardiac shadow is within normal limits. The lungs are well aerated bilaterally. The left lung is clear. Patchy infiltrative changes are noted throughout the right lung. No bony abnormality is noted. IMPRESSION: Patchy infiltrates throughout the right lung. Followup PA and lateral chest X-ray is recommended in 3-4 weeks following trial of antibiotic therapy to ensure resolution and exclude underlying malignancy. Electronically Signed   By: Alcide CleverMark  Lukens M.D.   On: 09/19/2015 16:18    Time Spent in minutes  25   Eddie NorthHUNGEL, Johnpaul Gillentine M.D on 09/20/2015 at 11:52 AM  Between 7am to 7pm - Pager - [phone]  After 7pm go to [email or web address] - password [email or web address]  [facility] -  Office   [phone]

## 2015-09-20 NOTE — Progress Notes (Signed)
Patient has a headache. PCP was notified for orders. Awaiting a response/new orders.

## 2015-09-20 NOTE — Progress Notes (Signed)
  Echocardiogram 2D Echocardiogram has been performed.  Cathie BeamsGREGORY, Asucena Galer 09/20/2015, 12:37 PM

## 2015-09-21 DIAGNOSIS — F3181 Bipolar II disorder: Secondary | ICD-10-CM

## 2015-09-21 LAB — BASIC METABOLIC PANEL
Anion gap: 9 (ref 5–15)
BUN: 7 mg/dL (ref 6–20)
CALCIUM: 8.7 mg/dL — AB (ref 8.9–10.3)
CO2: 29 mmol/L (ref 22–32)
CREATININE: 0.68 mg/dL (ref 0.44–1.00)
Chloride: 100 mmol/L — ABNORMAL LOW (ref 101–111)
GFR calc Af Amer: >60 mL/min (ref >60)
GFR calc non Af Amer: >60 mL/min (ref >60)
GLUCOSE: 155 mg/dL — AB (ref 65–99)
Potassium: 4 mmol/L (ref 3.5–5.1)
Sodium: 138 mmol/L (ref 135–145)

## 2015-09-21 LAB — URINE CULTURE: Culture: NO GROWTH

## 2015-09-21 MED ORDER — DEXTROSE 5 % IV SOLN
500.0000 mg | INTRAVENOUS | Status: DC
  Administered 2015-09-21 – 2015-09-22 (×2): 500 mg via INTRAVENOUS
  Filled 2015-09-21 (×3): qty 500

## 2015-09-21 MED ORDER — AMLODIPINE BESYLATE 10 MG PO TABS
10.0000 mg | ORAL_TABLET | Freq: Every day | ORAL | Status: DC
  Administered 2015-09-21 – 2015-09-23 (×3): 10 mg via ORAL
  Filled 2015-09-21 (×3): qty 1

## 2015-09-21 NOTE — Progress Notes (Signed)
Pt ambulated 4890ft with O2 stat staying greater than 90% on room air.  After 6890ft patient breathing became labored and O2 stat dropped to 87% on room air.  Pt stated that she felt "out of breath". Annelise Mccoy A

## 2015-09-21 NOTE — Progress Notes (Signed)
Patient ID: Kathleen Herrera, female   DOB: 10/20/1960, 55 y.o.   MRN: [id number]    PROGRESS NOTE    Kathleen Herrera  NWG:[id number] DOB: 10/20/1960 DOA: 09/19/2015  PCP: Concepcion LivingBERNHARDT, LINDA, NP   Outpatient Specialists:   Brief Narrative:  55 y.o. female with medical history significant of HTN, HL fibromyalgia, Bipolar depression, Presented with shortness of breath and cough for past 5 days associated with generalized aches she had some sore throat. Not sure of any sick contacts. Reports chills. No significant weight loss.  Assessment & Plan:   RLL PNA, CAP, unknown pathogen - continue zithromax and rocephin day #2 - follow up on cultures sputum, strep pneumo and urine legionella - narrow ABX in 24 hours if pt continues to improve   Essential HTN - continue home medical regimen  Bipolar depression  - stable - continue home medical regimen   DVT prophylaxis: SCD Code Status: DNR Family Communication: Patient at bedside  Disposition Plan: Home in 48 hours  Consultants:   None   Procedures:   None   Antimicrobials:   Zithro and Rocephin 8/15 -->   Subjective: Still with exertional dyspnea.   Objective: Vitals:   09/20/15 1951 09/20/15 2204 09/21/15 0202 09/21/15 0540  BP:  122/69 115/64 117/63  Pulse:  71 65 62  Resp:  19 18 18   Temp:  98.9 F (37.2 C) 98 F (36.7 C) 98 F (36.7 C)  TempSrc:  Oral Oral Oral  SpO2: 98% 95% 97% 100%  Weight:      Height:        Intake/Output Summary (Last 24 hours) at 09/21/15 0801 Last data filed at 09/21/15 0000  Gross per 24 hour  Intake              890 ml  Output             2200 ml  Net            -1310 ml   Filed Weights   09/19/15 1651 09/19/15 2257  Weight: 122.5 kg (270 lb) 56.4 kg (124 lb 4.8 oz)    Examination:  General exam: Appears calm and comfortable  Respiratory system: Respiratory effort normal. Some rhonchi at the right lower lobe area  Cardiovascular system: S1 & S2 heard, RRR. No JVD,  murmurs, rubs, gallops or clicks. No pedal edema. Gastrointestinal system: Abdomen is nondistended, soft and nontender. No organomegaly or masses felt. Normal bowel sounds heard. Central nervous system: Alert and oriented. No focal neurological deficits. Extremities: Symmetric 5 x 5 power. Skin: No rashes, lesions or ulcers Psychiatry: Judgement and insight appear normal. Mood & affect appropriate.    Data Reviewed: I have personally reviewed following labs and imaging studies  CBC:  Recent Labs Lab 09/19/15 1834 09/20/15 0429  WBC 9.7 8.8  NEUTROABS 7.3 5.9  HGB 12.0 10.9*  HCT 38.5 34.8*  MCV 83.9 84.7  PLT 226 220   Basic Metabolic Panel:  Recent Labs Lab 09/19/15 1834 09/20/15 0429 09/21/15 0600  NA 140 139 138  K 4.0 3.8 4.0  CL 105 104 100*  CO2 28 28 29   GLUCOSE 106* 125* 155*  BUN 7 6 7   CREATININE 0.64 0.78 0.68  CALCIUM 9.4 8.9 8.7*   GFR: Estimated Creatinine Clearance: 66.4 mL/min (by C-G formula based on SCr of 0.8 mg/dL). Liver Function Tests:  Recent Labs Lab 09/20/15 0429  AST 16  ALT 16  ALKPHOS 68  BILITOT 0.8  PROT 7.1  ALBUMIN 3.8   Cardiac Enzymes:  Recent Labs Lab 09/19/15 2116 09/20/15 0429 09/20/15 1056  TROPONINI <0.03 <0.03 <0.03   Thyroid Function Tests:  Recent Labs  09/19/15 2216  TSH 4.004   Urine analysis:    Component Value Date/Time   COLORURINE YELLOW 09/19/2015 1818   APPEARANCEUR CLEAR 09/19/2015 1818   LABSPEC 1.006 09/19/2015 1818   PHURINE 7.0 09/19/2015 1818   GLUCOSEU NEGATIVE 09/19/2015 1818   HGBUR NEGATIVE 09/19/2015 1818   BILIRUBINUR NEGATIVE 09/19/2015 1818   KETONESUR NEGATIVE 09/19/2015 1818   PROTEINUR NEGATIVE 09/19/2015 1818   UROBILINOGEN 1.0 01/06/2014 1550   NITRITE NEGATIVE 09/19/2015 1818   LEUKOCYTESUR NEGATIVE 09/19/2015 1818   Recent Results (from the past 240 hour(s))  Culture, blood (routine x 2)     Status: None (Preliminary result)   Collection Time: 09/19/15  6:35 PM   Result Value Ref Range Status   Specimen Description BLOOD LEFT ARM  Final   Special Requests BOTTLES DRAWN AEROBIC AND ANAEROBIC 5CC  Final   Culture   Final    NO GROWTH < 24 HOURS Performed at [facility]    Report Status PENDING  Incomplete  Culture, blood (routine x 2)     Status: None (Preliminary result)   Collection Time: 09/19/15  6:45 PM  Result Value Ref Range Status   Specimen Description BLOOD RIGHT ARM  Final   Special Requests BOTTLES DRAWN AEROBIC AND ANAEROBIC 5CC  Final   Culture   Final    NO GROWTH < 24 HOURS Performed at [facility]    Report Status PENDING  Incomplete  Respiratory Panel by PCR     Status: None   Collection Time: 09/20/15  1:15 AM  Result Value Ref Range Status   Adenovirus NOT DETECTED NOT DETECTED Final   Coronavirus 229E NOT DETECTED NOT DETECTED Final   Coronavirus HKU1 NOT DETECTED NOT DETECTED Final   Coronavirus NL63 NOT DETECTED NOT DETECTED Final   Coronavirus OC43 NOT DETECTED NOT DETECTED Final   Metapneumovirus NOT DETECTED NOT DETECTED Final   Rhinovirus / Enterovirus NOT DETECTED NOT DETECTED Final   Influenza A NOT DETECTED NOT DETECTED Final   Influenza A H1 NOT DETECTED NOT DETECTED Final   Influenza A H1 2009 NOT DETECTED NOT DETECTED Final   Influenza A H3 NOT DETECTED NOT DETECTED Final   Influenza B NOT DETECTED NOT DETECTED Final   Parainfluenza Virus 1 NOT DETECTED NOT DETECTED Final   Parainfluenza Virus 2 NOT DETECTED NOT DETECTED Final   Parainfluenza Virus 3 NOT DETECTED NOT DETECTED Final   Parainfluenza Virus 4 NOT DETECTED NOT DETECTED Final   Respiratory Syncytial Virus NOT DETECTED NOT DETECTED Final   Bordetella pertussis NOT DETECTED NOT DETECTED Final   Chlamydophila pneumoniae NOT DETECTED NOT DETECTED Final   Mycoplasma pneumoniae NOT DETECTED NOT DETECTED Final  MRSA PCR Screening     Status: None   Collection Time: 09/20/15  5:15 AM  Result Value Ref Range Status   MRSA by PCR  NEGATIVE NEGATIVE Final    Comment:        The GeneXpert MRSA Assay (FDA approved for NASAL specimens only), is one component of a comprehensive MRSA colonization surveillance program. It is not intended to diagnose MRSA infection nor to guide or monitor treatment for MRSA infections.   Culture, sputum-assessment     Status: None   Collection Time: 09/20/15  8:41 AM  Result Value Ref Range Status   Specimen  Description SPUTUM  Final   Special Requests Normal  Final   Sputum evaluation   Final    THIS SPECIMEN IS ACCEPTABLE. RESPIRATORY CULTURE REPORT TO FOLLOW.   Report Status 09/20/2015 FINAL  Final  Culture, respiratory (NON-Expectorated)     Status: None (Preliminary result)   Collection Time: 09/20/15  8:41 AM  Result Value Ref Range Status   Specimen Description SPUTUM  Final   Special Requests NONE  Final   Gram Stain   Final    ABUNDANT SQUAMOUS EPITHELIAL CELLS PRESENT MODERATE WBC PRESENT,BOTH PMN AND MONONUCLEAR MODERATE GRAM POSITIVE COCCI IN PAIRS MODERATE GRAM VARIABLE ROD FEW YEAST    Culture PENDING  Incomplete   Report Status PENDING  Incomplete      Radiology Studies: Dg Chest 2 View  Result Date: 09/19/2015 CLINICAL DATA:  Cough and shortness of Breath EXAM: CHEST  2 VIEW COMPARISON:  01/13/2011 FINDINGS: Cardiac shadow is within normal limits. The lungs are well aerated bilaterally. The left lung is clear. Patchy infiltrative changes are noted throughout the right lung. No bony abnormality is noted. IMPRESSION: Patchy infiltrates throughout the right lung. Followup PA and lateral chest X-ray is recommended in 3-4 weeks following trial of antibiotic therapy to ensure resolution and exclude underlying malignancy. Electronically Signed   By: Alcide Clever M.D.   On: 09/19/2015 16:18      Scheduled Meds: . cefTRIAXone (ROCEPHIN)  IV  1 g Intravenous Q24H  . cycloSPORINE  1 drop Both Eyes BID  . enoxaparin (LOVENOX) injection  40 mg Subcutaneous QHS  .  feeding supplement (ENSURE ENLIVE)  237 mL Oral BID BM  . furosemide  40 mg Oral BID  . gabapentin  300 mg Oral QID  . ipratropium-albuterol  3 mL Nebulization TID  . lamoTRIgine  25 mg Oral BID  . lithium carbonate  300 mg Oral TID WC  . metoprolol succinate  100 mg Oral Daily  . mometasone-formoterol  2 puff Inhalation BID  . pantoprazole  40 mg Oral BID  . PARoxetine  20 mg Oral Daily  . risperiDONE  4 mg Oral BID  . simvastatin  20 mg Oral QHS   Continuous Infusions:    LOS: 2 days    Time spent: 20 minutes    Debbora Presto, MD [facility] Pager [phone]  If 7PM-7AM, please contact night-coverage [email or web address] Password [email or web address] 09/21/2015, 8:01 AM

## 2015-09-22 DIAGNOSIS — J189 Pneumonia, unspecified organism: Principal | ICD-10-CM

## 2015-09-22 LAB — CULTURE, RESPIRATORY

## 2015-09-22 LAB — BASIC METABOLIC PANEL
ANION GAP: 9 (ref 5–15)
BUN: 8 mg/dL (ref 6–20)
CHLORIDE: 99 mmol/L — AB (ref 101–111)
CO2: 31 mmol/L (ref 22–32)
Calcium: 9.2 mg/dL (ref 8.9–10.3)
Creatinine, Ser: 0.65 mg/dL (ref 0.44–1.00)
GFR calc Af Amer: >60 mL/min (ref >60)
Glucose, Bld: 150 mg/dL — ABNORMAL HIGH (ref 65–99)
POTASSIUM: 3.8 mmol/L (ref 3.5–5.1)
SODIUM: 139 mmol/L (ref 135–145)

## 2015-09-22 LAB — CBC
HCT: 37.4 % (ref 36.0–46.0)
HEMOGLOBIN: 11.8 g/dL — AB (ref 12.0–15.0)
MCH: 26.3 pg (ref 26.0–34.0)
MCHC: 31.6 g/dL (ref 30.0–36.0)
MCV: 83.5 fL (ref 78.0–100.0)
Platelets: 265 10*3/uL (ref 150–400)
RBC: 4.48 MIL/uL (ref 3.87–5.11)
RDW: 16.3 % — ABNORMAL HIGH (ref 11.5–15.5)
WBC: 7.9 10*3/uL (ref 4.0–10.5)

## 2015-09-22 LAB — CULTURE, RESPIRATORY W GRAM STAIN: Culture: NORMAL

## 2015-09-22 LAB — MAGNESIUM: MAGNESIUM: 2 mg/dL (ref 1.7–2.4)

## 2015-09-22 LAB — PHOSPHORUS: Phosphorus: 3.8 mg/dL (ref 2.5–4.6)

## 2015-09-22 MED ORDER — HYDROCODONE-ACETAMINOPHEN 5-325 MG PO TABS
1.0000 | ORAL_TABLET | Freq: Four times a day (QID) | ORAL | Status: DC | PRN
  Administered 2015-09-22 (×2): 1 via ORAL
  Filled 2015-09-22 (×2): qty 1

## 2015-09-22 MED ORDER — HYDROXYZINE HCL 25 MG PO TABS
25.0000 mg | ORAL_TABLET | Freq: Four times a day (QID) | ORAL | Status: DC | PRN
  Administered 2015-09-22: 25 mg via ORAL
  Filled 2015-09-22: qty 1

## 2015-09-22 NOTE — Progress Notes (Signed)
Pt ambulated 200 ft and O2 sats maintained from 97-100%. Pt did not have any DOE. Pt tolerated ambulation well.

## 2015-09-22 NOTE — Progress Notes (Signed)
Patient ID: Kathleen Herrera, female   DOB: 09/29/1960, 55 y.o.   MRN: [id number]    PROGRESS NOTE    Kathleen Herrera  WUJ:[id number] DOB: Jun 19, 1960 DOA: 09/19/2015  PCP: Concepcion Living, NP   Outpatient Specialists:   Brief Narrative:  55 y.o. female with medical history significant of HTN, HL fibromyalgia, Bipolar depression, Presented with shortness of breath and cough for past 5 days associated with generalized aches she had some sore throat. Not sure of any sick contacts. Reports chills. No significant weight loss.  Assessment & Plan:   RLL PNA, CAP, unknown pathogen - overall better but noted hypoxia after 150 feet of ambulation - continue zithromax and rocephin day #2 - follow up on cultures sputum, strep pneumo and urine legionella - narrow ABX in am and plan to d/c in AM - We'll plan to him blood today and recheck oxygen saturation  Essential HTN - continue home medical regimen  Bipolar depression  - stable - continue home medical regimen   DVT prophylaxis: SCD Code Status: DNR Family Communication: Patient at bedside  Disposition Plan: Home in am  Consultants:   None   Procedures:   None   Antimicrobials:   Zithro and Rocephin 8/15 -->  Subjective: Still with exertional dyspnea.   Objective: Vitals:   09/21/15 1800 09/21/15 1937 09/21/15 2220 09/22/15 0504  BP: 136/72  124/71 122/67  Pulse: 71  71 70  Resp: 18  20 18   Temp: 98.7 F (37.1 C)  98.8 F (37.1 C) 98.6 F (37 C)  TempSrc: Oral  Oral Oral  SpO2: 100% 99% 97% 99%  Weight:      Height:        Intake/Output Summary (Last 24 hours) at 09/22/15 9562 Last data filed at 09/22/15 0505  Gross per 24 hour  Intake              740 ml  Output             5700 ml  Net            -4960 ml   Filed Weights   09/19/15 1651 09/19/15 2257  Weight: 122.5 kg (270 lb) 56.4 kg (124 lb 4.8 oz)    Examination:  General exam: Appears calm and comfortable  Respiratory system: Respiratory  effort normal. Some rhonchi at the right lower lobe area  Cardiovascular system: S1 & S2 heard, RRR. No JVD, murmurs, rubs, gallops or clicks. No pedal edema. Gastrointestinal system: Abdomen is nondistended, soft and nontender. No organomegaly or masses felt.  Central nervous system: Alert and oriented. No focal neurological deficits. Extremities: Symmetric 5 x 5 power. Skin: No rashes, lesions or ulcers Psychiatry: Judgement and insight appear normal. Mood & affect appropriate.    Data Reviewed: I have personally reviewed following labs and imaging studies  CBC:  Recent Labs Lab 09/19/15 1834 09/20/15 0429 09/22/15 0541  WBC 9.7 8.8 7.9  NEUTROABS 7.3 5.9  --   HGB 12.0 10.9* 11.8*  HCT 38.5 34.8* 37.4  MCV 83.9 84.7 83.5  PLT 226 220 265   Basic Metabolic Panel:  Recent Labs Lab 09/19/15 1834 09/20/15 0429 09/21/15 0600 09/22/15 0541  NA 140 139 138 139  K 4.0 3.8 4.0 3.8  CL 105 104 100* 99*  CO2 28 28 29 31   GLUCOSE 106* 125* 155* 150*  BUN 7 6 7 8   CREATININE 0.64 0.78 0.68 0.65  CALCIUM 9.4 8.9 8.7* 9.2  MG  --   --   --  2.0  PHOS  --   --   --  3.8   GFR: Estimated Creatinine Clearance: 66.4 mL/min (by C-G formula based on SCr of 0.8 mg/dL). Liver Function Tests:  Recent Labs Lab 09/20/15 0429  AST 16  ALT 16  ALKPHOS 68  BILITOT 0.8  PROT 7.1  ALBUMIN 3.8   Cardiac Enzymes:  Recent Labs Lab 09/19/15 2116 09/20/15 0429 09/20/15 1056  TROPONINI <0.03 <0.03 <0.03   Thyroid Function Tests:  Recent Labs  09/19/15 2216  TSH 4.004   Urine analysis:    Component Value Date/Time   COLORURINE YELLOW 09/19/2015 1818   APPEARANCEUR CLEAR 09/19/2015 1818   LABSPEC 1.006 09/19/2015 1818   PHURINE 7.0 09/19/2015 1818   GLUCOSEU NEGATIVE 09/19/2015 1818   HGBUR NEGATIVE 09/19/2015 1818   BILIRUBINUR NEGATIVE 09/19/2015 1818   KETONESUR NEGATIVE 09/19/2015 1818   PROTEINUR NEGATIVE 09/19/2015 1818   UROBILINOGEN 1.0 01/06/2014 1550    NITRITE NEGATIVE 09/19/2015 1818   LEUKOCYTESUR NEGATIVE 09/19/2015 1818   Recent Results (from the past 240 hour(s))  Urine culture     Status: None   Collection Time: 09/19/15  6:18 PM  Result Value Ref Range Status   Specimen Description URINE, RANDOM  Final   Special Requests NONE  Final   Culture NO GROWTH Performed at [facility]   Final   Report Status 09/21/2015 FINAL  Final  Culture, blood (routine x 2)     Status: None (Preliminary result)   Collection Time: 09/19/15  6:35 PM  Result Value Ref Range Status   Specimen Description BLOOD LEFT ARM  Final   Special Requests BOTTLES DRAWN AEROBIC AND ANAEROBIC 5CC  Final   Culture   Final    NO GROWTH 2 DAYS Performed at [facility]    Report Status PENDING  Incomplete  Culture, blood (routine x 2)     Status: None (Preliminary result)   Collection Time: 09/19/15  6:45 PM  Result Value Ref Range Status   Specimen Description BLOOD RIGHT ARM  Final   Special Requests BOTTLES DRAWN AEROBIC AND ANAEROBIC 5CC  Final   Culture   Final    NO GROWTH 2 DAYS Performed at [facility]    Report Status PENDING  Incomplete  Respiratory Panel by PCR     Status: None   Collection Time: 09/20/15  1:15 AM  Result Value Ref Range Status   Adenovirus NOT DETECTED NOT DETECTED Final   Coronavirus 229E NOT DETECTED NOT DETECTED Final   Coronavirus HKU1 NOT DETECTED NOT DETECTED Final   Coronavirus NL63 NOT DETECTED NOT DETECTED Final   Coronavirus OC43 NOT DETECTED NOT DETECTED Final   Metapneumovirus NOT DETECTED NOT DETECTED Final   Rhinovirus / Enterovirus NOT DETECTED NOT DETECTED Final   Influenza A NOT DETECTED NOT DETECTED Final   Influenza A H1 NOT DETECTED NOT DETECTED Final   Influenza A H1 2009 NOT DETECTED NOT DETECTED Final   Influenza A H3 NOT DETECTED NOT DETECTED Final   Influenza B NOT DETECTED NOT DETECTED Final   Parainfluenza Virus 1 NOT DETECTED NOT DETECTED Final   Parainfluenza Virus 2  NOT DETECTED NOT DETECTED Final   Parainfluenza Virus 3 NOT DETECTED NOT DETECTED Final   Parainfluenza Virus 4 NOT DETECTED NOT DETECTED Final   Respiratory Syncytial Virus NOT DETECTED NOT DETECTED Final   Bordetella pertussis NOT DETECTED NOT DETECTED Final   Chlamydophila pneumoniae NOT DETECTED NOT DETECTED Final   Mycoplasma pneumoniae  NOT DETECTED NOT DETECTED Final  MRSA PCR Screening     Status: None   Collection Time: 09/20/15  5:15 AM  Result Value Ref Range Status   MRSA by PCR NEGATIVE NEGATIVE Final    Comment:        The GeneXpert MRSA Assay (FDA approved for NASAL specimens only), is one component of a comprehensive MRSA colonization surveillance program. It is not intended to diagnose MRSA infection nor to guide or monitor treatment for MRSA infections.   Culture, sputum-assessment     Status: None   Collection Time: 09/20/15  8:41 AM  Result Value Ref Range Status   Specimen Description SPUTUM  Final   Special Requests Normal  Final   Sputum evaluation   Final    THIS SPECIMEN IS ACCEPTABLE. RESPIRATORY CULTURE REPORT TO FOLLOW.   Report Status 09/20/2015 FINAL  Final  Culture, respiratory (NON-Expectorated)     Status: None (Preliminary result)   Collection Time: 09/20/15  8:41 AM  Result Value Ref Range Status   Specimen Description SPUTUM  Final   Special Requests NONE  Final   Gram Stain   Final    ABUNDANT SQUAMOUS EPITHELIAL CELLS PRESENT MODERATE WBC PRESENT,BOTH PMN AND MONONUCLEAR MODERATE GRAM POSITIVE COCCI IN PAIRS MODERATE GRAM VARIABLE ROD FEW YEAST    Culture   Final    CULTURE REINCUBATED FOR BETTER GROWTH Performed at [facility]    Report Status PENDING  Incomplete      Radiology Studies: No results found.    Scheduled Meds: . amLODipine  10 mg Oral Daily  . azithromycin  500 mg Intravenous Q24H  . cefTRIAXone (ROCEPHIN)  IV  1 g Intravenous Q24H  . cycloSPORINE  1 drop Both Eyes BID  . enoxaparin (LOVENOX)  injection  40 mg Subcutaneous QHS  . feeding supplement (ENSURE ENLIVE)  237 mL Oral BID BM  . furosemide  40 mg Oral BID  . gabapentin  300 mg Oral QID  . ipratropium-albuterol  3 mL Nebulization TID  . lamoTRIgine  25 mg Oral BID  . lithium carbonate  300 mg Oral TID WC  . metoprolol succinate  100 mg Oral Daily  . mometasone-formoterol  2 puff Inhalation BID  . pantoprazole  40 mg Oral BID  . PARoxetine  20 mg Oral Daily  . risperiDONE  4 mg Oral BID  . simvastatin  20 mg Oral QHS   Continuous Infusions:    LOS: 3 days    Time spent: 20 minutes    Debbora PrestoMAGICK-Jahquez Steffler, MD [facility] Pager [phone]  If 7PM-7AM, please contact night-coverage [email or web address] Password [email or web address] 09/22/2015, 7:12 AM

## 2015-09-23 DIAGNOSIS — J45909 Unspecified asthma, uncomplicated: Secondary | ICD-10-CM

## 2015-09-23 MED ORDER — HYDROCODONE-ACETAMINOPHEN 5-325 MG PO TABS: 1.0000 | ORAL_TABLET | Freq: Four times a day (QID) | ORAL | 0 refills | Status: DC | PRN

## 2015-09-23 MED ORDER — ALBUTEROL SULFATE HFA 108 (90 BASE) MCG/ACT IN AERS: 2.0000 | INHALATION_SPRAY | Freq: Four times a day (QID) | RESPIRATORY_TRACT | 3 refills | Status: DC | PRN

## 2015-09-23 MED ORDER — IPRATROPIUM-ALBUTEROL 0.5-2.5 (3) MG/3ML IN SOLN: 3.0000 mL | RESPIRATORY_TRACT | Status: DC | PRN

## 2015-09-23 MED ORDER — DOXYCYCLINE HYCLATE 100 MG PO CAPS: 100.0000 mg | ORAL_CAPSULE | Freq: Two times a day (BID) | ORAL | 0 refills | Status: DC

## 2015-09-23 MED ORDER — FLUTICASONE-SALMETEROL 500-50 MCG/DOSE IN AEPB: 1.0000 | INHALATION_SPRAY | Freq: Two times a day (BID) | RESPIRATORY_TRACT | 1 refills | Status: DC

## 2015-09-23 MED ORDER — MOMETASONE FURO-FORMOTEROL FUM 200-5 MCG/ACT IN AERO: 2.0000 | INHALATION_SPRAY | Freq: Two times a day (BID) | RESPIRATORY_TRACT | 3 refills | Status: DC

## 2015-09-23 MED ORDER — IPRATROPIUM-ALBUTEROL 0.5-2.5 (3) MG/3ML IN SOLN: 3.0000 mL | RESPIRATORY_TRACT | 1 refills | Status: DC | PRN

## 2015-09-23 NOTE — Care Management Note (Signed)
Case Management Note  Patient Details  Name: Kathleen Herrera MRN: [id number] Date of Birth: 1960-08-14  Subjective/Objective:Received order for neb machine-[facility] dme rep Kathleen Herrera aware of d/c & order-neb machine brought to rm.Provided patient w/community resources for meals on wheels/Dept SS tel# & informed patient f/u w/medicaid case worker for meals/ also TC to [employer] access rep Nordstromywan Mckenzie left vm w/patient info for resources for meals. Patient voiced understanding.                    Action/Plan:d/c home no further needs or orders.   Expected Discharge Date:   (unknown)               Expected Discharge Plan:  Home/Self Care  In-House Referral:     Discharge planning Services  CM Consult  Post Acute Care Choice:    Choice offered to:     DME Arranged:  Nebulizer machine DME Agency:  [facility]  [facility]     Status of Service:     If discussed at Long Length of Stay Meetings, dates discussed:    Additional Comments:  Lanier ClamMahabir, Kathleen Route, RN 09/23/2015, 12:26 PM

## 2015-09-23 NOTE — Discharge Summary (Signed)
Physician Discharge Summary  Mauri PoleChappelle [name] ZOX:[id number] DOB: September 01, 1960 DOA: 09/19/2015  PCP: Concepcion LivingBERNHARDT, LINDA, NP  Admit date: 09/19/2015 Discharge date: 09/23/2015  Recommendations for Outpatient Follow-up:  1. Pt will need to follow up with PCP in 2-3 weeks post discharge 2. Please obtain BMP to evaluate electrolytes and kidney function  Discharge Diagnoses:  Active Problems:   Essential hypertension   Hyperlipidemia   Bipolar 2 disorder (HCC)   CAP (community acquired pneumonia)   Dyspnea   Acute respiratory failure with hypoxia (HCC)    Discharge Condition: Stable  Diet recommendation: Heart healthy diet discussed in details   Brief Narrative:  55 y.o.femalewith medical history significant of HTN, HL fibromyalgia, Bipolar depression, Presented with shortness of breath and cough for past 5 days associated withgeneralized achesshe had some sore throat. Not sure of any sick contacts. Reports chills. No significant weight loss.  Assessment & Plan:   RLL PNA, CAP, unknown pathogen - overall better but noted hypoxia after 150 feet of ambulation - continued zithromax and rocephin day #3, changed to doxy upon discharge to complete therapy   Essential HTN - continue home medical regimen  Bipolar depression  - stable - continue home medical regimen   DVT prophylaxis: SCD Code Status: DNR Family Communication: Patient at bedside  Disposition Plan: Home   Consultants:   None   Procedures:   None   Antimicrobials:   Zithro and Rocephin 8/15 --> changed to doxy on discharge to complete therapy    Discharge Exam: Vitals:   09/22/15 2246 09/23/15 0511  BP: 136/70 (!) 116/57  Pulse: 72 68  Resp: 20 20  Temp: 99 F (37.2 C) 98.2 F (36.8 C)   Vitals:   09/22/15 2218 09/22/15 2246 09/23/15 0511 09/23/15 0809  BP:  136/70 (!) 116/57   Pulse:  72 68   Resp:  20 20   Temp:  99 F (37.2 C) 98.2 F (36.8 C)   TempSrc:  Oral Oral   SpO2:  95% 98% 94% 95%  Weight:      Height:        General: Pt is alert, follows commands appropriately, not in acute distress Cardiovascular: Regular rate and rhythm, S1/S2 +, no murmurs, no rubs, no gallops Respiratory: Clear to auscultation bilaterally, no wheezing, no crackles, no rhonchi Abdominal: Soft, non tender, non distended, bowel sounds +, no guarding Extremities: no edema, no cyanosis, pulses palpable bilaterally DP and PT Neuro: Grossly nonfocal  Discharge Instructions  Discharge Instructions    Diet - low sodium heart healthy    Complete by:  As directed   Increase activity slowly    Complete by:  As directed       Medication List    TAKE these medications   albuterol 108 (90 Base) MCG/ACT inhaler Commonly known as:  PROVENTIL HFA;VENTOLIN HFA Inhale 2 puffs into the lungs every 6 (six) hours as needed for wheezing or shortness of breath.   amLODipine 10 MG tablet Commonly known as:  NORVASC Take 10 mg by mouth daily.   celecoxib 100 MG capsule Commonly known as:  CELEBREX Take 100 mg by mouth 2 (two) times daily.   cycloSPORINE 0.05 % ophthalmic emulsion Commonly known as:  RESTASIS Place 1 drop into both eyes 2 (two) times daily.   doxycycline 100 MG capsule Commonly known as:  VIBRAMYCIN Take 1 capsule (100 mg total) by mouth 2 (two) times daily.   Fluticasone-Salmeterol 500-50 MCG/DOSE Aepb Commonly known as:  ADVAIR DISKUS  Inhale 1 puff into the lungs 2 (two) times daily.   furosemide 20 MG tablet Commonly known as:  LASIX Take 1 tablet (20 mg total) by mouth daily.   gabapentin 300 MG capsule Commonly known as:  NEURONTIN Take 300 mg by mouth 4 (four) times daily.   HYDROcodone-acetaminophen 5-325 MG tablet Commonly known as:  NORCO/VICODIN Take 1 tablet by mouth every 6 (six) hours as needed for moderate pain.   hydrOXYzine 25 MG capsule Commonly known as:  VISTARIL Take 1 capsule (25 mg total) by mouth every 6 (six) hours as needed for  anxiety.   ipratropium-albuterol 0.5-2.5 (3) MG/3ML Soln Commonly known as:  DUONEB Take 3 mLs by nebulization every 4 (four) hours as needed.   lamoTRIgine 25 MG tablet Commonly known as:  LAMICTAL Take 25 mg by mouth 2 (two) times daily.   lithium carbonate 300 MG capsule Take 300 mg by mouth 3 (three) times daily with meals.   metoprolol succinate 50 MG 24 hr tablet Commonly known as:  TOPROL-XL Take 2 tablets (100 mg total) by mouth daily. Take with or immediately following a meal.   pantoprazole 40 MG tablet Commonly known as:  PROTONIX Take 1 tablet (40 mg total) by mouth 2 (two) times daily.   PARoxetine 20 MG tablet Commonly known as:  PAXIL Take 20 mg by mouth daily.   risperidone 4 MG tablet Commonly known as:  RISPERDAL Take 4 mg by mouth 2 (two) times daily.   simvastatin 20 MG tablet Commonly known as:  ZOCOR Take 1 tablet (20 mg total) by mouth at bedtime.      Follow-up Information    Concepcion LivingBERNHARDT, LINDA, NP .   Specialty:  Family Medicine Contact information: [address] [place] [address] [phone]        Debbora PrestoMAGICK-Clarisa Danser, MD .   Specialty:  Internal Medicine Contact information: [address] [place] [address] [phone]            The results of significant diagnostics from this hospitalization (including imaging, microbiology, ancillary and laboratory) are listed below for reference.     Microbiology: Recent Results (from the past 240 hour(s))  Urine culture     Status: None   Collection Time: 09/19/15  6:18 PM  Result Value Ref Range Status   Specimen Description URINE, RANDOM  Final   Special Requests NONE  Final   Culture NO GROWTH [facility]   Final   Report Status 09/21/2015 FINAL  Final  Culture, blood (routine x 2)     Status: None (Preliminary result)   Collection Time: 09/19/15  6:35 PM  Result Value Ref Range Status   Specimen Description BLOOD LEFT ARM   Final   Special Requests BOTTLES DRAWN AEROBIC AND ANAEROBIC 5CC  Final   Culture   Final    NO GROWTH 3 DAYS [facility]formed at [facility]    Report Status PENDING  Incomplete  Culture, blood (routine x 2)     Status: None (Preliminary result)   Collection Time: 09/19/15  6:45 PM  Result Value Ref Range Status   Specimen Description BLOOD RIGHT ARM  Final   Special Requests BOTTLES DRAWN AEROBIC AND ANAEROBIC 5CC  Final   Culture   Final    NO GROWTH 3 DAYS Performed at [facility]    Report Status PENDING  Incomplete  Respiratory Panel by PCR     Status: None   Collection  Time: 09/20/15  1:15 AM  Result Value Ref Range Status   Adenovirus NOT DETECTED NOT DETECTED Final   Coronavirus 229E NOT DETECTED NOT DETECTED Final   Coronavirus HKU1 NOT DETECTED NOT DETECTED Final   Coronavirus NL63 NOT DETECTED NOT DETECTED Final   Coronavirus OC43 NOT DETECTED NOT DETECTED Final   Metapneumovirus NOT DETECTED NOT DETECTED Final   Rhinovirus / Enterovirus NOT DETECTED NOT DETECTED Final   Influenza A NOT DETECTED NOT DETECTED Final   Influenza A H1 NOT DETECTED NOT DETECTED Final   Influenza A H1 2009 NOT DETECTED NOT DETECTED Final   Influenza A H3 NOT DETECTED NOT DETECTED Final   Influenza B NOT DETECTED NOT DETECTED Final   Parainfluenza Virus 1 NOT DETECTED NOT DETECTED Final   Parainfluenza Virus 2 NOT DETECTED NOT DETECTED Final   Parainfluenza Virus 3 NOT DETECTED NOT DETECTED Final   Parainfluenza Virus 4 NOT DETECTED NOT DETECTED Final   Respiratory Syncytial Virus NOT DETECTED NOT DETECTED Final   Bordetella pertussis NOT DETECTED NOT DETECTED Final   Chlamydophila pneumoniae NOT DETECTED NOT DETECTED Final   Mycoplasma pneumoniae NOT DETECTED NOT DETECTED Final  MRSA PCR Screening     Status: None   Collection Time: 09/20/15  5:15 AM  Result Value Ref Range Status   MRSA by PCR NEGATIVE NEGATIVE Final    Comment:        The GeneXpert MRSA Assay  (FDA approved for NASAL specimens only), is one component of a comprehensive MRSA colonization surveillance program. It is not intended to diagnose MRSA infection nor to guide or monitor treatment for MRSA infections.   Culture, sputum-assessment     Status: None   Collection Time: 09/20/15  8:41 AM  Result Value Ref Range Status   Specimen Description SPUTUM  Final   Special Requests Normal  Final   Sputum evaluation   Final    THIS SPECIMEN IS ACCEPTABLE. RESPIRATORY CULTURE REPORT TO FOLLOW.   Report Status 09/20/2015 FINAL  Final  Culture, respiratory (NON-Expectorated)     Status: None   Collection Time: 09/20/15  8:41 AM  Result Value Ref Range Status   Specimen Description SPUTUM  Final   Special Requests NONE  Final   Gram Stain   Final    ABUNDANT SQUAMOUS EPITHELIAL CELLS PRESENT MODERATE WBC PRESENT,BOTH PMN AND MONONUCLEAR MODERATE GRAM POSITIVE COCCI IN PAIRS MODERATE GRAM VARIABLE ROD FEW YEAST    Culture   Final    Consistent with normal respiratory flora. Performed at [facility]    Report Status 09/22/2015 FINAL  Final     Labs: Basic Metabolic Panel:  Recent Labs Lab 09/19/15 1834 09/20/15 0429 09/21/15 0600 09/22/15 0541  NA 140 139 138 139  K 4.0 3.8 4.0 3.8  CL 105 104 100* 99*  CO2 28 28 29 31   GLUCOSE 106* 125* 155* 150*  BUN 7 6 7 8   CREATININE 0.64 0.78 0.68 0.65  CALCIUM 9.4 8.9 8.7* 9.2  MG  --   --   --  2.0  PHOS  --   --   --  3.8   Liver Function Tests:  Recent Labs Lab 09/20/15 0429  AST 16  ALT 16  ALKPHOS 68  BILITOT 0.8  PROT 7.1  ALBUMIN 3.8   CBC:  Recent Labs Lab 09/19/15 1834 09/20/15 0429 09/22/15 0541  WBC 9.7 8.8 7.9  NEUTROABS 7.3 5.9  --   HGB 12.0 10.9* 11.8*  HCT 38.5 34.8*  37.4  MCV 83.9 84.7 83.5  PLT 226 220 265   Cardiac Enzymes:  Recent Labs Lab 09/19/15 2116 09/20/15 0429 09/20/15 1056  TROPONINI <0.03 <0.03 <0.03   BNP: BNP (last 3 results)  Recent Labs   09/19/15 2216  BNP 195.6*     SIGNED: Time coordinating discharge:  30 minutes  Debbora Presto, MD  [facility] 09/23/2015, 11:34 AM Pager [phone]  If 7PM-7AM, please contact night-coverage [email or web address] Password [email or web address]

## 2015-09-23 NOTE — Discharge Instructions (Signed)

## 2015-09-24 LAB — CULTURE, BLOOD (ROUTINE X 2)
Culture: NO GROWTH
Culture: NO GROWTH

## 2015-09-29 ENCOUNTER — Ambulatory Visit: Payer: Medicaid Other | Admitting: Family Medicine

## 2015-10-05 ENCOUNTER — Other Ambulatory Visit: Payer: Self-pay

## 2015-10-05 MED ORDER — CELECOXIB 100 MG PO CAPS: 100.0000 mg | ORAL_CAPSULE | Freq: Two times a day (BID) | ORAL | 2 refills | Status: DC

## 2015-10-07 LAB — LEGIONELLA PNEUMOPHILA SEROGP 1 UR AG: L. pneumophila Serogp 1 Ur Ag: NEGATIVE

## 2015-10-12 ENCOUNTER — Other Ambulatory Visit: Payer: Self-pay | Admitting: Family Medicine

## 2015-10-12 DIAGNOSIS — G4700 Insomnia, unspecified: Secondary | ICD-10-CM

## 2015-10-14 ENCOUNTER — Telehealth: Payer: Self-pay

## 2015-10-14 ENCOUNTER — Other Ambulatory Visit: Payer: Self-pay | Admitting: Family Medicine

## 2015-10-14 DIAGNOSIS — G4700 Insomnia, unspecified: Secondary | ICD-10-CM

## 2015-10-14 MED ORDER — HYDROXYZINE PAMOATE 25 MG PO CAPS: 25.0000 mg | ORAL_CAPSULE | Freq: Four times a day (QID) | ORAL | 0 refills | Status: DC | PRN

## 2015-10-14 NOTE — Telephone Encounter (Signed)
Bonita QuinLinda Please advise if this can be refilled for patient. Thanks!

## 2015-10-27 ENCOUNTER — Ambulatory Visit: Payer: Medicaid Other | Admitting: Family Medicine

## 2015-11-18 ENCOUNTER — Ambulatory Visit (INDEPENDENT_AMBULATORY_CARE_PROVIDER_SITE_OTHER): Payer: Medicaid Other | Admitting: Family Medicine

## 2015-11-18 ENCOUNTER — Encounter: Payer: Self-pay | Admitting: Internal Medicine

## 2015-11-18 VITALS — BP 134/71 | HR 59 | Temp 98.2°F | Resp 16 | Ht 64.0 in | Wt 261.0 lb

## 2015-11-18 DIAGNOSIS — Z1231 Encounter for screening mammogram for malignant neoplasm of breast: Secondary | ICD-10-CM

## 2015-11-18 DIAGNOSIS — R600 Localized edema: Secondary | ICD-10-CM

## 2015-11-18 DIAGNOSIS — Z23 Encounter for immunization: Secondary | ICD-10-CM

## 2015-11-18 DIAGNOSIS — R000 Tachycardia, unspecified: Secondary | ICD-10-CM

## 2015-11-18 DIAGNOSIS — Z1211 Encounter for screening for malignant neoplasm of colon: Secondary | ICD-10-CM | POA: Diagnosis not present

## 2015-11-18 DIAGNOSIS — I10 Essential (primary) hypertension: Secondary | ICD-10-CM | POA: Diagnosis not present

## 2015-11-18 DIAGNOSIS — Z1239 Encounter for other screening for malignant neoplasm of breast: Secondary | ICD-10-CM

## 2015-11-18 LAB — LIPID PANEL
CHOL/HDL RATIO: 3.9 ratio (ref <=5.0)
Cholesterol: 256 mg/dL — ABNORMAL HIGH (ref 125–200)
HDL: 65 mg/dL (ref >=46)
LDL Cholesterol: 138 mg/dL — ABNORMAL HIGH (ref <130)
TRIGLYCERIDES: 265 mg/dL — AB (ref <150)
VLDL: 53 mg/dL — ABNORMAL HIGH (ref <30)

## 2015-11-18 LAB — CBC WITH DIFFERENTIAL/PLATELET
BASOS PCT: 0 %
Basophils Absolute: 0 cells/uL (ref 0–200)
Eosinophils Absolute: 116 cells/uL (ref 15–500)
Eosinophils Relative: 2 %
HCT: 43.3 % (ref 35.0–45.0)
Hemoglobin: 13.6 g/dL (ref 11.7–15.5)
LYMPHS PCT: 33 %
Lymphs Abs: 1914 cells/uL (ref 850–3900)
MCH: 25.7 pg — ABNORMAL LOW (ref 27.0–33.0)
MCHC: 31.4 g/dL — AB (ref 32.0–36.0)
MCV: 81.9 fL (ref 80.0–100.0)
MONOS PCT: 8 %
MPV: 10.2 fL (ref 7.5–12.5)
Monocytes Absolute: 464 cells/uL (ref 200–950)
Neutro Abs: 3306 cells/uL (ref 1500–7800)
Neutrophils Relative %: 57 %
PLATELETS: 281 10*3/uL (ref 140–400)
RBC: 5.29 MIL/uL — AB (ref 3.80–5.10)
RDW: 16.3 % — AB (ref 11.0–15.0)
WBC: 5.8 10*3/uL (ref 3.8–10.8)

## 2015-11-18 LAB — COMPLETE METABOLIC PANEL WITH GFR
ALT: 17 U/L (ref 6–29)
AST: 22 U/L (ref 10–35)
Albumin: 4 g/dL (ref 3.6–5.1)
Alkaline Phosphatase: 66 U/L (ref 33–130)
BUN: 8 mg/dL (ref 7–25)
CHLORIDE: 102 mmol/L (ref 98–110)
CO2: 30 mmol/L (ref 20–31)
CREATININE: 0.92 mg/dL (ref 0.50–1.05)
Calcium: 9.4 mg/dL (ref 8.6–10.4)
GFR, EST AFRICAN AMERICAN: 80 mL/min (ref >=60)
GFR, Est Non African American: 70 mL/min (ref >=60)
Glucose, Bld: 77 mg/dL (ref 65–99)
POTASSIUM: 4.3 mmol/L (ref 3.5–5.3)
Sodium: 138 mmol/L (ref 135–146)
Total Bilirubin: 0.4 mg/dL (ref 0.2–1.2)
Total Protein: 6.9 g/dL (ref 6.1–8.1)

## 2015-11-18 LAB — HEMOGLOBIN A1C
Hgb A1c MFr Bld: 6 % — ABNORMAL HIGH (ref <5.7)
MEAN PLASMA GLUCOSE: 126 mg/dL

## 2015-11-18 MED ORDER — AMLODIPINE BESYLATE 10 MG PO TABS: 10.0000 mg | ORAL_TABLET | Freq: Every day | ORAL | 1 refills | Status: DC

## 2015-11-18 MED ORDER — FUROSEMIDE 20 MG PO TABS: 20.0000 mg | ORAL_TABLET | Freq: Every day | ORAL | 3 refills | Status: DC

## 2015-11-18 MED ORDER — METOPROLOL SUCCINATE ER 50 MG PO TB24: 100.0000 mg | ORAL_TABLET | Freq: Every day | ORAL | 2 refills | Status: DC

## 2015-11-18 MED ORDER — PNEUMOCOCCAL VAC POLYVALENT 25 MCG/0.5ML IJ INJ
0.5000 mL | INJECTION | INTRAMUSCULAR | Status: AC
  Administered 2015-11-18: 0.5 mL via INTRAMUSCULAR

## 2015-11-18 NOTE — Patient Instructions (Signed)
Follow-up in 6 months. Call Medicaid and find out which mental health facilities will take your insurance.

## 2015-11-18 NOTE — Progress Notes (Signed)
Kathleen Herrera, is a 55 y.o. female  ZOX:[id number]  WJX:[id number]  DOB - 01-18-1961  CC:  Chief Complaint  Patient presents with  . Hypertension  . Hospitalization Follow-up       HPI: Kathleen Herrera is a 55 y.o. female here for follow-up chronic conditions.She reports no significant change since her last visit. She still has not found a mental health professional to follow her Bipolar Disorder. She says the wait is too long at [facility].   1. Hypertension: She is on amoldipine , lasix and toprol.XL. Dosages are listed in her medication list. Her BP today is 134/71. She does need refills on these medications today.   2. Anxiety: She request a refill of Vistaril.   3. She was in hospital in August for CAP and was instructed to follow up here for Cmet and CBC,  She reports not significant symptoms since discharge.   4. Health Maintenance: Will give pneumonia 23 today. Will order mammogram and colonoscopy.    No Known Allergies Past Medical History:  Diagnosis Date  . Arthritis   . Bronchitis   . Depression   . Fibromyalgia Y-2  . Fibromyalgia unknown  . Hyperlipemia   . Hypertension   . Post traumatic stress disorder   . Sinus congestion    Current Outpatient Prescriptions on File Prior to Visit  Medication Sig Dispense Refill  . albuterol (PROVENTIL HFA;VENTOLIN HFA) 108 (90 Base) MCG/ACT inhaler Inhale 2 puffs into the lungs every 6 (six) hours as needed for wheezing or shortness of breath. 1 Inhaler 3  . celecoxib (CELEBREX) 100 MG capsule Take 1 capsule (100 mg total) by mouth 2 (two) times daily. 60 capsule 2  . cycloSPORINE (RESTASIS) 0.05 % ophthalmic emulsion Place 1 drop into both eyes 2 (two) times daily.    Marland Kitchen doxycycline (VIBRAMYCIN) 100 MG capsule Take 1 capsule (100 mg total) by mouth 2 (two) times daily. 10 capsule 0  . gabapentin (NEURONTIN) 300 MG capsule Take 300 mg by mouth 4 (four) times daily.    Marland Kitchen HYDROcodone-acetaminophen (NORCO/VICODIN) 5-325 MG  tablet Take 1 tablet by mouth every 6 (six) hours as needed for moderate pain. 30 tablet 0  . hydrOXYzine (VISTARIL) 25 MG capsule Take 1 capsule (25 mg total) by mouth every 6 (six) hours as needed for anxiety. 30 capsule 0  . ipratropium-albuterol (DUONEB) 0.5-2.5 (3) MG/3ML SOLN Take 3 mLs by nebulization every 4 (four) hours as needed. 360 mL 1  . lamoTRIgine (LAMICTAL) 25 MG tablet Take 25 mg by mouth 2 (two) times daily.    Marland Kitchen lithium carbonate 300 MG capsule Take 300 mg by mouth 3 (three) times daily with meals.    . mometasone-formoterol (DULERA) 200-5 MCG/ACT AERO Inhale 2 puffs into the lungs 2 (two) times daily. 1 Inhaler 3  . pantoprazole (PROTONIX) 40 MG tablet Take 1 tablet (40 mg total) by mouth 2 (two) times daily. 90 tablet 1  . PARoxetine (PAXIL) 20 MG tablet Take 20 mg by mouth daily.    . risperidone (RISPERDAL) 4 MG tablet Take 4 mg by mouth 2 (two) times daily.    . simvastatin (ZOCOR) 20 MG tablet Take 1 tablet (20 mg total) by mouth at bedtime. 90 tablet 3   No current facility-administered medications on file prior to visit.    Family History  Problem Relation Age of Onset  . Dementia Mother   . Prostate cancer Father   . Dementia Father   . Prostate cancer Other   .  CAD Other    Social History   Social History  . Marital status: Single    Spouse name: N/A  . Number of children: N/A  . Years of education: N/A   Occupational History  . Not on file.   Social History Main Topics  . Smoking status: Current Every Day Smoker    Packs/day: 0.25    Years: 37.00    Types: Cigarettes  . Smokeless tobacco: Never Used  . Alcohol use Yes     Comment: occasionally  . Drug use: No     Comment: crack  . Sexual activity: Not Currently   Other Topics Concern  . Not on file   Social History Narrative  . No narrative on file    Review of Systems: Constitutional: Negative Skin: Negative, except dry and itchy.  HENT: Negative  Eyes: Negative  Neck:  Negative Respiratory: Negative Cardiovascular: Negative Gastrointestinal: Negative Genitourinary: + for frequency Musculoskeletal: + for low back pain  Neurological: + for headaches 2-3 times a week. Hematological: Negative  Psychiatric/Behavioral: Negative    Objective:   Vitals:   11/18/15 1031  BP: 134/71  Pulse: (!) 59  Resp: 16  Temp: 98.2 F (36.8 C)    Physical Exam: Constitutional: Patient appears well-developed and well-nourished. No distress. HENT: Normocephalic, atraumatic, External right and left ear normal. Oropharynx is clear and moist.  Eyes: Conjunctivae and EOM are normal. PERRLA, no scleral icterus. Neck: Normal ROM. Neck supple. No lymphadenopathy, No thyromegaly. CVS: RRR, S1/S2 +, no murmurs, no gallops, no rubs Pulmonary: Effort and breath sounds normal, no stridor, rhonchi, wheezes, rales.  Abdominal: Soft. Normoactive BS,, no distension, tenderness, rebound or guarding.  Musculoskeletal: Normal range of motion. No edema and no tenderness.  Neuro: Alert.Normal muscle tone coordination. Non-focal Skin: Skin is warm and dry. No rash noted. Not diaphoretic. No erythema. No pallor. Psychiatric: Normal mood and affect. Behavior, judgment, thought content normal.  Lab Results  Component Value Date   WBC 7.9 09/22/2015   HGB 11.8 (L) 09/22/2015   HCT 37.4 09/22/2015   MCV 83.5 09/22/2015   PLT 265 09/22/2015   Lab Results  Component Value Date   CREATININE 0.65 09/22/2015   BUN 8 09/22/2015   NA 139 09/22/2015   K 3.8 09/22/2015   CL 99 (L) 09/22/2015   CO2 31 09/22/2015    Lab Results  Component Value Date   HGBA1C 6.7 (H) 12/23/2014   Lipid Panel     Component Value Date/Time   CHOL 312 (H) 12/23/2014 1212   TRIG 288 (H) 12/23/2014 1212   HDL 70 12/23/2014 1212   CHOLHDL 4.5 12/23/2014 1212   VLDL 58 (H) 12/23/2014 1212   LDLCALC 184 (H) 12/23/2014 1212       Assessment and plan:   1. Essential hypertension  - CBC with  Differential - COMPLETE METABOLIC PANEL WITH GFR - Hemoglobin A1c - Lipid panel - amLODipine (NORVASC) 10 MG tablet; Take 1 tablet (10 mg total) by mouth daily.  Dispense: 90 tablet; Refill: 1 - metoprolol succinate (TOPROL-XL) 50 MG 24 hr tablet; Take 2 tablets (100 mg total) by mouth daily. Take with or immediately following a meal.  Dispense: 180 tablet; Refill: 2  2. Screening breast examination  - MM DIGITAL SCREENING BILATERAL; Future  3. Special screening for malignant neoplasms, colon  - Ambulatory referral to Gastroenterology  4. Need for prophylactic vaccination against Streptococcus pneumoniae (pneumococcus)  - pneumococcal 23 valent vaccine (PNU-IMMUNE) injection 0.5 mL; Inject 0.5  mLs into the muscle tomorrow at 10 am.  5. Pedal edema  - furosemide (LASIX) 20 MG tablet; Take 1 tablet (20 mg total) by mouth daily.  Dispense: 30 tablet; Refill: 3  6. Tachycardia  - metoprolol succinate (TOPROL-XL) 50 MG 24 hr tablet; Take 2 tablets (100 mg total) by mouth daily. Take with or immediately following a meal.  Dispense: 180 tablet; Refill: 2  7. Bipolar Disorder -I have encouraged her to call Medicaid and find out who will take her insurance   No Follow-up on file.  The patient was given clear instructions to go to ER or return to medical center if symptoms don't improve, worsen or new problems develop. The patient verbalized understanding.    Henrietta HooverLinda C Bernhardt FNP  11/18/2015, 12:06 PM

## 2015-11-23 ENCOUNTER — Other Ambulatory Visit: Payer: Self-pay | Admitting: Family Medicine

## 2015-11-23 DIAGNOSIS — G4700 Insomnia, unspecified: Secondary | ICD-10-CM

## 2015-12-14 ENCOUNTER — Other Ambulatory Visit: Payer: Self-pay | Admitting: Family Medicine

## 2016-01-02 ENCOUNTER — Encounter: Payer: Self-pay | Admitting: Internal Medicine

## 2016-01-06 ENCOUNTER — Other Ambulatory Visit: Payer: Self-pay | Admitting: Family Medicine

## 2016-01-06 DIAGNOSIS — E7800 Pure hypercholesterolemia, unspecified: Secondary | ICD-10-CM

## 2016-01-16 ENCOUNTER — Emergency Department (HOSPITAL_COMMUNITY): Payer: Medicaid Other

## 2016-01-16 ENCOUNTER — Encounter (HOSPITAL_COMMUNITY): Payer: Self-pay | Admitting: *Deleted

## 2016-01-16 ENCOUNTER — Emergency Department (HOSPITAL_COMMUNITY)
Admission: EM | Admit: 2016-01-16 | Discharge: 2016-01-16 | Disposition: A | Discharge status: Home / Self Care | Payer: Medicaid Other | Attending: Emergency Medicine | Admitting: Emergency Medicine

## 2016-01-16 DIAGNOSIS — F17210 Nicotine dependence, cigarettes, uncomplicated: Secondary | ICD-10-CM | POA: Diagnosis not present

## 2016-01-16 DIAGNOSIS — S6992XA Unspecified injury of left wrist, hand and finger(s), initial encounter: Secondary | ICD-10-CM | POA: Diagnosis present

## 2016-01-16 DIAGNOSIS — S63502A Unspecified sprain of left wrist, initial encounter: Secondary | ICD-10-CM

## 2016-01-16 DIAGNOSIS — S6392XA Sprain of unspecified part of left wrist and hand, initial encounter: Secondary | ICD-10-CM | POA: Diagnosis not present

## 2016-01-16 DIAGNOSIS — W009XXA Unspecified fall due to ice and snow, initial encounter: Secondary | ICD-10-CM | POA: Diagnosis not present

## 2016-01-16 DIAGNOSIS — Y999 Unspecified external cause status: Secondary | ICD-10-CM | POA: Diagnosis not present

## 2016-01-16 DIAGNOSIS — Z79899 Other long term (current) drug therapy: Secondary | ICD-10-CM | POA: Insufficient documentation

## 2016-01-16 DIAGNOSIS — Y9389 Activity, other specified: Secondary | ICD-10-CM | POA: Insufficient documentation

## 2016-01-16 DIAGNOSIS — I10 Essential (primary) hypertension: Secondary | ICD-10-CM | POA: Insufficient documentation

## 2016-01-16 DIAGNOSIS — S5001XA Contusion of right elbow, initial encounter: Secondary | ICD-10-CM | POA: Diagnosis not present

## 2016-01-16 DIAGNOSIS — J45909 Unspecified asthma, uncomplicated: Secondary | ICD-10-CM | POA: Insufficient documentation

## 2016-01-16 DIAGNOSIS — Y9289 Other specified places as the place of occurrence of the external cause: Secondary | ICD-10-CM | POA: Insufficient documentation

## 2016-01-16 DIAGNOSIS — W19XXXA Unspecified fall, initial encounter: Secondary | ICD-10-CM

## 2016-01-16 MED ORDER — HYDROCODONE-ACETAMINOPHEN 5-325 MG PO TABS
1.0000 | ORAL_TABLET | Freq: Once | ORAL | Status: AC
  Administered 2016-01-16: 1 via ORAL
  Filled 2016-01-16: qty 1

## 2016-01-16 MED ORDER — HYDROCODONE-ACETAMINOPHEN 5-325 MG PO TABS: 1.0000 | ORAL_TABLET | Freq: Four times a day (QID) | ORAL | 0 refills | Status: DC | PRN

## 2016-01-16 NOTE — ED Triage Notes (Signed)
PT reports falling at 1330 today and now has pain and swelling  To left hand.

## 2016-01-16 NOTE — ED Provider Notes (Signed)
[facility]-EMERGENCY DEPT Provider Note   CSN: [id number] Arrival date & time: 01/16/16  1604 By signing my name below, I, Bridgette Habermann, attest that this documentation has been prepared under the direction and in the presence of Kerrie Buffalo, [place]. Electronically Signed: Bridgette Habermann, ED Scribe. 01/16/16. 4:45 PM.  History   Chief Complaint Chief Complaint  Patient presents with  . Fall  . Hand Injury   HPI Comments: Kathleen Herrera is a 55 y.o. female with h/o HTN who presents to the Emergency Department complaining of 7/10 left wrist and hand pain and swelling s/p mechanical fall ~1:30pm today. She also complains of right elbow pain. Pt states she may have slipped on an ice patch and fell. She denies LOC or head injury. She has not taken any OTC medications PTA. She denies any additional injuries. Pt further denies fever, chills, or any other associated symptoms.   The history is provided by the patient. No language interpreter was used.    Past Medical History:  Diagnosis Date  . Arthritis   . Bronchitis   . Depression   . Fibromyalgia Y-2  . Fibromyalgia unknown  . Hyperlipemia   . Hypertension   . Post traumatic stress disorder   . Sinus congestion     Patient Active Problem List   Diagnosis Date Noted  . Asthma   . CAP (community acquired pneumonia) 09/19/2015  . Dyspnea 09/19/2015  . Acute respiratory failure with hypoxia (HCC) 09/19/2015  . Abnormality of gait 06/29/2014  . Essential hypertension 06/29/2014  . Hyperlipidemia 06/29/2014  . Metabolic syndrome 06/29/2014  . Neuropathic pain 06/29/2014  . Gastroesophageal reflux disease without esophagitis 06/29/2014  . Tachycardia 06/29/2014  . At risk for osteopenia 06/29/2014  . Excessive daytime sleepiness 06/29/2014  . Fibromyalgia   . Abnormal LFTs 03/06/2013  . Extreme obesity (HCC) 12/18/2012  . Fatty infiltration of liver 07/15/2012  . Gonalgia 07/15/2012  . LBP (low back pain) 07/15/2012  . Arthritis,  degenerative 06/12/2012  . Arthritis 05/26/2012  . Bipolar 2 disorder (HCC) 04/17/2012  . Clinical depression 04/17/2012    Past Surgical History:  Procedure Laterality Date  . ABDOMINAL HYSTERECTOMY      OB History    No data available       Home Medications    Prior to Admission medications   Medication Sig Start Date End Date Taking? Authorizing Provider  albuterol (PROVENTIL HFA;VENTOLIN HFA) 108 (90 Base) MCG/ACT inhaler Inhale 2 puffs into the lungs every 6 (six) hours as needed for wheezing or shortness of breath. 09/23/15   Dorothea Ogle, MD  amLODipine (NORVASC) 10 MG tablet Take 1 tablet (10 mg total) by mouth daily. 11/18/15   Henrietta Hoover, NP  celecoxib (CELEBREX) 100 MG capsule Take 1 capsule (100 mg total) by mouth 2 (two) times daily. 10/05/15   Henrietta Hoover, NP  cycloSPORINE (RESTASIS) 0.05 % ophthalmic emulsion Place 1 drop into both eyes 2 (two) times daily.    Historical Provider, MD  doxycycline (VIBRAMYCIN) 100 MG capsule Take 1 capsule (100 mg total) by mouth 2 (two) times daily. 09/23/15   Dorothea Ogle, MD  furosemide (LASIX) 20 MG tablet Take 1 tablet (20 mg total) by mouth daily. 11/18/15   Henrietta Hoover, NP  gabapentin (NEURONTIN) 300 MG capsule Take 300 mg by mouth 4 (four) times daily.    Historical Provider, MD  HYDROcodone-acetaminophen (NORCO) 5-325 MG tablet Take 1 tablet by mouth every 6 (six) hours as needed.  01/16/16   Hope Orlene OchM Neese, NP  hydrOXYzine (VISTARIL) 25 MG capsule TAKE 1 CAPSULE (25 MG TOTAL) BY MOUTH EVERY 6 (SIX) HOURS AS NEEDED FOR ANXIETY. 11/23/15   Henrietta HooverLinda C Bernhardt, NP  ipratropium-albuterol (DUONEB) 0.5-2.5 (3) MG/3ML SOLN Take 3 mLs by nebulization every 4 (four) hours as needed. 09/23/15   Dorothea OgleIskra M Myers, MD  lamoTRIgine (LAMICTAL) 25 MG tablet Take 25 mg by mouth 2 (two) times daily.    Historical Provider, MD  lithium carbonate 300 MG capsule Take 300 mg by mouth 3 (three) times daily with meals.    Historical Provider,  MD  metoprolol succinate (TOPROL-XL) 50 MG 24 hr tablet Take 2 tablets (100 mg total) by mouth daily. Take with or immediately following a meal. 11/18/15   Henrietta HooverLinda C Bernhardt, NP  mometasone-formoterol [facility](DULERA) 200-5 MCG/ACT AERO Inhale 2 puffs into the lungs 2 (two) times daily. 09/23/15   Dorothea OgleIskra M Myers, MD  pantoprazole (PROTONIX) 40 MG tablet Take 1 tablet (40 mg total) by mouth 2 (two) times daily. 03/28/15   Henrietta HooverLinda C Bernhardt, NP  PARoxetine (PAXIL) 20 MG tablet Take 20 mg by mouth daily.    Historical Provider, MD  PARoxetine (PAXIL) 20 MG tablet TAKE 1 TABLET (20 MG TOTAL) BY MOUTH 1 DAY OR 1 DOSE. 01/06/16   Henrietta HooverLinda C Bernhardt, NP  PROVENTIL HFA 108 [phone](90 Base) MCG/ACT inhaler INHALE TWO   PUFFS INTO THE LUNGS EVERY 6 HOURS AS NEEDED FOR WHEEZING. 12/14/15   Henrietta HooverLinda C Bernhardt, NP  risperidone (RISPERDAL) 4 MG tablet Take 4 mg by mouth 2 (two) times daily.    Historical Provider, MD  simvastatin (ZOCOR) 20 MG tablet TAKE ONE TABLET   (20 MG TOTAL ) BY MOUTH   AT BEDTIME 01/06/16   Henrietta HooverLinda C Bernhardt, NP    Family History Family History  Problem Relation Age of Onset  . Dementia Mother   . Prostate cancer Father   . Dementia Father   . Prostate cancer Other   . CAD Other     Social History Social History  Substance Use Topics  . Smoking status: Current Every Day Smoker    Packs/day: 0.25    Years: 37.00    Types: Cigarettes  . Smokeless tobacco: Never Used  . Alcohol use Yes     Comment: occasionally     Allergies   Patient has no known allergies.   Review of Systems Review of Systems  Constitutional: Negative for chills and fever.  HENT: Negative.   Eyes: Negative for visual disturbance.  Genitourinary:       No loss of control of bladder or bowels  Musculoskeletal: Positive for arthralgias, joint swelling and myalgias.       Left hand and wrist pain, right elbow pain.  Skin: Negative for wound.  Neurological: Negative for syncope and headaches.  Psychiatric/Behavioral:  Negative for confusion.  All other systems reviewed and are negative.    Physical Exam Updated Vital Signs BP 129/87 (BP Location: Right Arm)   Pulse 60   Temp 98.3 F (36.8 C) (Oral)   Resp 16   SpO2 96%   Physical Exam  Constitutional: She appears well-developed and well-nourished. No distress.  HENT:  Head: Normocephalic.  Eyes: Conjunctivae and EOM are normal.  Neck: Normal range of motion. Neck supple.  Cardiovascular: Normal rate.   Pulses:      Radial pulses are 2+ on the left side.  Pulmonary/Chest: Effort normal. No respiratory distress.  Musculoskeletal: She exhibits  edema and tenderness.       Left wrist: She exhibits tenderness and swelling. She exhibits no deformity and no laceration. Decreased range of motion: due to pain.       Left hand: She exhibits tenderness and swelling. She exhibits normal capillary refill and no laceration. Normal sensation noted. Normal strength noted. She exhibits no thumb/finger opposition.  Limited ROM of left wrist due to pain, adequate circulation. Tenderness and swelling noted to dorsum of left hand and left wrist. Tender at the olecranon process of right elbow with palpation and ROM, mild swelling. Radial pulses 2+, adequate circulation.  Full range of motion of the left elbow and shoulder without pain.   Neurological: She is alert.  Skin: Skin is warm and dry.  Psychiatric: She has a normal mood and affect. Her behavior is normal.  Nursing note and vitals reviewed.    ED Treatments / Results  DIAGNOSTIC STUDIES: Oxygen Saturation is 98% on RA, normal by my interpretation.    COORDINATION OF CARE: 4:44 PM Discussed treatment plan with pt at bedside which includes x-rau and pt agreed to plan.  Labs (all labs ordered are listed, but only abnormal results are displayed) Labs Reviewed - No data to display  Radiology No results found.  Procedures Procedures (including critical care time)  Medications Ordered in  ED Medications  HYDROcodone-acetaminophen (NORCO/VICODIN) 5-325 MG per tablet 1 tablet (1 tablet Oral Given 01/16/16 1803)     Initial Impression / Assessment and Plan / ED Course  I have reviewed the triage vital signs and the nursing notes.  Pertinent imaging results that were available during my care of the patient were reviewed by me and considered in my medical decision making (see chart for details).  Clinical Course   55 y.o. female with left wrist and hand pain and right elbow pain s/p fall stable for d/c without focal neuro deficits and no fracture or dislocation noted on x-rays. She will f/u with her PCP or return here if symptoms worsen. Will treat for pain and inflammation. Wrist splint applied prior to d/c.   Final Clinical Impressions(s) / ED Diagnoses   Final diagnoses:  Left wrist sprain, initial encounter  Hand sprain, left, initial encounter  Contusion of right elbow, initial encounter  Fall, initial encounter    New Prescriptions Discharge Medication List as of 01/16/2016  7:07 PM    I personally performed the services described in this documentation, which was scribed in my presence. The recorded information has been reviewed and is accurate.     BromideHope M Neese, [place]NP 01/19/16 2203    Loren Raceravid Yelverton, MD 01/21/16 [phone]

## 2016-01-17 ENCOUNTER — Encounter: Payer: Medicaid Other | Admitting: Internal Medicine

## 2016-01-24 ENCOUNTER — Ambulatory Visit
Admission: RE | Admit: 2016-01-24 | Discharge: 2016-01-24 | Disposition: A | Discharge status: Home / Self Care | Payer: Medicaid Other | Source: Ambulatory Visit | Attending: Family Medicine | Admitting: Family Medicine

## 2016-01-24 DIAGNOSIS — Z1239 Encounter for other screening for malignant neoplasm of breast: Secondary | ICD-10-CM

## 2016-01-31 ENCOUNTER — Other Ambulatory Visit: Payer: Self-pay | Admitting: Family Medicine

## 2016-02-01 ENCOUNTER — Other Ambulatory Visit: Payer: Self-pay | Admitting: Family Medicine

## 2016-02-01 DIAGNOSIS — G4700 Insomnia, unspecified: Secondary | ICD-10-CM

## 2016-02-01 DIAGNOSIS — K219 Gastro-esophageal reflux disease without esophagitis: Secondary | ICD-10-CM

## 2016-02-07 ENCOUNTER — Other Ambulatory Visit: Payer: Self-pay | Admitting: Family Medicine

## 2016-02-16 ENCOUNTER — Telehealth: Payer: Self-pay | Admitting: *Deleted

## 2016-02-16 NOTE — Telephone Encounter (Signed)
Noted  

## 2016-02-16 NOTE — Telephone Encounter (Signed)
BMI < 50 Seems like medical conditions are stable Direct ok

## 2016-02-16 NOTE — Telephone Encounter (Signed)
Direct screening colon.  Patient has a lot of medical issues, and high BMI. Okay for LEC or OV? Please review chart.

## 2016-02-20 ENCOUNTER — Ambulatory Visit: Payer: Medicaid Other | Admitting: Family Medicine

## 2016-02-28 ENCOUNTER — Other Ambulatory Visit: Payer: Self-pay | Admitting: Family Medicine

## 2016-03-06 ENCOUNTER — Encounter: Payer: Medicaid Other | Admitting: Internal Medicine

## 2016-03-12 ENCOUNTER — Encounter: Payer: Self-pay | Admitting: Family Medicine

## 2016-03-12 ENCOUNTER — Ambulatory Visit (INDEPENDENT_AMBULATORY_CARE_PROVIDER_SITE_OTHER): Payer: Medicaid Other | Admitting: Family Medicine

## 2016-03-12 VITALS — BP 146/92 | HR 100 | Temp 98.9°F | Resp 20 | Wt 254.8 lb

## 2016-03-12 DIAGNOSIS — R269 Unspecified abnormalities of gait and mobility: Secondary | ICD-10-CM

## 2016-03-12 DIAGNOSIS — Z23 Encounter for immunization: Secondary | ICD-10-CM

## 2016-03-12 DIAGNOSIS — R000 Tachycardia, unspecified: Secondary | ICD-10-CM | POA: Diagnosis not present

## 2016-03-12 DIAGNOSIS — R600 Localized edema: Secondary | ICD-10-CM | POA: Diagnosis not present

## 2016-03-12 DIAGNOSIS — K219 Gastro-esophageal reflux disease without esophagitis: Secondary | ICD-10-CM

## 2016-03-12 DIAGNOSIS — I10 Essential (primary) hypertension: Secondary | ICD-10-CM

## 2016-03-12 DIAGNOSIS — E7800 Pure hypercholesterolemia, unspecified: Secondary | ICD-10-CM

## 2016-03-12 LAB — CBC WITH DIFFERENTIAL/PLATELET
Basophils Absolute: 0 cells/uL (ref 0–200)
Basophils Relative: 0 %
EOS PCT: 2 %
Eosinophils Absolute: 142 cells/uL (ref 15–500)
HCT: 45.6 % — ABNORMAL HIGH (ref 35.0–45.0)
Hemoglobin: 15.2 g/dL (ref 11.7–15.5)
LYMPHS PCT: 38 %
Lymphs Abs: 2698 cells/uL (ref 850–3900)
MCH: 26.6 pg — ABNORMAL LOW (ref 27.0–33.0)
MCHC: 33.3 g/dL (ref 32.0–36.0)
MCV: 79.7 fL — ABNORMAL LOW (ref 80.0–100.0)
MPV: 9.7 fL (ref 7.5–12.5)
Monocytes Absolute: 497 cells/uL (ref 200–950)
Monocytes Relative: 7 %
Neutro Abs: 3763 cells/uL (ref 1500–7800)
Neutrophils Relative %: 53 %
Platelets: 232 10*3/uL (ref 140–400)
RBC: 5.72 MIL/uL — AB (ref 3.80–5.10)
RDW: 15.8 % — AB (ref 11.0–15.0)
WBC: 7.1 10*3/uL (ref 3.8–10.8)

## 2016-03-12 LAB — COMPLETE METABOLIC PANEL WITH GFR
ALBUMIN: 4.8 g/dL (ref 3.6–5.1)
ALK PHOS: 69 U/L (ref 33–130)
ALT: 22 U/L (ref 6–29)
AST: 28 U/L (ref 10–35)
BILIRUBIN TOTAL: 0.6 mg/dL (ref 0.2–1.2)
BUN: 10 mg/dL (ref 7–25)
CO2: 28 mmol/L (ref 20–31)
CREATININE: 0.91 mg/dL (ref 0.50–1.05)
Calcium: 10 mg/dL (ref 8.6–10.4)
Chloride: 102 mmol/L (ref 98–110)
GFR, Est African American: 82 mL/min (ref >=60)
GFR, Est Non African American: 71 mL/min (ref >=60)
Glucose, Bld: 116 mg/dL — ABNORMAL HIGH (ref 65–99)
Potassium: 3.8 mmol/L (ref 3.5–5.3)
Sodium: 140 mmol/L (ref 135–146)
Total Protein: 7.8 g/dL (ref 6.1–8.1)

## 2016-03-12 LAB — LIPID PANEL
CHOLESTEROL: 247 mg/dL — AB (ref <200)
HDL: 78 mg/dL (ref >50)
LDL Cholesterol: 141 mg/dL — ABNORMAL HIGH (ref <100)
TRIGLYCERIDES: 141 mg/dL (ref <150)
Total CHOL/HDL Ratio: 3.2 Ratio (ref <5.0)
VLDL: 28 mg/dL (ref <30)

## 2016-03-12 MED ORDER — SIMVASTATIN 20 MG PO TABS: ORAL_TABLET | ORAL | 1 refills | Status: DC

## 2016-03-12 MED ORDER — GABAPENTIN 300 MG PO CAPS: 300.0000 mg | ORAL_CAPSULE | Freq: Four times a day (QID) | ORAL | 3 refills | Status: DC

## 2016-03-12 MED ORDER — PANTOPRAZOLE SODIUM 40 MG PO TBEC: DELAYED_RELEASE_TABLET | ORAL | 2 refills | Status: DC

## 2016-03-12 MED ORDER — FUROSEMIDE 20 MG PO TABS: 20.0000 mg | ORAL_TABLET | Freq: Every day | ORAL | 3 refills | Status: DC

## 2016-03-12 MED ORDER — AMLODIPINE BESYLATE 10 MG PO TABS: 10.0000 mg | ORAL_TABLET | Freq: Every day | ORAL | 1 refills | Status: DC

## 2016-03-12 MED ORDER — METOPROLOL SUCCINATE ER 50 MG PO TB24: 100.0000 mg | ORAL_TABLET | Freq: Every day | ORAL | 2 refills | Status: DC

## 2016-03-12 NOTE — Progress Notes (Signed)
Kathleen Herrera, is a 56 y.o. female  ZOX:[id number]  [id number]  DOB - 03-18-1960  CC:  Chief Complaint  Patient presents with  . Follow-up    medication refilled       HPI: Chrystine Early Chars is a 56 y.o. female here for routine follow-up of chronic conditions. She has a history of hypertension, hyperlipidemia, depression, Bipolar 2 disorder, fibromyalgia, arthritis. Since I saw her last she has established with [facility] and they are managing her mental health meds.  She has not had her blood pressure medications for 2 days. (ran out). Her main concern is of chronic widespread pain.   Health maintentance: she has a colonoscopy scheduled, mammogram is current. She has had a hysterectomy. She will accept a flu shot today.    No Known Allergies Past Medical History:  Diagnosis Date  . Arthritis   . Bronchitis   . Depression   . Fibromyalgia Y-2  . Fibromyalgia unknown  . Hyperlipemia   . Hypertension   . Post traumatic stress disorder   . Sinus congestion    Current Outpatient Prescriptions on File Prior to Visit  Medication Sig Dispense Refill  . albuterol (PROVENTIL HFA;VENTOLIN HFA) 108 (90 Base) MCG/ACT inhaler Inhale 2 puffs into the lungs every 6 (six) hours as needed for wheezing or shortness of breath. 1 Inhaler 3  . cycloSPORINE (RESTASIS) 0.05 % ophthalmic emulsion Place 1 drop into both eyes 2 (two) times daily.    Marland Kitchen HYDROcodone-acetaminophen (NORCO) 5-325 MG tablet Take 1 tablet by mouth every 6 (six) hours as needed. 15 tablet 0  . hydrOXYzine (VISTARIL) 25 MG capsule TAKE 1 CAPSULE (25 MG TOTAL) BY MOUTH EVERY 6 HOURS AS NEEDED FOR ANXIETY. 30 capsule 2  . ipratropium-albuterol (DUONEB) 0.5-2.5 (3) MG/3ML SOLN Take 3 mLs by nebulization every 4 (four) hours as needed. 360 mL 1  . lamoTRIgine (LAMICTAL) 25 MG tablet Take 25 mg by mouth 2 (two) times daily.    Marland Kitchen lithium carbonate 300 MG capsule Take 300 mg by mouth 3 (three) times daily with meals.    .  mometasone-formoterol (DULERA) 200-5 MCG/ACT AERO Inhale 2 puffs into the lungs 2 (two) times daily. 1 Inhaler 3  . PROVENTIL HFA 108 (90 Base) MCG/ACT inhaler INHALE TWO   PUFFS INTO THE LUNGS EVERY 6 HOURS AS NEEDED FOR WHEEZING. 6.7 Inhaler 3  . PARoxetine (PAXIL) 20 MG tablet Take 20 mg by mouth daily.     No current facility-administered medications on file prior to visit.    Family History  Problem Relation Age of Onset  . Dementia Mother   . Prostate cancer Father   . Dementia Father   . Prostate cancer Other   . CAD Other    Social History   Social History  . Marital status: Single    Spouse name: N/A  . Number of children: N/A  . Years of education: N/A   Occupational History  . Not on file.   Social History Main Topics  . Smoking status: Current Every Day Smoker    Packs/day: 0.25    Years: 37.00    Types: Cigarettes  . Smokeless tobacco: Never Used  . Alcohol use Yes     Comment: occasionally  . Drug use: No     Comment: crack  . Sexual activity: Not Currently   Other Topics Concern  . Not on file   Social History Narrative  . No narrative on file    Review of Systems: Constitutional: Negative  Skin: Negative HENT: Negative  Eyes: Negative  Neck: Negative Respiratory: Negative Cardiovascular: Negative Gastrointestinal: Negative Genitourinary: Negative  Musculoskeletal: + for wide spread chronic pain.  Neurological: + for occ headaches Hematological: Negative  Psychiatric/Behavioral: + for depression/anxiety   Objective:   Vitals:   03/12/16 1403  BP: (!) 146/92  Pulse: 100  Resp: 20  Temp: 98.9 F (37.2 C)    Physical Exam: Constitutional: Patient appears well-developed and well-nourished. No distress. HENT: Normocephalic, atraumatic, External right and left ear normal. Oropharynx is clear and moist.  Eyes: Conjunctivae and EOM are normal. PERRLA, no scleral icterus. Neck: Normal ROM. Neck supple. No lymphadenopathy, No  thyromegaly. CVS: RRR, S1/S2 +, no murmurs, no gallops, no rubs Pulmonary: Effort and breath sounds normal, no stridor, rhonchi, wheezes, rales.  Abdominal: Soft. Normoactive BS,, no distension, tenderness, rebound or guarding.  Musculoskeletal: Normal range of motion. No edema and no tenderness.  Neuro: Alert.Normal muscle tone coordination. Non-focal Skin: Skin is warm and dry. No rash noted. Not diaphoretic. No erythema. No pallor. Psychiatric: Normal mood and affect. Behavior, judgment, thought content normal.  Lab Results  Component Value Date   WBC 5.8 11/18/2015   HGB 13.6 11/18/2015   HCT 43.3 11/18/2015   MCV 81.9 11/18/2015   PLT 281 11/18/2015   Lab Results  Component Value Date   CREATININE 0.92 11/18/2015   BUN 8 11/18/2015   NA 138 11/18/2015   K 4.3 11/18/2015   CL 102 11/18/2015   CO2 30 11/18/2015    Lab Results  Component Value Date   HGBA1C 6.0 (H) 11/18/2015   Lipid Panel     Component Value Date/Time   CHOL 256 (H) 11/18/2015 1106   TRIG 265 (H) 11/18/2015 1106   HDL 65 11/18/2015 1106   CHOLHDL 3.9 11/18/2015 1106   VLDL 53 (H) 11/18/2015 1106   LDLCALC 138 (H) 11/18/2015 1106        Assessment and plan:   1. Essential hypertension  - COMPLETE METABOLIC PANEL WITH GFR - CBC with Differential - Lipid panel - amLODipine (NORVASC) 10 MG tablet; Take 1 tablet (10 mg total) by mouth daily.  Dispense: 90 tablet; Refill: 1 - furosemide (LASIX) 20 MG tablet; Take 1 tablet (20 mg total) by mouth daily.  Dispense: 30 tablet; Refill: 3 - metoprolol succinate (TOPROL-XL) 50 MG 24 hr tablet; Take 2 tablets (100 mg total) by mouth daily. Take with or immediately following a meal.  Dispense: 180 tablet; Refill: 2  2. Gait disorder  - Cane adjustable single point  . Neuropathic pain - gabapentin (NEURONTIN) 300 MG capsule; Take 1 capsule (300 mg total) by mouth 4 (four) times daily.  Dispense: 12 capsule; Refill: 3  4. Pedal edema  - furosemide  (LASIX) 20 MG tablet; Take 1 tablet (20 mg total) by mouth daily.  Dispense: 30 tablet; Refill: 3  4. Tachycardia  - metoprolol succinate (TOPROL-XL) 50 MG 24 hr tablet; Take 2 tablets (100 mg total) by mouth daily. Take with or immediately following a meal.  Dispense: 180 tablet; Refill: 2  5. Gastroesophageal reflux disease, esophagitis presence not specified  - pantoprazole (PROTONIX) 40 MG tablet; TAKE ONE TABLET   (40 MG TOTAL ) BY MOUTH   TWO   TIMES DAILY  Dispense: 90 tablet; Refill: 2  6. Pure hypercholesterolemia  - simvastatin (ZOCOR) 20 MG tablet; TAKE ONE TABLET   (20 MG TOTAL ) BY MOUTH   AT BEDTIME  Dispense: 90 tablet; Refill: 1  7.  Need for influenza vaccination  - Flu Vaccine QUAD 36+ mos PF IM (Fluarix & Fluzone Quad PF)   Return in about 3 months (around 06/09/2016).  The patient was given clear instructions to go to ER or return to medical center if symptoms don't improve, worsen or new problems develop. The patient verbalized understanding.    Henrietta Hoover FNP  03/12/2016, 2:57 PM

## 2016-03-12 NOTE — Patient Instructions (Signed)
Tramadol interacts with Paxil so I cannot order Will see about sending to pain clinic.

## 2016-03-13 ENCOUNTER — Other Ambulatory Visit: Payer: Self-pay | Admitting: Family Medicine

## 2016-03-13 MED ORDER — PRAVASTATIN SODIUM 40 MG PO TABS
40.0000 mg | ORAL_TABLET | Freq: Every day | ORAL | 3 refills | Status: DC
Start: 2016-03-13 — End: 2016-10-12

## 2016-03-22 ENCOUNTER — Ambulatory Visit (AMBULATORY_SURGERY_CENTER): Payer: Self-pay

## 2016-03-22 VITALS — Ht 63.0 in | Wt 253.0 lb

## 2016-03-22 DIAGNOSIS — Z1211 Encounter for screening for malignant neoplasm of colon: Secondary | ICD-10-CM

## 2016-03-22 NOTE — Progress Notes (Signed)
No allergies to eggs or soy No past problems with anesthesia resp arrested when she had pneumonia No diet meds No home oxygen  Declined emmi

## 2016-04-05 ENCOUNTER — Encounter: Payer: Medicaid Other | Admitting: Internal Medicine

## 2016-04-09 ENCOUNTER — Other Ambulatory Visit: Payer: Self-pay | Admitting: Family Medicine

## 2016-04-30 ENCOUNTER — Other Ambulatory Visit: Payer: Self-pay | Admitting: Family Medicine

## 2016-05-02 ENCOUNTER — Encounter: Payer: Medicaid Other | Admitting: Internal Medicine

## 2016-05-04 ENCOUNTER — Other Ambulatory Visit: Payer: Self-pay | Admitting: Family Medicine

## 2016-05-04 DIAGNOSIS — J45909 Unspecified asthma, uncomplicated: Secondary | ICD-10-CM

## 2016-05-07 ENCOUNTER — Other Ambulatory Visit: Payer: Self-pay | Admitting: Family Medicine

## 2016-05-07 DIAGNOSIS — J45909 Unspecified asthma, uncomplicated: Secondary | ICD-10-CM

## 2016-05-08 ENCOUNTER — Telehealth: Payer: Self-pay | Admitting: Internal Medicine

## 2016-05-08 NOTE — Telephone Encounter (Signed)
Patient canceled procedure for 05/10/16 on 05/08/16. The pt states she did not know about the appt on this day. Do you want to charge late fee? Patient also states that she can not get the prep for the procedure and has a hard time finding a ride and would like a call to discuss options before she reschedules.

## 2016-05-09 NOTE — Telephone Encounter (Signed)
Left message for patient to call back  

## 2016-05-10 ENCOUNTER — Encounter: Payer: Medicaid Other | Admitting: Internal Medicine

## 2016-05-11 NOTE — Telephone Encounter (Signed)
Left message for patient to call back  

## 2016-05-14 NOTE — Telephone Encounter (Signed)
No return call from the patient 

## 2016-06-12 ENCOUNTER — Ambulatory Visit: Payer: Medicaid Other | Admitting: Family Medicine

## 2016-07-31 ENCOUNTER — Other Ambulatory Visit: Payer: Self-pay | Admitting: Family Medicine

## 2016-08-16 ENCOUNTER — Ambulatory Visit (INDEPENDENT_AMBULATORY_CARE_PROVIDER_SITE_OTHER): Payer: Medicaid Other | Admitting: Family Medicine

## 2016-08-16 ENCOUNTER — Encounter: Payer: Self-pay | Admitting: Family Medicine

## 2016-08-16 VITALS — BP 136/74 | HR 58 | Temp 98.1°F | Resp 16 | Ht 63.0 in | Wt 254.0 lb

## 2016-08-16 DIAGNOSIS — B3789 Other sites of candidiasis: Secondary | ICD-10-CM | POA: Diagnosis not present

## 2016-08-16 DIAGNOSIS — N393 Stress incontinence (female) (male): Secondary | ICD-10-CM | POA: Diagnosis not present

## 2016-08-16 DIAGNOSIS — L259 Unspecified contact dermatitis, unspecified cause: Secondary | ICD-10-CM

## 2016-08-16 DIAGNOSIS — J069 Acute upper respiratory infection, unspecified: Secondary | ICD-10-CM | POA: Diagnosis not present

## 2016-08-16 DIAGNOSIS — R7309 Other abnormal glucose: Secondary | ICD-10-CM

## 2016-08-16 DIAGNOSIS — Z13220 Encounter for screening for lipoid disorders: Secondary | ICD-10-CM | POA: Diagnosis not present

## 2016-08-16 LAB — POCT URINALYSIS DIP (DEVICE)
Bilirubin Urine: NEGATIVE
GLUCOSE, UA: NEGATIVE mg/dL
Ketones, ur: NEGATIVE mg/dL
Leukocytes, UA: NEGATIVE
NITRITE: NEGATIVE
Protein, ur: NEGATIVE mg/dL
SPECIFIC GRAVITY, URINE: 1.02 (ref 1.005–1.030)
UROBILINOGEN UA: 0.2 mg/dL (ref 0.0–1.0)
pH: 5.5 (ref 5.0–8.0)

## 2016-08-16 LAB — POCT GLYCOSYLATED HEMOGLOBIN (HGB A1C): Hemoglobin A1C: 6

## 2016-08-16 MED ORDER — NYSTATIN POWD: 2 refills | Status: DC

## 2016-08-16 MED ORDER — BUDESONIDE-FORMOTEROL FUMARATE 80-4.5 MCG/ACT IN AERO: 2.0000 | INHALATION_SPRAY | Freq: Two times a day (BID) | RESPIRATORY_TRACT | 3 refills | Status: DC

## 2016-08-16 MED ORDER — HYDROCODONE-HOMATROPINE 5-1.5 MG/5ML PO SYRP: 5.0000 mL | ORAL_SOLUTION | Freq: Three times a day (TID) | ORAL | 0 refills | Status: DC | PRN

## 2016-08-16 MED ORDER — OXYBUTYNIN CHLORIDE ER 5 MG PO TB24: 5.0000 mg | ORAL_TABLET | Freq: Every day | ORAL | 0 refills | Status: DC

## 2016-08-16 MED ORDER — TRIAMCINOLONE ACETONIDE 0.1 % EX CREA: 1.0000 "application " | TOPICAL_CREAM | Freq: Two times a day (BID) | CUTANEOUS | 0 refills | Status: DC

## 2016-08-16 MED ORDER — CLOTRIMAZOLE-BETAMETHASONE 1-0.05 % EX CREA: 1.0000 "application " | TOPICAL_CREAM | Freq: Two times a day (BID) | CUTANEOUS | 0 refills | Status: DC

## 2016-08-16 NOTE — Patient Instructions (Addendum)
Ditropan take 1 tablet once daily for urge incontinence.  Perform Kegel exercises also to reduce incontinence episodes.  For cough, I have prescribed Hycodan cough syrup-this medication causes drowsiness, therefore take only as needed and no more than prescribed.  Skin rash on arms-Triamimcinlone ointment to affected area  Thigh fungal infection-apply Lotrisone cream and prevent apply nystatin powders between thighs.     Kegel Exercises Kegel exercises help strengthen the muscles that support the rectum, vagina, small intestine, bladder, and uterus. Doing Kegel exercises can help:  Improve bladder and bowel control.  Improve sexual response.  Reduce problems and discomfort during pregnancy.  Kegel exercises involve squeezing your pelvic floor muscles, which are the same muscles you squeeze when you try to stop the flow of urine. The exercises can be done while sitting, standing, or lying down, but it is best to vary your position. Phase 1 exercises 1. Squeeze your pelvic floor muscles tight. You should feel a tight lift in your rectal area. If you are a female, you should also feel a tightness in your vaginal area. Keep your stomach, buttocks, and legs relaxed. 2. Hold the muscles tight for up to 10 seconds. 3. Relax your muscles. Repeat this exercise 50 times a day or as many times as told by your health care provider. Continue to do this exercise for at least 4-6 weeks or for as long as told by your health care provider. This information is not intended to replace advice given to you by your health care provider. Make sure you discuss any questions you have with your health care provider. Document Released: 01/09/2012 Document Revised: 09/17/2015 Document Reviewed: 12/12/2014 Elsevier Interactive Patient Education  [employer]2018 [employer]

## 2016-08-16 NOTE — Progress Notes (Addendum)
Patient ID: Kathleen Herrera, female    DOB: [birth year], 56 y.o.   MRN: [id number]  PCP: Bing Neighbors, FNP  Chief Complaint  Patient presents with  . Kathleen Herrera    ON ARMS/ CHAFING BETWEEN THIGHS  . Urinary Incontinence  . Cough    Subjective:  HPI Kathleen Herrera is a 56 y.o. female presents for evaluation of skin Kathleen Herrera, urinary incontinence, and cough.  Kathleen Herrera Kathleen Herrera complains of inner groin skin irritation in bilateral forearm Kathleen Herrera. This is not a new problem. Groin Kathleen Herrera occurs often during periods of warm weather and when her inner thighs sweat profusely. Reports some excoriation from scratching. Denies drainage. Bilateral arm Kathleen Herrera is pruritic, and skin is dry, and has associated small papular lesions. Reports in the past using some form of steroid cream which relieved the Kathleen Herrera although uncertain of the name of medication.  Urinary Incontinence  Kathleen Herrera reports over the last few weeks experiencing episodes of stress incontinence, with coughing, laughing, and for bladder. Reports in the past she had no difficulty voiding urine long enough to reach the bathroom. Urinary incontinence has recently caused her to limit her ability to go out socially as she is afraid she may have an incontinent episode.   Cough/Asthma Reports a intermittent nonproductive cough over the last 3 weeks. Reports some associated wheezing occasionally. She has a history of asthma, and she has had to use her albuterol one inhaler for relief of wheezing or shortness of breath. She has not taken any medication for her cough. Denies any associated chest tightness, fever, headache, nasal drainage, or sore throat.  Social History   Social History  . Marital status: Single    Spouse name: N/A  . Number of children: N/A  . Years of education: N/A   Occupational History  . Not on file.   Social History Main Topics  . Smoking status: Current Every Day Smoker    Packs/day: 0.25    Years: 37.00    Types:  Cigarettes  . Smokeless tobacco: Never Used  . Alcohol use Yes     Comment: occasionally  . Drug use: No     Comment: crack  . Sexual activity: Not Currently   Other Topics Concern  . Not on file   Social History Narrative  . No narrative on file    Family History  Problem Relation Age of Onset  . Dementia Mother   . Prostate cancer Father   . Dementia Father   . Prostate cancer Other   . CAD Other    Review of Systems See HPI Patient Active Problem List   Diagnosis Date Noted  . Asthma   . CAP (community acquired pneumonia) 09/19/2015  . Dyspnea 09/19/2015  . Acute respiratory failure with hypoxia (HCC) 09/19/2015  . Abnormality of gait 06/29/2014  . Essential hypertension 06/29/2014  . Hyperlipidemia 06/29/2014  . Metabolic syndrome 06/29/2014  . Neuropathic pain 06/29/2014  . Gastroesophageal reflux disease without esophagitis 06/29/2014  . Tachycardia 06/29/2014  . At risk for osteopenia 06/29/2014  . Excessive daytime sleepiness 06/29/2014  . Fibromyalgia   . Abnormal LFTs 03/06/2013  . Extreme obesity 12/18/2012  . Fatty infiltration of liver 07/15/2012  . Gonalgia 07/15/2012  . LBP (low back pain) 07/15/2012  . Arthritis, degenerative 06/12/2012  . Arthritis 05/26/2012  . Bipolar 2 disorder (HCC) 04/17/2012  . Clinical depression 04/17/2012    No Known Allergies  Prior to Admission medications   Medication Sig Start Date End Date  Taking? Authorizing Provider  ADVAIR DISKUS 500-50 MCG/DOSE AEPB INHALE 1 PUFF INTO THE LUNGS TWICE DAILY. 05/07/16  Yes Bing NeighborsHarris, Kaire Stary S, FNP  albuterol (PROVENTIL HFA;VENTOLIN HFA) 108 (90 Base) MCG/ACT inhaler Inhale 2 puffs into the lungs every 6 (six) hours as needed for wheezing or shortness of breath. 09/23/15  Yes Dorothea OgleMyers, Iskra M, MD  amLODipine (NORVASC) 10 MG tablet Take 1 tablet (10 mg total) by mouth daily. 03/12/16  Yes Henrietta HooverBernhardt, Linda C, NP  clonazePAM (KLONOPIN) 0.5 MG tablet Take 0.5 mg by mouth 3 (three) times  daily as needed for anxiety.   Yes [provider]  cycloSPORINE (RESTASIS) 0.05 % ophthalmic emulsion Place 1 drop into both eyes 2 (two) times daily.   Yes [provider]  furosemide (LASIX) 20 MG tablet Take 1 tablet (20 mg total) by mouth daily. 03/12/16  Yes Henrietta HooverBernhardt, Linda C, NP  gabapentin (NEURONTIN) 300 MG capsule Take 1 capsule (300 mg total) by mouth 4 (four) times daily. 03/12/16  Yes Henrietta HooverBernhardt, Linda C, NP  lamoTRIgine (LAMICTAL) 25 MG tablet Take 25 mg by mouth daily.    Yes [provider]  lithium carbonate 300 MG capsule Take 300 mg by mouth 2 (two) times daily with a meal.    Yes [provider]  metoprolol succinate (TOPROL-XL) 50 MG 24 hr tablet Take 2 tablets (100 mg total) by mouth daily. Take with or immediately following a meal. 03/12/16  Yes Henrietta HooverBernhardt, Linda C, NP  pantoprazole (PROTONIX) 40 MG tablet Take 40 mg by mouth 2 (two) times daily.   Yes [provider]  PARoxetine (PAXIL) 20 MG tablet TAKE 1 TABLET (20 MG TOTAL) BY MOUTH 1 DAY OR 1 DOSE. 04/09/16  Yes Henrietta HooverBernhardt, Linda C, NP  simvastatin (ZOCOR) 20 MG tablet TAKE ONE TABLET   (20 MG TOTAL ) BY MOUTH   AT BEDTIME 03/12/16  Yes Henrietta HooverBernhardt, Linda C, NP  ADVAIR DISKUS 500-50 MCG/DOSE AEPB INHALE 1 PUFF INTO THE LUNGS TWICE DAILY. Patient not taking: Reported on 08/16/2016 05/07/16   Henrietta HooverBernhardt, Linda C, NP  Bisacodyl (DULCOLAX PO) Take by mouth. 10mg  x 2 colonoscopy    [provider]  celecoxib (CELEBREX) 100 MG capsule TAKE 1 CAPSULE (100 MG TOTAL) BY MOUTH TWO TIMES DAILY. Patient not taking: Reported on 08/16/2016 05/07/16   Bing NeighborsHarris, Trigo Winterbottom S, FNP  celecoxib (CELEBREX) 100 MG capsule TAKE 1 CAPSULE (100 MG TOTAL) BY MOUTH TWO TIMES DAILY. Patient not taking: Reported on 08/16/2016 05/07/16   Henrietta HooverBernhardt, Linda C, NP  celecoxib (CELEBREX) 200 MG capsule Take 200 mg by mouth 2 (two) times daily.    [provider]  Fluticasone-Salmeterol (ADVAIR) 100-50 MCG/DOSE AEPB Inhale  1 puff into the lungs 2 (two) times daily.    [provider]  HYDROcodone-acetaminophen (NORCO) 5-325 MG tablet Take 1 tablet by mouth every 6 (six) hours as needed. Patient not taking: Reported on 08/16/2016 01/16/16   Janne NapoleonNeese, Hope M, NP  hydrOXYzine (VISTARIL) 25 MG capsule TAKE 1 CAPSULE (25 MG TOTAL) BY MOUTH EVERY 6 HOURS AS NEEDED FOR ANXIETY. Patient not taking: Reported on 08/16/2016 02/02/16   Henrietta HooverBernhardt, Linda C, NP  ipratropium-albuterol (DUONEB) 0.5-2.5 (3) MG/3ML SOLN Take 3 mLs by nebulization every 4 (four) hours as needed. Patient not taking: Reported on 08/16/2016 09/23/15   Dorothea OgleMyers, Iskra M, MD  PARoxetine (PAXIL) 20 MG tablet Take 20 mg by mouth daily.    [provider]  polyethylene glycol powder (MIRALAX) powder Take 1 Container by mouth  once.    [provider]  pravastatin (PRAVACHOL) 40 MG tablet Take 1 tablet (40 mg total) by mouth daily. Patient not taking: Reported on 08/16/2016 03/13/16   Henrietta Hoover, NP  PROVENTIL HFA 108 [phone] Base) MCG/ACT inhaler INHALE TWO   PUFFS INTO THE LUNGS EVERY 6 HOURS AS NEEDED FOR WHEEZING. 05/01/16   Massie Maroon, FNP  PROVENTIL HFA 108 [phone] Base) MCG/ACT inhaler INHALE TWO   PUFFS INTO THE LUNGS EVERY 6 HOURS AS NEEDED FOR WHEEZING. 05/07/16   Bing Neighbors, FNP    Past Medical, Surgical Family and Social History reviewed and updated.    Objective:   Today's Vitals   08/16/16 1309  BP: 136/74  Pulse: (!) 58  Resp: 16  Temp: 98.1 F (36.7 C)  TempSrc: Oral  SpO2: 97%  Weight: 254 lb (115.2 kg)  Height: 5\' 3"  (1.6 m)    Wt Readings from Last 3 Encounters:  08/16/16 254 lb (115.2 kg)  03/22/16 253 lb (114.8 kg)  03/12/16 254 lb 12.8 oz (115.6 kg)   Physical Exam  Constitutional: She is oriented to person, place, and time. She appears well-developed and well-nourished.  HENT:  Head: Normocephalic and atraumatic.  Left Ear: Hearing normal.  Nose: Nose normal.  Eyes: Pupils are equal,  round, and reactive to light. Conjunctivae are normal.  Neck: Normal range of motion. Neck supple.  Cardiovascular: Normal rate, regular rhythm, normal heart sounds and intact distal pulses.   Pulmonary/Chest: Effort normal. She has decreased breath sounds in the right upper field, the right middle field, the right lower field, the left upper field, the left middle field and the left lower field. She has no wheezes. She has no rales. She exhibits no tenderness.  Dry hacking nonproductive cough.  Musculoskeletal: Normal range of motion.  Neurological: She is alert and oriented to person, place, and time.  Skin: Skin is warm and dry.  Psychiatric: She has a normal mood and affect. Her behavior is normal. Judgment and thought content normal.    Assessment & Plan:  1. Elevated hemoglobin A1c - POCT glycosylated hemoglobin (Hb A1C)-6.0 (prediabetes)   2. Viral upper respiratory tract infection, treat symptomatically  -Hydcodan cough syrup, 5 ml every 8 hours as needed for cough -Continue Albuterol 2 puffs, every 4 hours as needed for wheezing or shortness of breath.  3. Contact dermatitis, unspecified contact dermatitis type, unspecified trigger -Apply triamcinolone ointment to bilateral arms, 2 times daily.  4. Candida Kathleen Herrera of groin -Apply Lotrisone cream directly to Kathleen Herrera 2 times daily -The Kathleen Herrera prevention apply nystatin powder between thighs and to groin area  5. Stress Incontinence  -Oxybutynin 5 mg daily at bedtime.  RTC: Return for fasting labs in 1-2 weeks and return for follow-up in 6 months  Kathleen Herrera. Kathleen Pea, MSN, FNP-C [facility]  [address] [place], [place] [address] [phone]

## 2016-08-27 ENCOUNTER — Telehealth: Payer: Self-pay

## 2016-08-27 DIAGNOSIS — R600 Localized edema: Secondary | ICD-10-CM

## 2016-08-27 DIAGNOSIS — I10 Essential (primary) hypertension: Secondary | ICD-10-CM

## 2016-08-27 MED ORDER — FUROSEMIDE 20 MG PO TABS: 20.0000 mg | ORAL_TABLET | Freq: Every day | ORAL | 3 refills | Status: DC

## 2016-08-27 NOTE — Telephone Encounter (Signed)
Medication sent to pharmacy  

## 2016-08-28 ENCOUNTER — Other Ambulatory Visit: Payer: Self-pay | Admitting: Family Medicine

## 2016-08-28 ENCOUNTER — Telehealth: Payer: Self-pay

## 2016-08-28 MED ORDER — BENZONATATE 100 MG PO CAPS: 100.0000 mg | ORAL_CAPSULE | Freq: Three times a day (TID) | ORAL | 0 refills | Status: DC | PRN

## 2016-08-28 NOTE — Progress Notes (Signed)
I have ordered Tessalon pearls for day time cough

## 2016-08-28 NOTE — Telephone Encounter (Signed)
Patient would like something else sent in for the cough. Selena BattenKim will be sending in Tessalon pearls

## 2016-08-29 ENCOUNTER — Telehealth: Payer: Self-pay

## 2016-08-29 ENCOUNTER — Other Ambulatory Visit: Payer: Self-pay | Admitting: Family Medicine

## 2016-08-29 MED ORDER — HYDROCODONE-HOMATROPINE 5-1.5 MG/5ML PO SYRP: 5.0000 mL | ORAL_SOLUTION | Freq: Three times a day (TID) | ORAL | 0 refills | Status: DC | PRN

## 2016-08-29 NOTE — Progress Notes (Unsigned)
Contact patient to advise the other alternative to Tessalon  is over the counter Delsym every 12 hours in addition to taking the hycodan at bedtime. Delsym will have to be pain for out of pocket. Please reinforces that she should continue using her albuterol every 4-6 hours as needed for shortness or breath, wheezing, or chest tightness. If symptoms, do not improve with this regimen, she will need to return for care. Please advise patient I will only refill hycodan syrup once more as typically do not refill cough syrup with codeine. If her cough continues after this refill, she will have to be seen in office. She will need to come into office to pick up script as it cannot be faxed.  Godfrey PickKimberly S. Tiburcio PeaHarris, MSN, FNP-C [facility]  [address] [place], [place] [address] [phone]

## 2016-08-29 NOTE — Progress Notes (Signed)
Patient needs some more of the Hycodan syrup. She finished the bottle last night

## 2016-08-29 NOTE — Progress Notes (Signed)
Patient notified

## 2016-08-31 ENCOUNTER — Other Ambulatory Visit: Payer: Medicaid Other

## 2016-09-05 ENCOUNTER — Other Ambulatory Visit (INDEPENDENT_AMBULATORY_CARE_PROVIDER_SITE_OTHER): Payer: Medicaid Other

## 2016-09-05 DIAGNOSIS — Z13220 Encounter for screening for lipoid disorders: Secondary | ICD-10-CM

## 2016-09-05 DIAGNOSIS — J069 Acute upper respiratory infection, unspecified: Secondary | ICD-10-CM

## 2016-09-05 DIAGNOSIS — R7309 Other abnormal glucose: Secondary | ICD-10-CM

## 2016-09-05 LAB — CBC WITH DIFFERENTIAL/PLATELET
Basophils Absolute: 0 cells/uL (ref 0–200)
Basophils Relative: 0 %
EOS ABS: 162 {cells}/uL (ref 15–500)
Eosinophils Relative: 2 %
HCT: 43.2 % (ref 35.0–45.0)
Hemoglobin: 13.6 g/dL (ref 11.7–15.5)
LYMPHS PCT: 34 %
Lymphs Abs: 2754 cells/uL (ref 850–3900)
MCH: 26.6 pg — ABNORMAL LOW (ref 27.0–33.0)
MCHC: 31.5 g/dL — AB (ref 32.0–36.0)
MCV: 84.4 fL (ref 80.0–100.0)
MONOS PCT: 5 %
MPV: 10.3 fL (ref 7.5–12.5)
Monocytes Absolute: 405 cells/uL (ref 200–950)
NEUTROS PCT: 59 %
Neutro Abs: 4779 cells/uL (ref 1500–7800)
PLATELETS: 321 10*3/uL (ref 140–400)
RBC: 5.12 MIL/uL — AB (ref 3.80–5.10)
RDW: 15.6 % — AB (ref 11.0–15.0)
WBC: 8.1 10*3/uL (ref 3.8–10.8)

## 2016-09-06 LAB — LIPID PANEL
CHOL/HDL RATIO: 2.8 ratio (ref <5.0)
Cholesterol: 201 mg/dL — ABNORMAL HIGH (ref <200)
HDL: 72 mg/dL (ref >50)
LDL Cholesterol: 95 mg/dL (ref <100)
TRIGLYCERIDES: 169 mg/dL — AB (ref <150)
VLDL: 34 mg/dL — ABNORMAL HIGH (ref <30)

## 2016-09-06 LAB — COMPLETE METABOLIC PANEL WITH GFR
ALT: 12 U/L (ref 6–29)
AST: 16 U/L (ref 10–35)
Albumin: 4.2 g/dL (ref 3.6–5.1)
Alkaline Phosphatase: 76 U/L (ref 33–130)
BUN: 12 mg/dL (ref 7–25)
CHLORIDE: 102 mmol/L (ref 98–110)
CO2: 22 mmol/L (ref 20–31)
CREATININE: 0.89 mg/dL (ref 0.50–1.05)
Calcium: 9.3 mg/dL (ref 8.6–10.4)
GFR, Est African American: 83 mL/min (ref >=60)
GFR, Est Non African American: 72 mL/min (ref >=60)
Glucose, Bld: 128 mg/dL — ABNORMAL HIGH (ref 65–99)
POTASSIUM: 4.2 mmol/L (ref 3.5–5.3)
Sodium: 140 mmol/L (ref 135–146)
Total Bilirubin: 0.4 mg/dL (ref 0.2–1.2)
Total Protein: 7.1 g/dL (ref 6.1–8.1)

## 2016-09-07 ENCOUNTER — Other Ambulatory Visit: Payer: Self-pay | Admitting: Family Medicine

## 2016-09-07 MED ORDER — SOLIFENACIN SUCCINATE 5 MG PO TABS: 5.0000 mg | ORAL_TABLET | Freq: Every day | ORAL | 3 refills | Status: DC

## 2016-09-07 NOTE — Progress Notes (Signed)
Patient notified

## 2016-09-07 NOTE — Progress Notes (Signed)
I have prescribed Vesicare per  medicaid preferred drug list for management of  patient's incontinence symptoms. Please contact patient to make her aware .  Godfrey PickKimberly S. Tiburcio PeaHarris, MSN, FNP-C [facility]  [address] [place], [place] [address] [phone]

## 2016-09-21 ENCOUNTER — Other Ambulatory Visit: Payer: Self-pay

## 2016-09-29 ENCOUNTER — Emergency Department (HOSPITAL_COMMUNITY): Payer: Medicaid Other

## 2016-09-29 ENCOUNTER — Encounter (HOSPITAL_COMMUNITY): Payer: Self-pay | Admitting: Emergency Medicine

## 2016-09-29 ENCOUNTER — Emergency Department (HOSPITAL_COMMUNITY)
Admission: EM | Admit: 2016-09-29 | Discharge: 2016-09-29 | Disposition: A | Discharge status: Home / Self Care | Payer: Medicaid Other | Attending: Emergency Medicine | Admitting: Emergency Medicine

## 2016-09-29 DIAGNOSIS — J45909 Unspecified asthma, uncomplicated: Secondary | ICD-10-CM | POA: Diagnosis not present

## 2016-09-29 DIAGNOSIS — R05 Cough: Secondary | ICD-10-CM | POA: Diagnosis present

## 2016-09-29 DIAGNOSIS — I10 Essential (primary) hypertension: Secondary | ICD-10-CM | POA: Insufficient documentation

## 2016-09-29 DIAGNOSIS — L259 Unspecified contact dermatitis, unspecified cause: Secondary | ICD-10-CM

## 2016-09-29 DIAGNOSIS — Z79899 Other long term (current) drug therapy: Secondary | ICD-10-CM | POA: Diagnosis not present

## 2016-09-29 DIAGNOSIS — R059 Cough, unspecified: Secondary | ICD-10-CM

## 2016-09-29 DIAGNOSIS — F17210 Nicotine dependence, cigarettes, uncomplicated: Secondary | ICD-10-CM | POA: Insufficient documentation

## 2016-09-29 MED ORDER — PREDNISONE 10 MG PO TABS: 20.0000 mg | ORAL_TABLET | Freq: Every day | ORAL | 0 refills | Status: DC

## 2016-09-29 MED ORDER — ALBUTEROL SULFATE (2.5 MG/3ML) 0.083% IN NEBU: 2.5000 mg | INHALATION_SOLUTION | Freq: Once | RESPIRATORY_TRACT | Status: DC

## 2016-09-29 MED ORDER — HYDROCORTISONE 1 % EX CREA: TOPICAL_CREAM | CUTANEOUS | 0 refills | Status: DC

## 2016-09-29 MED ORDER — BENZONATATE 100 MG PO CAPS: 100.0000 mg | ORAL_CAPSULE | Freq: Three times a day (TID) | ORAL | 0 refills | Status: DC

## 2016-09-29 NOTE — ED Triage Notes (Signed)
Pt reports a productive cough with green sputum for the past 3 weeks. Pt has mold in her house, and thinks it may be related to that. Pt reports she has urinary leakage with coughing.

## 2016-09-29 NOTE — Discharge Instructions (Signed)
You can take Tylenol as directed for pain.  Use the cream as directed for the rash.  Take prednisone as directed.  Use the [employer] as directed.  Follow-up with your primary care doctor next 24-48 hours further evaluation.  Return the emergency Department for any worsening cough, fever, difficulty breathing, chest pain, worsening rash or any other worsening or concerning symptoms.

## 2016-09-29 NOTE — ED Provider Notes (Signed)
[facility]-EMERGENCY DEPT Provider Note   CSN: [id number] Arrival date & time: 09/29/16  1527     History   Chief Complaint Chief Complaint  Patient presents with  . Cough    HPI Kathleen Herrera is a 56 y.o. female who presents with 3 weeks of persistent cough. Patient states that cough is productive sometimes clear and sometimes with green sputum. Patient denies any alleviating or aggravating factors. She states that cough is unaffected by laying down or changing positions. She did call her primary care doctor at the onset of symptoms and was prescribed codeine cough syrup. She reports that codeine cough syrup temporarily improved symptoms but returned. Patient states that she she recently found out her residence of one year has mold in it and is concerned it may be due to that. Patient does have a history of asthma but states that she has not had to increase her rescue inhaler use. Patient denies any fevers, chills, chest pain, difficulty breathing, abdominal pain, nausea/vomiting. Patient is a current smoker and smokes approximately 2 cigarettes a day.  Patient also reports persistent rash to left forearm. Patient states this is been ongoing for a week. Patient states that her primary care doctor diagnosed her with contact dermatitis but did not prescribe her anything. She reports that she is having associated itching. No other rash. No new exposures.  The history is provided by the patient.    Past Medical History:  Diagnosis Date  . Arthritis   . Bronchitis   . Depression   . Fibromyalgia Y-2  . Fibromyalgia unknown  . Hyperlipemia   . Hypertension   . Post traumatic stress disorder   . Sinus congestion     Patient Active Problem List   Diagnosis Date Noted  . Asthma   . CAP (community acquired pneumonia) 09/19/2015  . Dyspnea 09/19/2015  . Acute respiratory failure with hypoxia (HCC) 09/19/2015  . Abnormality of gait 06/29/2014  . Essential hypertension 06/29/2014  .  Hyperlipidemia 06/29/2014  . Metabolic syndrome 06/29/2014  . Neuropathic pain 06/29/2014  . Gastroesophageal reflux disease without esophagitis 06/29/2014  . Tachycardia 06/29/2014  . At risk for osteopenia 06/29/2014  . Excessive daytime sleepiness 06/29/2014  . Fibromyalgia   . Abnormal LFTs 03/06/2013  . Extreme obesity 12/18/2012  . Fatty infiltration of liver 07/15/2012  . Gonalgia 07/15/2012  . LBP (low back pain) 07/15/2012  . Arthritis, degenerative 06/12/2012  . Arthritis 05/26/2012  . Bipolar 2 disorder (HCC) 04/17/2012  . Clinical depression 04/17/2012    Past Surgical History:  Procedure Laterality Date  . ABDOMINAL HYSTERECTOMY      OB History    No data available       Home Medications    Prior to Admission medications   Medication Sig Start Date End Date Taking? Authorizing Provider  albuterol (PROVENTIL HFA;VENTOLIN HFA) 108 (90 Base) MCG/ACT inhaler Inhale 2 puffs into the lungs every 6 (six) hours as needed for wheezing or shortness of breath. 09/23/15   Dorothea Ogle, MD  amLODipine (NORVASC) 10 MG tablet Take 1 tablet (10 mg total) by mouth daily. 03/12/16   Henrietta Hoover, NP  benzonatate (TESSALON) 100 MG capsule Take 1 capsule (100 mg total) by mouth every 8 (eight) hours. 09/29/16   Maxwell Caul, PA-C  Bisacodyl (DULCOLAX PO) Take by mouth. 10mg  x 2 colonoscopy    [provider]  budesonide-formoterol (SYMBICORT) 80-4.5 MCG/ACT inhaler Inhale 2 puffs into the lungs 2 (two) times daily. 08/16/16  Bing Neighbors, FNP  celecoxib (CELEBREX) 100 MG capsule TAKE 1 CAPSULE (100 MG TOTAL) BY MOUTH TWO TIMES DAILY. Patient not taking: Reported on 08/16/2016 05/07/16   Bing Neighbors, FNP  celecoxib (CELEBREX) 100 MG capsule TAKE 1 CAPSULE (100 MG TOTAL) BY MOUTH TWO TIMES DAILY. Patient not taking: Reported on 08/16/2016 05/07/16   Henrietta Hoover, NP  celecoxib (CELEBREX) 200 MG capsule Take 200 mg by mouth 2 (two) times daily.     [provider]  clonazePAM (KLONOPIN) 0.5 MG tablet Take 0.5 mg by mouth 3 (three) times daily as needed for anxiety.    [provider]  clotrimazole-betamethasone (LOTRISONE) cream Apply 1 application topically 2 (two) times daily. Groin area. 08/16/16   Bing Neighbors, FNP  cycloSPORINE (RESTASIS) 0.05 % ophthalmic emulsion Place 1 drop into both eyes 2 (two) times daily.    [provider]  Fluticasone-Salmeterol (ADVAIR) 100-50 MCG/DOSE AEPB Inhale 1 puff into the lungs 2 (two) times daily.    [provider]  furosemide (LASIX) 20 MG tablet Take 1 tablet (20 mg total) by mouth daily. 08/27/16   Bing Neighbors, FNP  gabapentin (NEURONTIN) 300 MG capsule Take 1 capsule (300 mg total) by mouth 4 (four) times daily. 03/12/16   Henrietta Hoover, NP  HYDROcodone-acetaminophen (NORCO) 5-325 MG tablet Take 1 tablet by mouth every 6 (six) hours as needed. Patient not taking: Reported on 08/16/2016 01/16/16   Janne Napoleon, NP  HYDROcodone-homatropine [facility]) 5-1.5 MG/5ML syrup Take 5 mLs by mouth every 8 (eight) hours as needed for cough. 08/29/16   Bing Neighbors, FNP  hydrocortisone cream 1 % Apply to affected area 2 times daily 09/29/16   Graciella Freer A, PA-C  hydrOXYzine (VISTARIL) 25 MG capsule TAKE 1 CAPSULE (25 MG TOTAL) BY MOUTH EVERY 6 HOURS AS NEEDED FOR ANXIETY. Patient not taking: Reported on 08/16/2016 02/02/16   Henrietta Hoover, NP  ipratropium-albuterol (DUONEB) 0.5-2.5 (3) MG/3ML SOLN Take 3 mLs by nebulization every 4 (four) hours as needed. Patient not taking: Reported on 08/16/2016 09/23/15   Dorothea Ogle, MD  lamoTRIgine (LAMICTAL) 25 MG tablet Take 25 mg by mouth daily.     [provider]  lithium carbonate 300 MG capsule Take 300 mg by mouth 2 (two) times daily with a meal.     [provider]  metoprolol succinate (TOPROL-XL) 50 MG 24 hr tablet Take 2 tablets (100 mg total) by mouth daily. Take with or  immediately following a meal. 03/12/16   Henrietta Hoover, NP  Nystatin POWD Apply 2 sprinkles of powder to the affected inner thighs to prevent itching and fungal rash 08/16/16   Bing Neighbors, FNP  oxybutynin (DITROPAN XL) 5 MG 24 hr tablet Take 1 tablet (5 mg total) by mouth at bedtime. 08/16/16   Bing Neighbors, FNP  pantoprazole (PROTONIX) 40 MG tablet Take 40 mg by mouth 2 (two) times daily.    [provider]  PARoxetine (PAXIL) 20 MG tablet Take 20 mg by mouth daily.    [provider]  PARoxetine (PAXIL) 20 MG tablet TAKE 1 TABLET (20 MG TOTAL) BY MOUTH 1 DAY OR 1 DOSE. 04/09/16   Henrietta Hoover, NP  polyethylene glycol powder (MIRALAX) powder Take 1 Container by mouth once.    [provider]  pravastatin (PRAVACHOL) 40 MG tablet Take 1 tablet (40 mg total) by mouth daily. Patient not taking: Reported on 08/16/2016 03/13/16  Henrietta Hoover, NP  predniSONE (DELTASONE) 10 MG tablet Take 2 tablets (20 mg total) by mouth daily. 09/29/16   Maxwell Caul, PA-C  PROVENTIL HFA 108 (90 Base) MCG/ACT inhaler INHALE TWO   PUFFS INTO THE LUNGS EVERY 6 HOURS AS NEEDED FOR WHEEZING. 05/01/16   Massie Maroon, FNP  PROVENTIL HFA 108 [phone] Base) MCG/ACT inhaler INHALE TWO   PUFFS INTO THE LUNGS EVERY 6 HOURS AS NEEDED FOR WHEEZING. 05/07/16   Bing Neighbors, FNP  simvastatin (ZOCOR) 20 MG tablet TAKE ONE TABLET   (20 MG TOTAL ) BY MOUTH   AT BEDTIME 03/12/16   Henrietta Hoover, NP  solifenacin (VESICARE) 5 MG tablet Take 1 tablet (5 mg total) by mouth daily. 09/07/16 09/07/17  Bing Neighbors, FNP  triamcinolone cream (KENALOG) 0.1 % Apply 1 application topically 2 (two) times daily. Bilateral arms. 08/16/16   Bing Neighbors, FNP    Family History Family History  Problem Relation Age of Onset  . Dementia Mother   . Prostate cancer Father   . Dementia Father   . Prostate cancer Other   . CAD Other     Social History Social History  Substance Use  Topics  . Smoking status: Current Every Day Smoker    Packs/day: 0.25    Years: 37.00    Types: Cigarettes  . Smokeless tobacco: Never Used  . Alcohol use Yes     Comment: occasionally     Allergies   Patient has no known allergies.   Review of Systems Review of Systems  Constitutional: Negative for fever.  Respiratory: Positive for cough. Negative for shortness of breath.   Cardiovascular: Negative for chest pain.  Gastrointestinal: Negative for abdominal pain, nausea and vomiting.  Skin: Positive for rash.     Physical Exam Updated Vital Signs BP (!) 144/86 (BP Location: Left Arm)   Pulse (!) 55   Temp 98.4 F (36.9 C) (Oral)   Resp 16   SpO2 99%   Physical Exam  Constitutional: She appears well-developed and well-nourished.  Sitting comfortably on examination table  HENT:  Head: Normocephalic and atraumatic.  Eyes: Conjunctivae and EOM are normal. Right eye exhibits no discharge. Left eye exhibits no discharge. No scleral icterus.  Cardiovascular: Normal rate and regular rhythm.   Pulmonary/Chest: Effort normal and breath sounds normal. She has no decreased breath sounds. She has no wheezes. She has no rhonchi.  No evidence of respiratory distress. Able to speak in full sentences without difficulty.  Neurological: She is alert.  Skin: Skin is warm and dry. Capillary refill takes less than 2 seconds.  Diffuse erythematous, maculopapular rash to the distal forearm. No rash noted to palms. No other rash noted.  Psychiatric: She has a normal mood and affect. Her speech is normal and behavior is normal.  Nursing note and vitals reviewed.    ED Treatments / Results  Labs (all labs ordered are listed, but only abnormal results are displayed) Labs Reviewed - No data to display  EKG  EKG Interpretation None       Radiology Dg Chest 2 View  Result Date: 09/29/2016 CLINICAL DATA:  Cough, shortness of breath, body aches EXAM: CHEST  2 VIEW COMPARISON:   09/19/2015 FINDINGS: Lungs are clear.  No pleural effusion or pneumothorax. The heart is normal in size. Mild degenerative changes of the visualized thoracolumbar spine. IMPRESSION: No evidence of acute cardiopulmonary disease. Electronically Signed   By: Roselie Awkward.D.  On: 09/29/2016 17:34    Procedures Procedures (including critical care time)  Medications Ordered in ED Medications - No data to display   Initial Impression / Assessment and Plan / ED Course  I have reviewed the triage vital signs and the nursing notes.  Pertinent labs & imaging results that were available during my care of the patient were reviewed by me and considered in my medical decision making (see chart for details).     56 year old female with past medical history of acid presents with 3 weeks of persistent cough. Cough is productive with green sputum. No fevers/chills, chest pain, difficulty breathing. Patient is afebrile, non-toxic appearing, sitting comfortably on examination table. Vital signs reviewed and stable. Patient O2 sats 97% on room air. No evidence of respiratory distress. Lung sounds clear to auscultation. No evidence of wheezing or rales. Consider upper respiratory infection versus pneumonia versus bronchitis versus asthma exacerbation. Explained that irritants in the air at patient's home, could be exacerbating her asthma. Though she declines any increased use of her rescue inhaler. Rash is consistent with contact dermatitis. Will plan to get a chest x-ray for evaluation. Offered patient an albuterol treatment in the department help with symptoms, but she declines at this time. Patient states she has a nebulizer machine at home and will use usage.  X-rays reviewed. Negative for any acute infectious etiology. Discussed results with patient. Will plan to treat her cough symptomatically. Will put on a short course of prednisone to help with symptoms. We will also give patient hydrocortisone cream to  help with contact dermatitis. Patient instructed follow-up with her primary care doctor in the next 24-48 hours for further evaluation. Strict return precautions discussed. Patient expresses understanding and agreement to plan.    Final Clinical Impressions(s) / ED Diagnoses   Final diagnoses:  Cough  Contact dermatitis, unspecified contact dermatitis type, unspecified trigger    New Prescriptions Discharge Medication List as of 09/29/2016  6:19 PM    START taking these medications   Details  benzonatate (TESSALON) 100 MG capsule Take 1 capsule (100 mg total) by mouth every 8 (eight) hours., Starting Sat 09/29/2016, Print    hydrocortisone cream 1 % Apply to affected area 2 times daily, Print    predniSONE (DELTASONE) 10 MG tablet Take 2 tablets (20 mg total) by mouth daily., Starting Sat 09/29/2016, Print         Maxwell Caul, PA-C 09/30/16 0150    Nira Conn, MD 09/30/16 [phone]

## 2016-10-09 ENCOUNTER — Telehealth: Payer: Self-pay

## 2016-10-09 MED ORDER — TRIAMCINOLONE ACETONIDE 0.1 % EX CREA: 1.0000 "application " | TOPICAL_CREAM | Freq: Two times a day (BID) | CUTANEOUS | 0 refills | Status: DC

## 2016-10-09 NOTE — Telephone Encounter (Signed)
Medication was sent

## 2016-10-12 ENCOUNTER — Emergency Department (HOSPITAL_COMMUNITY)
Admission: EM | Admit: 2016-10-12 | Discharge: 2016-10-12 | Disposition: A | Discharge status: Home / Self Care | Payer: Medicaid Other | Attending: Emergency Medicine | Admitting: Emergency Medicine

## 2016-10-12 ENCOUNTER — Telehealth: Payer: Self-pay | Admitting: Family Medicine

## 2016-10-12 ENCOUNTER — Encounter (HOSPITAL_COMMUNITY): Payer: Self-pay

## 2016-10-12 DIAGNOSIS — B3741 Candidal cystitis and urethritis: Secondary | ICD-10-CM | POA: Insufficient documentation

## 2016-10-12 DIAGNOSIS — N3001 Acute cystitis with hematuria: Secondary | ICD-10-CM | POA: Insufficient documentation

## 2016-10-12 DIAGNOSIS — M545 Low back pain, unspecified: Secondary | ICD-10-CM

## 2016-10-12 DIAGNOSIS — I10 Essential (primary) hypertension: Secondary | ICD-10-CM | POA: Insufficient documentation

## 2016-10-12 DIAGNOSIS — R300 Dysuria: Secondary | ICD-10-CM | POA: Diagnosis present

## 2016-10-12 DIAGNOSIS — Z79899 Other long term (current) drug therapy: Secondary | ICD-10-CM | POA: Insufficient documentation

## 2016-10-12 DIAGNOSIS — J45909 Unspecified asthma, uncomplicated: Secondary | ICD-10-CM | POA: Insufficient documentation

## 2016-10-12 DIAGNOSIS — F17210 Nicotine dependence, cigarettes, uncomplicated: Secondary | ICD-10-CM | POA: Insufficient documentation

## 2016-10-12 LAB — URINALYSIS, ROUTINE W REFLEX MICROSCOPIC
BILIRUBIN URINE: NEGATIVE
Glucose, UA: NEGATIVE mg/dL
Ketones, ur: NEGATIVE mg/dL
NITRITE: NEGATIVE
PH: 5 (ref 5.0–8.0)
Protein, ur: 100 mg/dL — AB
SPECIFIC GRAVITY, URINE: 1.014 (ref 1.005–1.030)

## 2016-10-12 MED ORDER — FLUCONAZOLE 200 MG PO TABS: 200.0000 mg | ORAL_TABLET | Freq: Every day | ORAL | 0 refills | Status: AC

## 2016-10-12 MED ORDER — CEPHALEXIN 500 MG PO CAPS: 500.0000 mg | ORAL_CAPSULE | Freq: Three times a day (TID) | ORAL | 0 refills | Status: DC

## 2016-10-12 MED ORDER — CYCLOBENZAPRINE HCL 10 MG PO TABS: 10.0000 mg | ORAL_TABLET | Freq: Three times a day (TID) | ORAL | 0 refills | Status: DC | PRN

## 2016-10-12 NOTE — ED Triage Notes (Signed)
Patient c/o bilateral lower back pain that radiates down both legs x 4 days. Patient also c/o dysuria and hematuria since yesterday.

## 2016-10-12 NOTE — Telephone Encounter (Signed)
Patient can keep 10/16/2016 appointment, however if symptoms worsen, she will need to go to urgent care for further evaluation .  Godfrey PickKimberly S. Tiburcio PeaHarris, MSN, FNP-C [facility]  [address] [place], [place] [address] [phone]

## 2016-10-12 NOTE — Telephone Encounter (Signed)
Patient called c/o lower back pain shooting down her legs, pain in her stomach and blood in her urine along with urinary urgency and pain. She has an appt on Tuesday 10/16/16, but was wondering if something could be called in or what she should do. Please advise.

## 2016-10-12 NOTE — ED Provider Notes (Signed)
[facility]ER[facility]Y DEPT Provider Note   CSN: [id number] Arrival date & time: 10/12/16  1048     History   Chief Complaint Chief Complaint  Patient presents with  . Back Pain  . Dysuria  . Hematuria    HPI Kathleen Herrera is a 56 y.o. female who presents with low back pain and dysuria. PMH significant for obesity, fibromyalgia, chronic low back pain, arthritis, HLD, HTN. She states that she has had low back pain, painful urination, and hematuria for the past week. The back pain is across her lower back and worse in the middle. It radiates to the RLQ and LLQ bilaterally. It also radiates to the left and right thighs. She denies numbness or weakness in the legs. She has urinary incontinence but this is due to stress incontinence and is on medication for this. She denies urinary retention or bowel incontinence. She previously was on Flexeril for her chronic back pain but has been out of this for several months. She reports a possible fever at one point this week and sweats as well as some nausea which has resolved. No chest pain, SOB, vomiting, diarrhea, vaginal discharge.  HPI  Past Medical History:  Diagnosis Date  . Arthritis   . Bronchitis   . Depression   . Fibromyalgia Y-2  . Fibromyalgia unknown  . Hyperlipemia   . Hypertension   . Post traumatic stress disorder   . Sinus congestion     Patient Active Problem List   Diagnosis Date Noted  . Asthma   . CAP (community acquired pneumonia) 09/19/2015  . Dyspnea 09/19/2015  . Acute respiratory failure with hypoxia (HCC) 09/19/2015  . Abnormality of gait 06/29/2014  . Essential hypertension 06/29/2014  . Hyperlipidemia 06/29/2014  . Metabolic syndrome 06/29/2014  . Neuropathic pain 06/29/2014  . Gastroesophageal reflux disease without esophagitis 06/29/2014  . Tachycardia 06/29/2014  . At risk for osteopenia 06/29/2014  . Excessive daytime sleepiness 06/29/2014  . Fibromyalgia   . Abnormal LFTs 03/06/2013  . Extreme  obesity 12/18/2012  . Fatty infiltration of liver 07/15/2012  . Gonalgia 07/15/2012  . LBP (low back pain) 07/15/2012  . Arthritis, degenerative 06/12/2012  . Arthritis 05/26/2012  . Bipolar 2 disorder (HCC) 04/17/2012  . Clinical depression 04/17/2012    Past Surgical History:  Procedure Laterality Date  . ABDOMINAL HYSTERECTOMY      OB History    No data available       Home Medications    Prior to Admission medications   Medication Sig Start Date End Date Taking? Authorizing Provider  albuterol (PROVENTIL HFA;VENTOLIN HFA) 108 (90 Base) MCG/ACT inhaler Inhale 2 puffs into the lungs every 6 (six) hours as needed for wheezing or shortness of breath. 09/23/15  Yes Dorothea Ogle, MD  amLODipine (NORVASC) 10 MG tablet Take 1 tablet (10 mg total) by mouth daily. 03/12/16  Yes Henrietta Hoover, NP  budesonide-formoterol (SYMBICORT) 80-4.5 MCG/ACT inhaler Inhale 2 puffs into the lungs 2 (two) times daily. 08/16/16  Yes Bing Neighbors, FNP  clonazePAM (KLONOPIN) 0.5 MG tablet Take 0.5 mg by mouth 3 (three) times daily as needed for anxiety.   Yes [provider]  clotrimazole-betamethasone (LOTRISONE) cream Apply 1 application topically 2 (two) times daily. Groin area. 08/16/16  Yes Bing Neighbors, FNP  furosemide (LASIX) 20 MG tablet Take 1 tablet (20 mg total) by mouth daily. 08/27/16  Yes Bing Neighbors, FNP  lamoTRIgine (LAMICTAL) 25 MG tablet Take 25 mg by mouth daily.  Yes [provider]  lithium carbonate 300 MG capsule Take 300 mg by mouth 2 (two) times daily with a meal.    Yes [provider]  metoprolol succinate (TOPROL-XL) 50 MG 24 hr tablet Take 2 tablets (100 mg total) by mouth daily. Take with or immediately following a meal. 03/12/16  Yes Henrietta Hoover, NP  Nystatin POWD Apply 2 sprinkles of powder to the affected inner thighs to prevent itching and fungal rash 08/16/16  Yes Bing Neighbors, FNP  oxybutynin (DITROPAN XL) 5 MG  24 hr tablet Take 1 tablet (5 mg total) by mouth at bedtime. 08/16/16  Yes Bing Neighbors, FNP  pantoprazole (PROTONIX) 40 MG tablet Take 40 mg by mouth 2 (two) times daily.   Yes [provider]  PARoxetine (PAXIL) 20 MG tablet TAKE 1 TABLET (20 MG TOTAL) BY MOUTH 1 DAY OR 1 DOSE. Patient taking differently: TAKE 1 TABLET (20 MG TOTAL) BY MOUTH DAILY 04/09/16  Yes Henrietta Hoover, NP  simvastatin (ZOCOR) 20 MG tablet TAKE ONE TABLET   (20 MG TOTAL ) BY MOUTH   AT BEDTIME 03/12/16  Yes Henrietta Hoover, NP  solifenacin (VESICARE) 5 MG tablet Take 1 tablet (5 mg total) by mouth daily. 09/07/16 09/07/17 Yes Bing Neighbors, FNP  triamcinolone cream (KENALOG) 0.1 % Apply 1 application topically 2 (two) times daily. Bilateral arms. 10/09/16  Yes Bing Neighbors, FNP  hydrOXYzine (VISTARIL) 25 MG capsule TAKE 1 CAPSULE (25 MG TOTAL) BY MOUTH EVERY 6 HOURS AS NEEDED FOR ANXIETY. Patient not taking: Reported on 08/16/2016 02/02/16   Henrietta Hoover, NP  ipratropium-albuterol (DUONEB) 0.5-2.5 (3) MG/3ML SOLN Take 3 mLs by nebulization every 4 (four) hours as needed. Patient not taking: Reported on 08/16/2016 09/23/15   Dorothea Ogle, MD  PROVENTIL HFA 108 [phone] Base) MCG/ACT inhaler INHALE TWO   PUFFS INTO THE LUNGS EVERY 6 HOURS AS NEEDED FOR WHEEZING. Patient not taking: Reported on 10/12/2016 05/01/16   Massie Maroon, FNP  PROVENTIL HFA 108 [phone] Base) MCG/ACT inhaler INHALE TWO   PUFFS INTO THE LUNGS EVERY 6 HOURS AS NEEDED FOR WHEEZING. Patient not taking: Reported on 10/12/2016 05/07/16   Bing Neighbors, FNP    Family History Family History  Problem Relation Age of Onset  . Dementia Mother   . Prostate cancer Father   . Dementia Father   . Prostate cancer Other   . CAD Other     Social History Social History  Substance Use Topics  . Smoking status: Current Every Day Smoker    Packs/day: 0.25    Years: 37.00    Types: Cigarettes  . Smokeless tobacco: Never Used  . Alcohol  use Yes     Comment: occasionally     Allergies   Patient has no known allergies.   Review of Systems Review of Systems  Constitutional: Positive for diaphoresis and fever.  Respiratory: Negative for shortness of breath.   Cardiovascular: Negative for chest pain.  Gastrointestinal: Positive for abdominal pain and nausea. Negative for diarrhea and vomiting.  Genitourinary: Positive for dysuria and hematuria. Negative for flank pain, pelvic pain and vaginal discharge.  Musculoskeletal: Positive for back pain.  All other systems reviewed and are negative.    Physical Exam Updated Vital Signs BP 139/78 (BP Location: Right Arm)   Pulse (!) 51   Temp 98.6 F (37 C) (Oral)   Resp 16   Ht  (1.6 m)   Wt  113.4 kg (250 lb)   SpO2 92%   BMI 44.29 kg/m   Physical Exam  Constitutional: She is oriented to person, place, and time. She appears well-developed and well-nourished. No distress.  Obese, calm, cooperative  HENT:  Head: Normocephalic and atraumatic.  Eyes: Pupils are equal, round, and reactive to light. Conjunctivae are normal. Right eye exhibits no discharge. Left eye exhibits no discharge. No scleral icterus.  Neck: Normal range of motion.  Cardiovascular: Normal rate and regular rhythm.  Exam reveals no gallop and no friction rub.   No murmur heard. Pulmonary/Chest: Effort normal and breath sounds normal. No respiratory distress. She has no wheezes. She has no rales. She exhibits no tenderness.  Abdominal: Soft. Bowel sounds are normal. She exhibits no distension and no mass. There is tenderness (Mild RLQ and LLQ tenderness). There is no rebound and no guarding. No hernia.  No CVA tenderness  Musculoskeletal:  Low back midline tenderness  Neurological: She is alert and oriented to person, place, and time.  2+ patellar reflexes bilaterally Ambulatory  Skin: Skin is warm and dry.  Psychiatric: She has a normal mood and affect. Her behavior is normal.  Nursing note  and vitals reviewed.    ED Treatments / Results  Labs (all labs ordered are listed, but only abnormal results are displayed) Labs Reviewed  URINALYSIS, ROUTINE W REFLEX MICROSCOPIC - Abnormal; Notable for the following:       Result Value   APPearance CLOUDY (*)    Hgb urine dipstick MODERATE (*)    Protein, ur 100 (*)    Leukocytes, UA LARGE (*)    Bacteria, UA MANY (*)    Squamous Epithelial / LPF 6-30 (*)    All other components within normal limits  URINE CULTURE    EKG  EKG Interpretation None       Radiology No results found.  Procedures Procedures (including critical care time)  Medications Ordered in ED Medications - No data to display   Initial Impression / Assessment and Plan / ED Course  I have reviewed the triage vital signs and the nursing notes.  Pertinent labs & imaging results that were available during my care of the patient were reviewed by me and considered in my medical decision making (see chart for details).  56 year old female presents with low back pain and urinary symptoms. She is bradycardic but otherwise vitals are normal. UA shows many bacteria, yeast, moderate hgb, large leukocytes, 100 protein, TNTC RBC and WBC although does appear contaminated. Culture was sent. Doubt pyelonephritis however due to her hx of subjective fever and lower abdominal pain will extend antibiotic course to cover for pyelo. Also will cover for a yeast cystitis. I suspect her back pain is more likely due to a worsening of her chronic back pain. Will d/c with Flexeril as well which she states has helped her a lot in the past. Return precautions given.  Final Clinical Impressions(s) / ED Diagnoses   Final diagnoses:  Yeast cystitis  Acute cystitis with hematuria  Acute midline low back pain without sciatica    New Prescriptions Current Discharge Medication List       Bethel BornGekas, Carren Blakley Marie, PA-C 10/12/16 1534    Lavera GuiseLiu, Dana Duo, MD 10/12/16 [phone]

## 2016-10-12 NOTE — Discharge Instructions (Signed)
Please take antifungal (Diflucan) for 2 weeks Take Keflex three times a day for 10 days Take Flexeril as needed Follow up with your doctor Return for worsening symptoms

## 2016-10-13 LAB — URINE CULTURE

## 2016-10-15 NOTE — Telephone Encounter (Signed)
Left a detailed vm for patient.

## 2016-10-16 ENCOUNTER — Encounter: Payer: Self-pay | Admitting: Family Medicine

## 2016-10-16 ENCOUNTER — Ambulatory Visit (INDEPENDENT_AMBULATORY_CARE_PROVIDER_SITE_OTHER): Payer: Medicaid Other | Admitting: Family Medicine

## 2016-10-16 VITALS — BP 134/68 | HR 66 | Temp 98.0°F | Resp 12 | Ht 63.0 in | Wt 244.0 lb

## 2016-10-16 DIAGNOSIS — Z23 Encounter for immunization: Secondary | ICD-10-CM

## 2016-10-16 DIAGNOSIS — I10 Essential (primary) hypertension: Secondary | ICD-10-CM | POA: Diagnosis not present

## 2016-10-16 DIAGNOSIS — L309 Dermatitis, unspecified: Secondary | ICD-10-CM

## 2016-10-16 LAB — POCT URINALYSIS DIP (DEVICE)
BILIRUBIN URINE: NEGATIVE
GLUCOSE, UA: NEGATIVE mg/dL
KETONES UR: NEGATIVE mg/dL
LEUKOCYTES UA: NEGATIVE
NITRITE: NEGATIVE
PH: 5.5 (ref 5.0–8.0)
Protein, ur: 100 mg/dL — AB
Specific Gravity, Urine: >=1.03 (ref 1.005–1.030)
Urobilinogen, UA: 1 mg/dL (ref 0.0–1.0)

## 2016-10-16 MED ORDER — PANTOPRAZOLE SODIUM 40 MG PO TBEC: 40.0000 mg | DELAYED_RELEASE_TABLET | Freq: Two times a day (BID) | ORAL | 6 refills | Status: DC

## 2016-10-16 MED ORDER — CYCLOBENZAPRINE HCL 10 MG PO TABS: 10.0000 mg | ORAL_TABLET | Freq: Three times a day (TID) | ORAL | 1 refills | Status: DC | PRN

## 2016-10-16 MED ORDER — OXYBUTYNIN CHLORIDE ER 5 MG PO TB24: 5.0000 mg | ORAL_TABLET | Freq: Every day | ORAL | 2 refills | Status: DC

## 2016-10-16 MED ORDER — DEXAMETHASONE SODIUM PHOSPHATE 10 MG/ML IJ SOLN
10.0000 mg | Freq: Once | INTRAMUSCULAR | Status: AC
  Administered 2016-10-16: 10 mg via INTRAMUSCULAR

## 2016-10-16 MED ORDER — METOPROLOL SUCCINATE ER 50 MG PO TB24: 100.0000 mg | ORAL_TABLET | Freq: Every day | ORAL | 2 refills | Status: DC

## 2016-10-16 MED ORDER — PREDNISONE 20 MG PO TABS: 20.0000 mg | ORAL_TABLET | Freq: Every day | ORAL | 0 refills | Status: DC

## 2016-10-16 MED ORDER — AMLODIPINE BESYLATE 10 MG PO TABS: 10.0000 mg | ORAL_TABLET | Freq: Every day | ORAL | 2 refills | Status: DC

## 2016-10-16 MED ORDER — GABAPENTIN 300 MG PO CAPS: 300.0000 mg | ORAL_CAPSULE | Freq: Three times a day (TID) | ORAL | 3 refills | Status: DC

## 2016-10-16 MED ORDER — BETAMETHASONE DIPROPIONATE 0.05 % EX OINT: TOPICAL_OINTMENT | Freq: Two times a day (BID) | CUTANEOUS | 0 refills | Status: DC

## 2016-10-16 NOTE — Progress Notes (Signed)
Patient ID: Kathleen Herrera, female    DOB: 21-Aug-1960, 56 y.o.   MRN: [id number]  PCP: Bing Neighbors, FNP  Chief Complaint  Patient presents with  . Medication Refill  . Rash    left arm    Subjective:  HPI Tymber Pixie Herrera is a 56 y.o. female presents for evaluation of rash of left arm and requests a medication refill. [name] reports the persistence of left arm rash for which she was previously treated with triamcinolone ointment which only mildly improved rash for short period of time. The rash is itching, small papules present, and erythema.  Reports that she has changed detergent recently however, rash was present before this change. Reports no associated fever, chills, or soreness of throat. Rash is only present on left arm.  Kathleen Herrera is followed by [facility] behavioral health for anxiety and depression management. She is prescribed Gabapentin and requests a refill of this medication today. She denies active symptoms of depression although notes anxiety which is consistent with her baseline mood.  Social History   Social History  . Marital status: Single    Spouse name: N/A  . Number of children: N/A  . Years of education: N/A   Occupational History  . Not on file.   Social History Main Topics  . Smoking status: Current Every Day Smoker    Packs/day: 0.25    Years: 37.00    Types: Cigarettes  . Smokeless tobacco: Never Used  . Alcohol use Yes     Comment: occasionally  . Drug use: No     Comment: crack  . Sexual activity: Not Currently   Other Topics Concern  . Not on file   Social History Narrative  . No narrative on file    Family History  Problem Relation Age of Onset  . Dementia Mother   . Prostate cancer Father   . Dementia Father   . Prostate cancer Other   . CAD Other    Review of Systems  See HPI   Patient Active Problem List   Diagnosis Date Noted  . Asthma   . CAP (community acquired pneumonia) 09/19/2015  . Dyspnea 09/19/2015  .  Acute respiratory failure with hypoxia (HCC) 09/19/2015  . Abnormality of gait 06/29/2014  . Essential hypertension 06/29/2014  . Hyperlipidemia 06/29/2014  . Metabolic syndrome 06/29/2014  . Neuropathic pain 06/29/2014  . Gastroesophageal reflux disease without esophagitis 06/29/2014  . Tachycardia 06/29/2014  . At risk for osteopenia 06/29/2014  . Excessive daytime sleepiness 06/29/2014  . Fibromyalgia   . Abnormal LFTs 03/06/2013  . Extreme obesity 12/18/2012  . Fatty infiltration of liver 07/15/2012  . Gonalgia 07/15/2012  . LBP (low back pain) 07/15/2012  . Arthritis, degenerative 06/12/2012  . Arthritis 05/26/2012  . Bipolar 2 disorder (HCC) 04/17/2012  . Clinical depression 04/17/2012    No Known Allergies  Prior to Admission medications   Medication Sig Start Date End Date Taking? Authorizing Provider  albuterol (PROVENTIL HFA;VENTOLIN HFA) 108 (90 Base) MCG/ACT inhaler Inhale 2 puffs into the lungs every 6 (six) hours as needed for wheezing or shortness of breath. 09/23/15   Dorothea Ogle, MD  amLODipine (NORVASC) 10 MG tablet Take 1 tablet (10 mg total) by mouth daily. 03/12/16   Henrietta Hoover, NP  budesonide-formoterol (SYMBICORT) 80-4.5 MCG/ACT inhaler Inhale 2 puffs into the lungs 2 (two) times daily. 08/16/16   Bing Neighbors, FNP  cephALEXin (KEFLEX) 500 MG capsule Take 1 capsule (500 mg total)  by mouth 3 (three) times daily. 10/12/16   Bethel BornGekas, Kelly Marie, PA-C  clonazePAM (KLONOPIN) 0.5 MG tablet Take 0.5 mg by mouth 3 (three) times daily as needed for anxiety.    [provider]  clotrimazole-betamethasone (LOTRISONE) cream Apply 1 application topically 2 (two) times daily. Groin area. 08/16/16   Bing NeighborsHarris, Rana Adorno S, FNP  cyclobenzaprine (FLEXERIL) 10 MG tablet Take 1 tablet (10 mg total) by mouth 3 (three) times daily as needed for muscle spasms. 10/12/16   Bethel BornGekas, Kelly Marie, PA-C  fluconazole (DIFLUCAN) 200 MG tablet Take 1 tablet (200 mg total) by mouth  daily. 10/12/16 10/26/16  Bethel BornGekas, Kelly Marie, PA-C  furosemide (LASIX) 20 MG tablet Take 1 tablet (20 mg total) by mouth daily. 08/27/16   Bing NeighborsHarris, Harlym Gehling S, FNP  lamoTRIgine (LAMICTAL) 25 MG tablet Take 25 mg by mouth daily.     [provider]  lithium carbonate 300 MG capsule Take 300 mg by mouth 2 (two) times daily with a meal.     [provider]  metoprolol succinate (TOPROL-XL) 50 MG 24 hr tablet Take 2 tablets (100 mg total) by mouth daily. Take with or immediately following a meal. 03/12/16   Henrietta HooverBernhardt, Linda C, NP  Nystatin POWD Apply 2 sprinkles of powder to the affected inner thighs to prevent itching and fungal rash 08/16/16   Bing NeighborsHarris, Exander Shaul S, FNP  oxybutynin (DITROPAN XL) 5 MG 24 hr tablet Take 1 tablet (5 mg total) by mouth at bedtime. 08/16/16   Bing NeighborsHarris, Amareon Phung S, FNP  pantoprazole (PROTONIX) 40 MG tablet Take 40 mg by mouth 2 (two) times daily.    [provider]  PARoxetine (PAXIL) 20 MG tablet TAKE 1 TABLET (20 MG TOTAL) BY MOUTH 1 DAY OR 1 DOSE. Patient taking differently: TAKE 1 TABLET (20 MG TOTAL) BY MOUTH DAILY 04/09/16   Henrietta HooverBernhardt, Linda C, NP  simvastatin (ZOCOR) 20 MG tablet TAKE ONE TABLET   (20 MG TOTAL ) BY MOUTH   AT BEDTIME 03/12/16   Henrietta HooverBernhardt, Linda C, NP  solifenacin (VESICARE) 5 MG tablet Take 1 tablet (5 mg total) by mouth daily. 09/07/16 09/07/17  Bing NeighborsHarris, Norfleet Capers S, FNP  triamcinolone cream (KENALOG) 0.1 % Apply 1 application topically 2 (two) times daily. Bilateral arms. 10/09/16   Bing NeighborsHarris, Khalifa Knecht S, FNP    Past Medical, Surgical Family and Social History reviewed and updated.    Objective:   Today's Vitals   10/16/16 1005  BP: 134/68  Pulse: 66  Resp: 12  Temp: 98 F (36.7 C)  TempSrc: Oral  SpO2: 95%  Weight: 244 lb (110.7 kg)  Height: 5\' 3"  (1.6 m)    Wt Readings from Last 3 Encounters:  10/16/16 244 lb (110.7 kg)  10/12/16 250 lb (113.4 kg)  08/16/16 254 lb (115.2 kg)   Physical Exam  Constitutional: She is oriented to  person, place, and time. She appears well-developed and well-nourished.  HENT:  Head: Normocephalic and atraumatic.  Eyes: Pupils are equal, round, and reactive to light. Conjunctivae and EOM are normal.  Cardiovascular: Normal rate, regular rhythm, normal heart sounds and intact distal pulses.   Pulmonary/Chest: Effort normal and breath sounds normal.  Musculoskeletal: Normal range of motion.  Neurological: She is alert and oriented to person, place, and time.  Skin: Rash noted. Rash is maculopapular. There is erythema.  Left arm macular rash, pruritic rash, Generalized dryness of skin noted   Psychiatric: She has a normal mood and affect. Her behavior is normal. Judgment and thought  content normal.   Assessment & Plan:  1. Dermatitis, unknown origin , non-responsive to previously prescribed topical steroidal treatment. Dexamethasone 10 ml once now, and start prednisone 40 mg x 5 days and betamethasone topical ointment twice daily. Hydroxyzine use as directed for itching.    2. Essential hypertension, controlled today  -Refilled medication   3. Needs flu shot - Flu Vaccine QUAD 6+ mos PF IM (Fluarix Quad PF)  RTC: 1 week for evaluation of rash and 6 months for chronic disease management   Godfrey Pick. Tiburcio Pea, MSN, FNP-C [facility]  [address] [place], [place] [address] [phone]

## 2016-10-16 NOTE — Patient Instructions (Addendum)
May take hydroxyzine for itching as needed up to 3 times per day.  Take prednisone 40 mg daily with breakfast for 5 days. I am adding betamethasone ointment twice daily for rash.  I refilled all your medications.   Contact Dermatitis Dermatitis is redness, soreness, and swelling (inflammation) of the skin. Contact dermatitis is a reaction to certain substances that touch the skin. There are two types of contact dermatitis:  Irritant contact dermatitis. This type is caused by something that irritates your skin, such as dry hands from washing them too much. This type does not require previous exposure to the substance for a reaction to occur. This type is more common.  Allergic contact dermatitis. This type is caused by a substance that you are allergic to, such as a nickel allergy or poison ivy. This type only occurs if you have been exposed to the substance (allergen) before. Upon a repeat exposure, your body reacts to the substance. This type is less common.  What are the causes? Many different substances can cause contact dermatitis. Irritant contact dermatitis is most commonly caused by exposure to:  Makeup.  Soaps.  Detergents.  Bleaches.  Acids.  Metal salts, such as nickel.  Allergic contact dermatitis is most commonly caused by exposure to:  Poisonous plants.  Chemicals.  Jewelry.  Latex.  Medicines.  Preservatives in products, such as clothing.  What increases the risk? This condition is more likely to develop in:  People who have jobs that expose them to irritants or allergens.  People who have certain medical conditions, such as asthma or eczema.  What are the signs or symptoms? Symptoms of this condition may occur anywhere on your body where the irritant has touched you or is touched by you. Symptoms include:  Dryness or flaking.  Redness.  Cracks.  Itching.  Pain or a burning feeling.  Blisters.  Drainage of small amounts of blood or clear  fluid from skin cracks.  With allergic contact dermatitis, there may also be swelling in areas such as the eyelids, mouth, or genitals. How is this diagnosed? This condition is diagnosed with a medical history and physical exam. A patch skin test may be performed to help determine the cause. If the condition is related to your job, you may need to see an occupational medicine specialist. How is this treated? Treatment for this condition includes figuring out what caused the reaction and protecting your skin from further contact. Treatment may also include:  Steroid creams or ointments. Oral steroid medicines may be needed in more severe cases.  Antibiotics or antibacterial ointments, if a skin infection is present.  Antihistamine lotion or an antihistamine taken by mouth to ease itching.  A bandage (dressing).  Follow these instructions at home: Skin Care  Moisturize your skin as needed.  Apply cool compresses to the affected areas.  Try taking a bath with: ? Epsom salts. Follow the instructions on the packaging. You can get these at your local pharmacy or grocery store. ? Baking soda. Pour a small amount into the bath as directed by your health care provider. ? Colloidal oatmeal. Follow the instructions on the packaging. You can get this at your local pharmacy or grocery store.  Try applying baking soda paste to your skin. Stir water into baking soda until it reaches a paste-like consistency.  Do not scratch your skin.  Bathe less frequently, such as every other day.  Bathe in lukewarm water. Avoid using hot water. Medicines  Take or apply over-the-counter  and prescription medicines only as told by your health care provider.  If you were prescribed an antibiotic medicine, take or apply your antibiotic as told by your health care provider. Do not stop using the antibiotic even if your condition starts to improve. General instructions  Keep all follow-up visits as told by  your health care provider. This is important.  Avoid the substance that caused your reaction. If you do not know what caused it, keep a journal to try to track what caused it. Write down: ? What you eat. ? What cosmetic products you use. ? What you drink. ? What you wear in the affected area. This includes jewelry.  If you were given a dressing, take care of it as told by your health care provider. This includes when to change and remove it. Contact a health care provider if:  Your condition does not improve with treatment.  Your condition gets worse.  You have signs of infection such as swelling, tenderness, redness, soreness, or warmth in the affected area.  You have a fever.  You have new symptoms. Get help right away if:  You have a severe headache, neck pain, or neck stiffness.  You vomit.  You feel very sleepy.  You notice red streaks coming from the affected area.  Your bone or joint underneath the affected area becomes painful after the skin has healed.  The affected area turns darker.  You have difficulty breathing. This information is not intended to replace advice given to you by your health care provider. Make sure you discuss any questions you have with your health care provider. Document Released: 01/20/2000 Document Revised: 06/30/2015 Document Reviewed: 06/09/2014 Elsevier Interactive Patient Education  2018 [employer].

## 2016-10-30 ENCOUNTER — Ambulatory Visit: Payer: Medicaid Other | Admitting: Family Medicine

## 2016-10-31 ENCOUNTER — Inpatient Hospital Stay (HOSPITAL_COMMUNITY)
Admission: EM | Admit: 2016-10-31 | Discharge: 2016-11-03 | DRG: 092 | Disposition: A | Discharge status: Home / Self Care | Payer: Medicaid Other | Attending: Family Medicine | Admitting: Family Medicine

## 2016-10-31 ENCOUNTER — Encounter (HOSPITAL_COMMUNITY): Payer: Self-pay | Admitting: Emergency Medicine

## 2016-10-31 DIAGNOSIS — T481X5A Adverse effect of skeletal muscle relaxants [neuromuscular blocking agents], initial encounter: Secondary | ICD-10-CM | POA: Diagnosis present

## 2016-10-31 DIAGNOSIS — M1990 Unspecified osteoarthritis, unspecified site: Secondary | ICD-10-CM | POA: Diagnosis present

## 2016-10-31 DIAGNOSIS — I10 Essential (primary) hypertension: Secondary | ICD-10-CM | POA: Diagnosis present

## 2016-10-31 DIAGNOSIS — J449 Chronic obstructive pulmonary disease, unspecified: Secondary | ICD-10-CM | POA: Diagnosis present

## 2016-10-31 DIAGNOSIS — M545 Low back pain: Secondary | ICD-10-CM | POA: Diagnosis present

## 2016-10-31 DIAGNOSIS — Z90710 Acquired absence of both cervix and uterus: Secondary | ICD-10-CM

## 2016-10-31 DIAGNOSIS — F4310 Post-traumatic stress disorder, unspecified: Secondary | ICD-10-CM | POA: Diagnosis present

## 2016-10-31 DIAGNOSIS — Y9301 Activity, walking, marching and hiking: Secondary | ICD-10-CM | POA: Diagnosis present

## 2016-10-31 DIAGNOSIS — T380X5A Adverse effect of glucocorticoids and synthetic analogues, initial encounter: Secondary | ICD-10-CM | POA: Diagnosis present

## 2016-10-31 DIAGNOSIS — E785 Hyperlipidemia, unspecified: Secondary | ICD-10-CM | POA: Diagnosis present

## 2016-10-31 DIAGNOSIS — Z6841 Body Mass Index (BMI) 40.0 and over, adult: Secondary | ICD-10-CM

## 2016-10-31 DIAGNOSIS — R270 Ataxia, unspecified: Principal | ICD-10-CM

## 2016-10-31 DIAGNOSIS — H8190 Unspecified disorder of vestibular function, unspecified ear: Secondary | ICD-10-CM

## 2016-10-31 DIAGNOSIS — Z7951 Long term (current) use of inhaled steroids: Secondary | ICD-10-CM

## 2016-10-31 DIAGNOSIS — Z8249 Family history of ischemic heart disease and other diseases of the circulatory system: Secondary | ICD-10-CM

## 2016-10-31 DIAGNOSIS — F419 Anxiety disorder, unspecified: Secondary | ICD-10-CM | POA: Diagnosis present

## 2016-10-31 DIAGNOSIS — T426X5A Adverse effect of other antiepileptic and sedative-hypnotic drugs, initial encounter: Secondary | ICD-10-CM | POA: Diagnosis present

## 2016-10-31 DIAGNOSIS — F17210 Nicotine dependence, cigarettes, uncomplicated: Secondary | ICD-10-CM | POA: Diagnosis present

## 2016-10-31 DIAGNOSIS — M797 Fibromyalgia: Secondary | ICD-10-CM | POA: Diagnosis present

## 2016-10-31 DIAGNOSIS — Z79899 Other long term (current) drug therapy: Secondary | ICD-10-CM

## 2016-10-31 DIAGNOSIS — R42 Dizziness and giddiness: Secondary | ICD-10-CM

## 2016-10-31 DIAGNOSIS — T424X5A Adverse effect of benzodiazepines, initial encounter: Secondary | ICD-10-CM | POA: Diagnosis present

## 2016-10-31 DIAGNOSIS — F429 Obsessive-compulsive disorder, unspecified: Secondary | ICD-10-CM | POA: Diagnosis present

## 2016-10-31 DIAGNOSIS — Z7952 Long term (current) use of systemic steroids: Secondary | ICD-10-CM

## 2016-10-31 DIAGNOSIS — L309 Dermatitis, unspecified: Secondary | ICD-10-CM

## 2016-10-31 DIAGNOSIS — J45909 Unspecified asthma, uncomplicated: Secondary | ICD-10-CM | POA: Diagnosis present

## 2016-10-31 DIAGNOSIS — R4781 Slurred speech: Secondary | ICD-10-CM | POA: Diagnosis present

## 2016-10-31 DIAGNOSIS — F3181 Bipolar II disorder: Secondary | ICD-10-CM | POA: Diagnosis present

## 2016-10-31 DIAGNOSIS — W1830XA Fall on same level, unspecified, initial encounter: Secondary | ICD-10-CM | POA: Diagnosis present

## 2016-10-31 HISTORY — DX: Bipolar disorder, unspecified: F31.9

## 2016-10-31 HISTORY — DX: Ataxia, unspecified: R27.0

## 2016-10-31 HISTORY — DX: Unspecified asthma, uncomplicated: J45.909

## 2016-10-31 LAB — CBC WITH DIFFERENTIAL/PLATELET
Basophils Absolute: 0 10*3/uL (ref 0.0–0.1)
Basophils Relative: 0 %
Eosinophils Absolute: 0.1 10*3/uL (ref 0.0–0.7)
Eosinophils Relative: 1 %
HEMATOCRIT: 41.9 % (ref 36.0–46.0)
HEMOGLOBIN: 13.4 g/dL (ref 12.0–15.0)
LYMPHS ABS: 3.2 10*3/uL (ref 0.7–4.0)
Lymphocytes Relative: 35 %
MCH: 26 pg (ref 26.0–34.0)
MCHC: 32 g/dL (ref 30.0–36.0)
MCV: 81.2 fL (ref 78.0–100.0)
MONO ABS: 0.7 10*3/uL (ref 0.1–1.0)
MONOS PCT: 8 %
NEUTROS ABS: 5.1 10*3/uL (ref 1.7–7.7)
NEUTROS PCT: 56 %
Platelets: 292 10*3/uL (ref 150–400)
RBC: 5.16 MIL/uL — ABNORMAL HIGH (ref 3.87–5.11)
RDW: 15.9 % — AB (ref 11.5–15.5)
WBC: 9.1 10*3/uL (ref 4.0–10.5)

## 2016-10-31 LAB — CBG MONITORING, ED: Glucose-Capillary: 91 mg/dL (ref 65–99)

## 2016-10-31 LAB — URINALYSIS, ROUTINE W REFLEX MICROSCOPIC
Bilirubin Urine: NEGATIVE
Glucose, UA: NEGATIVE mg/dL
Hgb urine dipstick: NEGATIVE
Ketones, ur: NEGATIVE mg/dL
Leukocytes, UA: NEGATIVE
Nitrite: NEGATIVE
Protein, ur: NEGATIVE mg/dL
Specific Gravity, Urine: 1.006 (ref 1.005–1.030)
pH: 5 (ref 5.0–8.0)

## 2016-10-31 LAB — COMPREHENSIVE METABOLIC PANEL
ALK PHOS: 64 U/L (ref 38–126)
ALT: 20 U/L (ref 14–54)
ANION GAP: 9 (ref 5–15)
AST: 18 U/L (ref 15–41)
Albumin: 3.9 g/dL (ref 3.5–5.0)
BILIRUBIN TOTAL: 0.5 mg/dL (ref 0.3–1.2)
BUN: 13 mg/dL (ref 6–20)
CALCIUM: 9.1 mg/dL (ref 8.9–10.3)
CO2: 28 mmol/L (ref 22–32)
Chloride: 103 mmol/L (ref 101–111)
Creatinine, Ser: 0.84 mg/dL (ref 0.44–1.00)
GFR calc Af Amer: >60 mL/min (ref >60)
Glucose, Bld: 111 mg/dL — ABNORMAL HIGH (ref 65–99)
POTASSIUM: 3.5 mmol/L (ref 3.5–5.1)
Sodium: 140 mmol/L (ref 135–145)
TOTAL PROTEIN: 7.4 g/dL (ref 6.5–8.1)

## 2016-10-31 NOTE — ED Provider Notes (Signed)
[facility]-EMERGENCY DEPT Provider Note   CSN: [id number] Arrival date & time: 10/31/16  2149     History   Chief Complaint Chief Complaint  Patient presents with  . Dizziness    HPI Birdie Pixie Casino is a 56 y.o. female.  H/o bipolar, OCD, anxiety, asthma, HTN c/o intermittent dizziness starting 2 days ago and associated with a fall at onset. No injury. No medical evaluation. Today, had another episode of dizziness, transient confusion, fall. Feels off balance when she walks, leading to fall. No visual changes, no sense of room spinning, no syncope or near syncope. No CP, abdominal pain, no nausea or vomiting. Recent changes to medications include increase to Klonopin and gabapentin. She recently was prescribed bifocals that she is getting used to.    The history is provided by the patient and a friend. No language interpreter was used.    Past Medical History:  Diagnosis Date  . Arthritis   . Bronchitis   . Depression   . Fibromyalgia Y-2  . Fibromyalgia unknown  . Hyperlipemia   . Hypertension   . Post traumatic stress disorder   . Sinus congestion     Patient Active Problem List   Diagnosis Date Noted  . Asthma   . CAP (community acquired pneumonia) 09/19/2015  . Dyspnea 09/19/2015  . Acute respiratory failure with hypoxia (HCC) 09/19/2015  . Abnormality of gait 06/29/2014  . Essential hypertension 06/29/2014  . Hyperlipidemia 06/29/2014  . Metabolic syndrome 06/29/2014  . Neuropathic pain 06/29/2014  . Gastroesophageal reflux disease without esophagitis 06/29/2014  . Tachycardia 06/29/2014  . At risk for osteopenia 06/29/2014  . Excessive daytime sleepiness 06/29/2014  . Fibromyalgia   . Abnormal LFTs 03/06/2013  . Extreme obesity 12/18/2012  . Fatty infiltration of liver 07/15/2012  . Gonalgia 07/15/2012  . LBP (low back pain) 07/15/2012  . Arthritis, degenerative 06/12/2012  . Arthritis 05/26/2012  . Bipolar 2 disorder (HCC) 04/17/2012  . Clinical  depression 04/17/2012    Past Surgical History:  Procedure Laterality Date  . ABDOMINAL HYSTERECTOMY      OB History    No data available       Home Medications    Prior to Admission medications   Medication Sig Start Date End Date Taking? Authorizing Provider  albuterol (PROVENTIL HFA;VENTOLIN HFA) 108 (90 Base) MCG/ACT inhaler Inhale 2 puffs into the lungs every 6 (six) hours as needed for wheezing or shortness of breath. 09/23/15   Dorothea Ogle, MD  amLODipine (NORVASC) 10 MG tablet Take 1 tablet (10 mg total) by mouth daily. 10/16/16   Bing Neighbors, FNP  betamethasone dipropionate (DIPROLENE) 0.05 % ointment Apply topically 2 (two) times daily. 10/16/16   Bing Neighbors, FNP  budesonide-formoterol (SYMBICORT) 80-4.5 MCG/ACT inhaler Inhale 2 puffs into the lungs 2 (two) times daily. 08/16/16   Bing Neighbors, FNP  cephALEXin (KEFLEX) 500 MG capsule Take 1 capsule (500 mg total) by mouth 3 (three) times daily. 10/12/16   Bethel Born, PA-C  clonazePAM (KLONOPIN) 0.5 MG tablet Take 0.5 mg by mouth 3 (three) times daily as needed for anxiety.    [provider]  clotrimazole-betamethasone (LOTRISONE) cream Apply 1 application topically 2 (two) times daily. Groin area. 08/16/16   Bing Neighbors, FNP  cyclobenzaprine (FLEXERIL) 10 MG tablet Take 1 tablet (10 mg total) by mouth 3 (three) times daily as needed for muscle spasms. 10/16/16   Bing Neighbors, FNP  furosemide (LASIX) 20 MG tablet Take 1  tablet (20 mg total) by mouth daily. 08/27/16   Bing Neighbors, FNP  gabapentin (NEURONTIN) 300 MG capsule Take 1 capsule (300 mg total) by mouth 3 (three) times daily. 10/16/16   Bing Neighbors, FNP  lamoTRIgine (LAMICTAL) 25 MG tablet Take 25 mg by mouth daily.     [provider]  lithium carbonate 300 MG capsule Take 300 mg by mouth 2 (two) times daily with a meal.     [provider]  metoprolol succinate (TOPROL-XL) 50 MG 24 hr tablet  Take 2 tablets (100 mg total) by mouth daily. Take with or immediately following a meal. 10/16/16   Bing Neighbors, FNP  Nystatin POWD Apply 2 sprinkles of powder to the affected inner thighs to prevent itching and fungal rash 08/16/16   Bing Neighbors, FNP  oxybutynin (DITROPAN XL) 5 MG 24 hr tablet Take 1 tablet (5 mg total) by mouth at bedtime. 10/16/16   Bing Neighbors, FNP  pantoprazole (PROTONIX) 40 MG tablet Take 1 tablet (40 mg total) by mouth 2 (two) times daily before a meal. 10/16/16   Bing Neighbors, FNP  PARoxetine (PAXIL) 20 MG tablet TAKE 1 TABLET (20 MG TOTAL) BY MOUTH 1 DAY OR 1 DOSE. Patient taking differently: TAKE 1 TABLET (20 MG TOTAL) BY MOUTH DAILY 04/09/16   Henrietta Hoover, NP  predniSONE (DELTASONE) 20 MG tablet Take 1 tablet (20 mg total) by mouth daily with breakfast. 10/16/16   Bing Neighbors, FNP  simvastatin (ZOCOR) 20 MG tablet TAKE ONE TABLET   (20 MG TOTAL ) BY MOUTH   AT BEDTIME 03/12/16   Henrietta Hoover, NP  solifenacin (VESICARE) 5 MG tablet Take 1 tablet (5 mg total) by mouth daily. 09/07/16 09/07/17  Bing Neighbors, FNP    Family History Family History  Problem Relation Age of Onset  . Dementia Mother   . Prostate cancer Father   . Dementia Father   . Prostate cancer Other   . CAD Other     Social History Social History  Substance Use Topics  . Smoking status: Current Every Day Smoker    Packs/day: 0.25    Years: 37.00    Types: Cigarettes  . Smokeless tobacco: Never Used  . Alcohol use Yes     Comment: occasionally     Allergies   Patient has no known allergies.   Review of Systems Review of Systems  Constitutional: Negative for appetite change, chills, diaphoresis and fever.  HENT: Negative.  Negative for congestion, ear pain, hearing loss, sore throat and tinnitus.   Eyes: Negative for visual disturbance.  Respiratory: Negative.  Negative for shortness of breath.   Cardiovascular: Negative.  Negative for chest  pain.  Gastrointestinal: Negative.  Negative for abdominal pain and nausea.  Genitourinary: Negative.   Musculoskeletal: Negative.   Skin: Negative.   Neurological: Positive for dizziness, speech difficulty, weakness and light-headedness. Negative for syncope, facial asymmetry and numbness.     Physical Exam Updated Vital Signs BP 118/73   Pulse (!) 53   Temp 98.3 F (36.8 C) (Oral)   Resp 16   SpO2 97%   Physical Exam  Constitutional: She is oriented to person, place, and time. She appears well-developed and well-nourished.  HENT:  Head: Normocephalic and atraumatic.  Eyes: Pupils are equal, round, and reactive to light.  Neck: Normal range of motion. Neck supple.  Cardiovascular: Normal rate and regular rhythm.   Pulmonary/Chest: Effort normal and breath sounds  normal. She has no wheezes. She has no rales.  Abdominal: Soft. Bowel sounds are normal. There is no tenderness. There is no rebound and no guarding.  Musculoskeletal: Normal range of motion. She exhibits no edema.  Neurological: She is alert and oriented to person, place, and time. She displays normal reflexes. No sensory deficit. Coordination normal.  CN's 3-12 grossly intact. Speech is clear and focused. No facial asymmetry. No lateralizing weakness. Reflexes are equal. No deficits of coordination. On ambulation the patient is unsteady and stumbles to the left.   Skin: Skin is warm and dry. No rash noted.  Psychiatric: She has a normal mood and affect.     ED Treatments / Results  Labs (all labs ordered are listed, but only abnormal results are displayed) Labs Reviewed  BASIC METABOLIC PANEL  CBC  URINALYSIS, ROUTINE W REFLEX MICROSCOPIC  CBG MONITORING, ED   Results for orders placed or performed during the hospital encounter of 10/31/16  Urinalysis, Routine w reflex microscopic  Result Value Ref Range   Color, Urine STRAW (A) YELLOW   APPearance CLEAR CLEAR   Specific Gravity, Urine 1.006 1.005 - 1.030     pH 5.0 5.0 - 8.0   Glucose, UA NEGATIVE NEGATIVE mg/dL   Hgb urine dipstick NEGATIVE NEGATIVE   Bilirubin Urine NEGATIVE NEGATIVE   Ketones, ur NEGATIVE NEGATIVE mg/dL   Protein, ur NEGATIVE NEGATIVE mg/dL   Nitrite NEGATIVE NEGATIVE   Leukocytes, UA NEGATIVE NEGATIVE  CBC with Differential  Result Value Ref Range   WBC 9.1 4.0 - 10.5 K/uL   RBC 5.16 (H) 3.87 - 5.11 MIL/uL   Hemoglobin 13.4 12.0 - 15.0 g/dL   HCT 16.1 09.6 - 04.5 %   MCV 81.2 78.0 - 100.0 fL   MCH 26.0 26.0 - 34.0 pg   MCHC 32.0 30.0 - 36.0 g/dL   RDW 40.9 (H) 81.1 - 91.4 %   Platelets 292 150 - 400 K/uL   Neutrophils Relative % 56 %   Neutro Abs 5.1 1.7 - 7.7 K/uL   Lymphocytes Relative 35 %   Lymphs Abs 3.2 0.7 - 4.0 K/uL   Monocytes Relative 8 %   Monocytes Absolute 0.7 0.1 - 1.0 K/uL   Eosinophils Relative 1 %   Eosinophils Absolute 0.1 0.0 - 0.7 K/uL   Basophils Relative 0 %   Basophils Absolute 0.0 0.0 - 0.1 K/uL  Lithium level  Result Value Ref Range   Lithium Lvl 0.76 0.60 - 1.20 mmol/L  Comprehensive metabolic panel  Result Value Ref Range   Sodium 140 135 - 145 mmol/L   Potassium 3.5 3.5 - 5.1 mmol/L   Chloride 103 101 - 111 mmol/L   CO2 28 22 - 32 mmol/L   Glucose, Bld 111 (H) 65 - 99 mg/dL   BUN 13 6 - 20 mg/dL   Creatinine, Ser 7.82 0.44 - 1.00 mg/dL   Calcium 9.1 8.9 - 95.6 mg/dL   Total Protein 7.4 6.5 - 8.1 g/dL   Albumin 3.9 3.5 - 5.0 g/dL   AST 18 15 - 41 U/L   ALT 20 14 - 54 U/L   Alkaline Phosphatase 64 38 - 126 U/L   Total Bilirubin 0.5 0.3 - 1.2 mg/dL   GFR calc non Af Amer >60 >60 mL/min   GFR calc Af Amer >60 >60 mL/min   Anion gap 9 5 - 15  CBG monitoring, ED  Result Value Ref Range   Glucose-Capillary 91 65 - 99 mg/dL  Comment 1 Notify RN    Ct Head Wo Contrast  Result Date: 11/01/2016 CLINICAL DATA:  Sudden onset of dizziness. EXAM: CT HEAD WITHOUT CONTRAST TECHNIQUE: Contiguous axial images were obtained from the base of the skull through the vertex without  intravenous contrast. COMPARISON:  None. FINDINGS: Brain: Brain volume is normal for age. No intracranial hemorrhage, mass effect, or midline shift. No hydrocephalus. The basilar cisterns are patent. No evidence of territorial infarct or acute ischemia. No extra-axial or intracranial fluid collection. Vascular: No hyperdense vessel or unexpected calcification. Skull: No fracture or focal lesion. Sinuses/Orbits: Paranasal sinuses and mastoid air cells are clear. The visualized orbits are unremarkable. Other: None. IMPRESSION: No acute intracranial abnormality. Electronically Signed   By: Rubye Oaks M.D.   On: 11/01/2016 01:01    EKG  EKG Interpretation None       Radiology No results found.  Procedures Procedures (including critical care time)  Medications Ordered in ED Medications - No data to display   Initial Impression / Assessment and Plan / ED Course  I have reviewed the triage vital signs and the nursing notes.  Pertinent labs & imaging results that were available during my care of the patient were reviewed by me and considered in my medical decision making (see chart for details).     Patient presents with 2-3 day history of episodic dizziness/lightheadedness she denies is vertiginous. She has fallen multiple times, no injury. She denies syncope. Per friend at bedside. The patient had slurred speech 4 days ago with mild confusion but the patient does not endorse this.   Head CT negative. Labs reassuring. The patient is ambulated a second time and shows persistent unsteadiness but is felt improved. Positive Romberg for fall backward.   Patient presented to Dr. Preston Fleeting who advises MRI will be necessary for complete evaluation. As the patient remains symptomatic, will require admission. Plan will be to transfer to [facility] as MRI is not available at [facility].  Discussed with neuro-hospitalist, Dr. Jerrell Belfast, who will provide consultation on arrival to [facility]. He requests CT angio  of head and neck. These studies are ordered here while waiting for transport.   Dr. Toniann Fail, [facility], consulted who will admit and arrange for transfer to [facility]. He will order MR studies for patient.    Final Clinical Impressions(s) / ED Diagnoses   Final diagnoses:  None   1. Ataxia  New Prescriptions New Prescriptions   No medications on file     Elpidio Anis, Cordelia Poche 11/01/16 0401    Raeford Razor, MD 11/02/16 1242

## 2016-10-31 NOTE — ED Triage Notes (Signed)
Pt from home via EMS with c/o sudden onset of dizziness that began around 2045. Pt reports dizziness mostly with standing. Pt was able to sit down during this episode at home and did not experience syncope. Normal neuro exam. No orthostatic changes for EMS. Pt denies changes in medications

## 2016-10-31 NOTE — ED Notes (Signed)
Bed: ZO10 Expected date:  Expected time:  Means of arrival:  Comments: 56 yo F  dizziness

## 2016-10-31 NOTE — ED Notes (Signed)
EKG given to EDP,Kohut,MD., for review. 

## 2016-11-01 ENCOUNTER — Encounter (HOSPITAL_COMMUNITY): Payer: Self-pay | Admitting: Internal Medicine

## 2016-11-01 ENCOUNTER — Observation Stay (HOSPITAL_BASED_OUTPATIENT_CLINIC_OR_DEPARTMENT_OTHER): Payer: Medicaid Other

## 2016-11-01 ENCOUNTER — Observation Stay (HOSPITAL_COMMUNITY): Payer: Medicaid Other

## 2016-11-01 ENCOUNTER — Other Ambulatory Visit (HOSPITAL_COMMUNITY): Payer: Medicaid Other

## 2016-11-01 ENCOUNTER — Inpatient Hospital Stay (HOSPITAL_COMMUNITY): Payer: Medicaid Other

## 2016-11-01 ENCOUNTER — Emergency Department (HOSPITAL_COMMUNITY): Payer: Medicaid Other

## 2016-11-01 DIAGNOSIS — L309 Dermatitis, unspecified: Secondary | ICD-10-CM | POA: Diagnosis present

## 2016-11-01 DIAGNOSIS — T424X5A Adverse effect of benzodiazepines, initial encounter: Secondary | ICD-10-CM | POA: Diagnosis present

## 2016-11-01 DIAGNOSIS — T481X5A Adverse effect of skeletal muscle relaxants [neuromuscular blocking agents], initial encounter: Secondary | ICD-10-CM | POA: Diagnosis present

## 2016-11-01 DIAGNOSIS — E785 Hyperlipidemia, unspecified: Secondary | ICD-10-CM | POA: Diagnosis present

## 2016-11-01 DIAGNOSIS — M545 Low back pain: Secondary | ICD-10-CM | POA: Diagnosis present

## 2016-11-01 DIAGNOSIS — T426X5A Adverse effect of other antiepileptic and sedative-hypnotic drugs, initial encounter: Secondary | ICD-10-CM | POA: Diagnosis present

## 2016-11-01 DIAGNOSIS — I10 Essential (primary) hypertension: Secondary | ICD-10-CM | POA: Diagnosis present

## 2016-11-01 DIAGNOSIS — F17210 Nicotine dependence, cigarettes, uncomplicated: Secondary | ICD-10-CM | POA: Diagnosis present

## 2016-11-01 DIAGNOSIS — F4310 Post-traumatic stress disorder, unspecified: Secondary | ICD-10-CM | POA: Diagnosis present

## 2016-11-01 DIAGNOSIS — R270 Ataxia, unspecified: Secondary | ICD-10-CM

## 2016-11-01 DIAGNOSIS — F419 Anxiety disorder, unspecified: Secondary | ICD-10-CM | POA: Diagnosis present

## 2016-11-01 DIAGNOSIS — Z90710 Acquired absence of both cervix and uterus: Secondary | ICD-10-CM | POA: Diagnosis not present

## 2016-11-01 DIAGNOSIS — F3181 Bipolar II disorder: Secondary | ICD-10-CM | POA: Diagnosis present

## 2016-11-01 DIAGNOSIS — J449 Chronic obstructive pulmonary disease, unspecified: Secondary | ICD-10-CM | POA: Diagnosis present

## 2016-11-01 DIAGNOSIS — Y9301 Activity, walking, marching and hiking: Secondary | ICD-10-CM | POA: Diagnosis present

## 2016-11-01 DIAGNOSIS — Z8249 Family history of ischemic heart disease and other diseases of the circulatory system: Secondary | ICD-10-CM | POA: Diagnosis not present

## 2016-11-01 DIAGNOSIS — Z6841 Body Mass Index (BMI) 40.0 and over, adult: Secondary | ICD-10-CM | POA: Diagnosis not present

## 2016-11-01 DIAGNOSIS — F429 Obsessive-compulsive disorder, unspecified: Secondary | ICD-10-CM | POA: Diagnosis present

## 2016-11-01 DIAGNOSIS — Z7951 Long term (current) use of inhaled steroids: Secondary | ICD-10-CM | POA: Diagnosis not present

## 2016-11-01 DIAGNOSIS — R4781 Slurred speech: Secondary | ICD-10-CM | POA: Diagnosis present

## 2016-11-01 DIAGNOSIS — H8190 Unspecified disorder of vestibular function, unspecified ear: Secondary | ICD-10-CM

## 2016-11-01 DIAGNOSIS — Z7952 Long term (current) use of systemic steroids: Secondary | ICD-10-CM | POA: Diagnosis not present

## 2016-11-01 DIAGNOSIS — R42 Dizziness and giddiness: Secondary | ICD-10-CM | POA: Diagnosis present

## 2016-11-01 DIAGNOSIS — H8120 Vestibular neuronitis, unspecified ear: Secondary | ICD-10-CM | POA: Diagnosis not present

## 2016-11-01 DIAGNOSIS — T380X5A Adverse effect of glucocorticoids and synthetic analogues, initial encounter: Secondary | ICD-10-CM | POA: Diagnosis present

## 2016-11-01 DIAGNOSIS — W1830XA Fall on same level, unspecified, initial encounter: Secondary | ICD-10-CM | POA: Diagnosis present

## 2016-11-01 DIAGNOSIS — M797 Fibromyalgia: Secondary | ICD-10-CM | POA: Diagnosis present

## 2016-11-01 DIAGNOSIS — M1990 Unspecified osteoarthritis, unspecified site: Secondary | ICD-10-CM | POA: Diagnosis present

## 2016-11-01 LAB — RAPID URINE DRUG SCREEN, HOSP PERFORMED
AMPHETAMINES: NOT DETECTED
BARBITURATES: NOT DETECTED
Benzodiazepines: NOT DETECTED
Cocaine: NOT DETECTED
OPIATES: NOT DETECTED
TETRAHYDROCANNABINOL: NOT DETECTED

## 2016-11-01 LAB — CBC
HCT: 41.4 % (ref 36.0–46.0)
Hemoglobin: 13.4 g/dL (ref 12.0–15.0)
MCH: 26.4 pg (ref 26.0–34.0)
MCHC: 32.4 g/dL (ref 30.0–36.0)
MCV: 81.5 fL (ref 78.0–100.0)
PLATELETS: 276 10*3/uL (ref 150–400)
RBC: 5.08 MIL/uL (ref 3.87–5.11)
RDW: 16.1 % — AB (ref 11.5–15.5)
WBC: 8.9 10*3/uL (ref 4.0–10.5)

## 2016-11-01 LAB — LIPID PANEL
CHOL/HDL RATIO: 3.2 ratio
CHOLESTEROL: 204 mg/dL — AB (ref 0–200)
HDL: 64 mg/dL (ref >40)
LDL CALC: 87 mg/dL (ref 0–99)
TRIGLYCERIDES: 266 mg/dL — AB (ref <150)
VLDL: 53 mg/dL — AB (ref 0–40)

## 2016-11-01 LAB — LITHIUM LEVEL: Lithium Lvl: 0.76 mmol/L (ref 0.60–1.20)

## 2016-11-01 LAB — CREATININE, SERUM
CREATININE: 0.83 mg/dL (ref 0.44–1.00)
GFR calc Af Amer: >60 mL/min (ref >60)
GFR calc non Af Amer: >60 mL/min (ref >60)

## 2016-11-01 LAB — HEMOGLOBIN A1C
Hgb A1c MFr Bld: 6.4 % — ABNORMAL HIGH (ref 4.8–5.6)
Mean Plasma Glucose: 136.98 mg/dL

## 2016-11-01 LAB — ECHOCARDIOGRAM COMPLETE

## 2016-11-01 LAB — HIV ANTIBODY (ROUTINE TESTING W REFLEX): HIV SCREEN 4TH GENERATION: NONREACTIVE

## 2016-11-01 MED ORDER — STROKE: EARLY STAGES OF RECOVERY BOOK
Freq: Once | Status: AC
  Administered 2016-11-01: 23:00:00
  Filled 2016-11-01 (×2): qty 1

## 2016-11-01 MED ORDER — CYCLOBENZAPRINE HCL 10 MG PO TABS
10.0000 mg | ORAL_TABLET | Freq: Three times a day (TID) | ORAL | Status: DC | PRN
Start: 2016-11-01 — End: 2016-11-02
  Administered 2016-11-01 – 2016-11-02 (×3): 10 mg via ORAL
  Filled 2016-11-01 (×3): qty 1

## 2016-11-01 MED ORDER — METOPROLOL SUCCINATE ER 100 MG PO TB24
100.0000 mg | ORAL_TABLET | Freq: Every day | ORAL | Status: DC
  Administered 2016-11-02 – 2016-11-03 (×2): 100 mg via ORAL
  Filled 2016-11-01 (×3): qty 1

## 2016-11-01 MED ORDER — MOMETASONE FURO-FORMOTEROL FUM 100-5 MCG/ACT IN AERO
2.0000 | INHALATION_SPRAY | Freq: Two times a day (BID) | RESPIRATORY_TRACT | Status: DC
  Administered 2016-11-01 – 2016-11-03 (×5): 2 via RESPIRATORY_TRACT
  Filled 2016-11-01 (×2): qty 8.8

## 2016-11-01 MED ORDER — OXYBUTYNIN CHLORIDE ER 5 MG PO TB24
5.0000 mg | ORAL_TABLET | Freq: Every day | ORAL | Status: DC
  Administered 2016-11-01 – 2016-11-02 (×2): 5 mg via ORAL
  Filled 2016-11-01 (×3): qty 1

## 2016-11-01 MED ORDER — PANTOPRAZOLE SODIUM 40 MG PO TBEC
40.0000 mg | DELAYED_RELEASE_TABLET | Freq: Two times a day (BID) | ORAL | Status: DC
  Administered 2016-11-01 – 2016-11-03 (×6): 40 mg via ORAL
  Filled 2016-11-01 (×6): qty 1

## 2016-11-01 MED ORDER — CLONAZEPAM 1 MG PO TABS: 1.0000 mg | ORAL_TABLET | Freq: Three times a day (TID) | ORAL | Status: DC | PRN

## 2016-11-01 MED ORDER — IOPAMIDOL (ISOVUE-370) INJECTION 76%
INTRAVENOUS | Status: AC
  Administered 2016-11-01: 100 mL via INTRAVENOUS
  Filled 2016-11-01: qty 100

## 2016-11-01 MED ORDER — SIMVASTATIN 20 MG PO TABS
20.0000 mg | ORAL_TABLET | Freq: Every day | ORAL | Status: DC
  Administered 2016-11-01 – 2016-11-03 (×3): 20 mg via ORAL
  Filled 2016-11-01 (×3): qty 1

## 2016-11-01 MED ORDER — PREDNISONE 20 MG PO TABS
20.0000 mg | ORAL_TABLET | Freq: Every day | ORAL | Status: DC
Start: 2016-11-01 — End: 2016-11-03
  Administered 2016-11-01 – 2016-11-03 (×3): 20 mg via ORAL
  Filled 2016-11-01 (×3): qty 1

## 2016-11-01 MED ORDER — ALBUTEROL SULFATE (2.5 MG/3ML) 0.083% IN NEBU: 2.5000 mg | INHALATION_SOLUTION | Freq: Four times a day (QID) | RESPIRATORY_TRACT | Status: DC | PRN

## 2016-11-01 MED ORDER — ACETAMINOPHEN 160 MG/5ML PO SOLN: 650.0000 mg | ORAL | Status: DC | PRN

## 2016-11-01 MED ORDER — IOPAMIDOL (ISOVUE-370) INJECTION 76%
100.0000 mL | Freq: Once | INTRAVENOUS | Status: AC | PRN
  Administered 2016-11-01: 100 mL via INTRAVENOUS

## 2016-11-01 MED ORDER — SODIUM CHLORIDE 0.9 % IV SOLN
INTRAVENOUS | Status: AC
  Administered 2016-11-01: 04:00:00 via INTRAVENOUS

## 2016-11-01 MED ORDER — ALBUTEROL SULFATE HFA 108 (90 BASE) MCG/ACT IN AERS: 2.0000 | INHALATION_SPRAY | Freq: Four times a day (QID) | RESPIRATORY_TRACT | Status: DC | PRN

## 2016-11-01 MED ORDER — GABAPENTIN 300 MG PO CAPS
300.0000 mg | ORAL_CAPSULE | Freq: Three times a day (TID) | ORAL | Status: DC
  Administered 2016-11-01 – 2016-11-03 (×8): 300 mg via ORAL
  Filled 2016-11-01 (×9): qty 1

## 2016-11-01 MED ORDER — LAMOTRIGINE 25 MG PO TABS
25.0000 mg | ORAL_TABLET | Freq: Every day | ORAL | Status: DC
  Administered 2016-11-01 – 2016-11-03 (×3): 25 mg via ORAL
  Filled 2016-11-01 (×3): qty 1

## 2016-11-01 MED ORDER — ASPIRIN 325 MG PO TABS
325.0000 mg | ORAL_TABLET | Freq: Every day | ORAL | Status: DC
  Administered 2016-11-01: 325 mg via ORAL
  Filled 2016-11-01 (×3): qty 1

## 2016-11-01 MED ORDER — LITHIUM CARBONATE 300 MG PO CAPS
300.0000 mg | ORAL_CAPSULE | Freq: Two times a day (BID) | ORAL | Status: DC
  Administered 2016-11-01 – 2016-11-03 (×6): 300 mg via ORAL
  Filled 2016-11-01 (×6): qty 1

## 2016-11-01 MED ORDER — ACETAMINOPHEN 650 MG RE SUPP: 650.0000 mg | RECTAL | Status: DC | PRN

## 2016-11-01 MED ORDER — ASPIRIN 300 MG RE SUPP: 300.0000 mg | Freq: Every day | RECTAL | Status: DC

## 2016-11-01 MED ORDER — ENOXAPARIN SODIUM 40 MG/0.4ML ~~LOC~~ SOLN
40.0000 mg | Freq: Every day | SUBCUTANEOUS | Status: DC
  Administered 2016-11-01 – 2016-11-03 (×3): 40 mg via SUBCUTANEOUS
  Filled 2016-11-01 (×3): qty 0.4

## 2016-11-01 MED ORDER — PERFLUTREN LIPID MICROSPHERE
1.0000 mL | INTRAVENOUS | Status: AC | PRN
  Administered 2016-11-01: 2 mL via INTRAVENOUS
  Filled 2016-11-01: qty 10

## 2016-11-01 MED ORDER — FLUOCINONIDE 0.05 % EX OINT
TOPICAL_OINTMENT | Freq: Two times a day (BID) | CUTANEOUS | Status: DC
  Administered 2016-11-01 – 2016-11-03 (×5): via TOPICAL
  Filled 2016-11-01: qty 15

## 2016-11-01 MED ORDER — AMLODIPINE BESYLATE 5 MG PO TABS
10.0000 mg | ORAL_TABLET | Freq: Every day | ORAL | Status: DC
  Administered 2016-11-01 – 2016-11-03 (×3): 10 mg via ORAL
  Filled 2016-11-01 (×3): qty 2

## 2016-11-01 MED ORDER — ACETAMINOPHEN 325 MG PO TABS: 650.0000 mg | ORAL_TABLET | ORAL | Status: DC | PRN

## 2016-11-01 MED ORDER — PAROXETINE HCL 20 MG PO TABS
20.0000 mg | ORAL_TABLET | Freq: Every day | ORAL | Status: DC
  Administered 2016-11-01 – 2016-11-03 (×3): 20 mg via ORAL
  Filled 2016-11-01 (×3): qty 1

## 2016-11-01 NOTE — Progress Notes (Signed)
  Echocardiogram 2D Echocardiogram with Definity has been performed.  Kathleen Herrera 11/01/2016, 11:31 AM

## 2016-11-01 NOTE — Evaluation (Signed)
Occupational Therapy Evaluation Patient Details Name: Kathleen Herrera MRN: [id number] DOB: Oct 03, 1960 Today's Date: 11/01/2016    History of Present Illness Kathleen Herrera is an 56 y.o. female right-handed with past medical history of hypertension, hyperlipidemia, PTSD, depression/bipolar disorder, asthma who presented to [facility] for vertigo, concern for acute stroke. CT negative - pending MRI and rest of CVA work up.   Clinical Impression   PTA Pt modified independent in ADL and mobility with SPC. Pt is currently min guard for ADL and min guard for mobility with RW (Pt reports she feels much safer with RW after working with PT earlier). Pt with noted complaints of spinning and vertigo. Noted nystagmus when holding eyes to Pt's far left visual field. Cognition seems to be [facility] at basline with noted mental health history. Pt very pleasant and happy to work with therapy. Pt will benefit from skilled OT in the acute setting to maximize safety and independence in ADL. Next session should focus on education of shower chair and safety in shower to prevent falls as well as further visual assessment.     Follow Up Recommendations  No OT follow up;Supervision/Assistance - 24 hour    Equipment Recommendations  Tub/shower seat    Recommendations for Other Services Other (comment);PT consult (Vestibular Eval)     Precautions / Restrictions Precautions Precautions: Fall Precaution Comments: History of falls. Reports fall secondary to dizziness.  Restrictions Weight Bearing Restrictions: No      Mobility Bed Mobility Overal bed mobility: Needs Assistance Bed Mobility: Sit to Supine       Sit to supine: Supervision   General bed mobility comments: no assist needed, extra time required  Transfers Overall transfer level: Needs assistance Equipment used: Rolling walker (2 wheeled) Transfers: Sit to/from Stand Sit to Stand: Min guard         General transfer comment: vc for safe hand  placement    Balance Overall balance assessment: Needs assistance Sitting-balance support: No upper extremity supported;Feet supported Sitting balance-Leahy Scale: Normal     Standing balance support: Bilateral upper extremity supported;During functional activity Standing balance-Leahy Scale: Poor Standing balance comment: Pt reports feeling much safer with RW than SPC                           ADL either performed or assessed with clinical judgement   ADL Overall ADL's : Needs assistance/impaired Eating/Feeding: Modified independent   Grooming: Wash/dry hands;Supervision/safety;Standing Grooming Details (indicate cue type and reason): sink level Upper Body Bathing: Supervision/ safety   Lower Body Bathing: Supervison/ safety   Upper Body Dressing : Modified independent;Sitting Upper Body Dressing Details (indicate cue type and reason): to doff hospital gown like robe Lower Body Dressing: Min guard;Sitting/lateral leans Lower Body Dressing Details (indicate cue type and reason): to don and doff socks sitting EOB Toilet Transfer: Min guard;Cueing for safety;Ambulation;RW;Regular Teacher, adult education Details (indicate cue type and reason): vc for safety and use of RW Toileting- Clothing Manipulation and Hygiene: Modified independent;Sit to/from stand Toileting - Clothing Manipulation Details (indicate cue type and reason): able to manage hospital gown and front peri care without LOB     Functional mobility during ADLs: Min guard;Rolling walker;Cueing for safety General ADL Comments: Pt requires extra time to complete tasks - I believe this is close to her baseline     Vision Baseline Vision/History: Wears glasses Wears Glasses: At all times Patient Visual Report: Other (comment) (dizziness and spinning of vision) Vision  Assessment?: Yes Eye Alignment: Within Functional Limits Ocular Range of Motion: Within Functional Limits Alignment/Gaze Preference: Within  Defined Limits Tracking/Visual Pursuits: Decreased smoothness of eye movement to LEFT superior field;Decreased smoothness of eye movement to LEFT inferior field;Decreased smoothness of horizontal tracking (When holding to Pt's left nystagmus noted) Saccades: Decreased speed of saccadic movement Convergence: Within functional limits Visual Fields:  (Pt running into things on left during ambulation with RW ) Additional Comments: when holding left, nystagmus noted     Perception     Praxis      Pertinent Vitals/Pain Pain Assessment: 0-10 Pain Score: 6  Faces Pain Scale: Hurts a little bit Pain Location: bilat thighs  Pain Descriptors / Indicators: Aching;Sore Pain Intervention(s): Monitored during session;Repositioned     Hand Dominance Right   Extremity/Trunk Assessment Upper Extremity Assessment Upper Extremity Assessment: Defer to OT evaluation   Lower Extremity Assessment Lower Extremity Assessment: Defer to PT evaluation       Communication Communication Communication: No difficulties   Cognition Arousal/Alertness: Awake/alert Behavior During Therapy: WFL for tasks assessed/performed Overall Cognitive Status: Within Functional Limits for tasks assessed                                 General Comments: Pt is easily distractable - baseline mental health issues managed by medication   General Comments       Exercises     Shoulder Instructions      Home Living Family/patient expects to be discharged to:: Private residence Living Arrangements: Non-relatives/Friends (landlord ) Available Help at Discharge: Family;Available 24 hours/day Type of Home: House Home Access: Stairs to enter [employer] of Steps: 3 Entrance Stairs-Rails: Right Home Layout: Two level;Able to live on main level with bedroom/bathroom     Bathroom Shower/Tub: Chief Strategy Officer: Standard     Home Equipment: Cane - single point           Prior Functioning/Environment Level of Independence: Independent with assistive device(s)        Comments: Uses cane for ambulation         OT Problem List: Impaired balance (sitting and/or standing);Impaired vision/perception;Decreased knowledge of use of DME or AE      OT Treatment/Interventions: Self-care/ADL training;DME and/or AE instruction;Therapeutic activities;Visual/perceptual remediation/compensation;Patient/family education;Balance training    OT Goals(Current goals can be found in the care plan section) Acute Rehab OT Goals Patient Stated Goal: to figure out what is going on OT Goal Formulation: With patient Time For Goal Achievement: 11/15/16 Potential to Achieve Goals: Good ADL Goals Pt Will Perform Grooming: with modified independence;standing Pt Will Perform Upper Body Bathing: with modified independence;sitting Pt Will Perform Lower Body Bathing: with modified independence;sit to/from stand Pt Will Perform Tub/Shower Transfer: Tub transfer;shower seat;ambulating;rolling walker Additional ADL Goal #1: Pt will recall 3 ways of preventing falls in the home during ADL with less than 2 verbal cues Additional ADL Goal #2: Pt will recall 2 ways of compensating for visual disturbance to increase safety during ADL with less than 1 cue  OT Frequency: Min 2X/week   Barriers to D/C:            Co-evaluation              AM-PAC PT "6 Clicks" Daily Activity     Outcome Measure Help from another person eating meals?: None Help from another person taking care of personal grooming?: None Help from another person  toileting, which includes using toliet, bedpan, or urinal?: A Little Help from another person bathing (including washing, rinsing, drying)?: A Little Help from another person to put on and taking off regular upper body clothing?: None Help from another person to put on and taking off regular lower body clothing?: A Little 6 Click Score: 21   End of Session  Equipment Utilized During Treatment: Gait belt;Rolling walker Nurse Communication: Mobility status  Activity Tolerance: Patient tolerated treatment well Patient left: in bed;with call bell/phone within reach;with bed alarm set  OT Visit Diagnosis: Unsteadiness on feet (R26.81);Other abnormalities of gait and mobility (R26.89);History of falling (Z91.81);Dizziness and giddiness (R42)                Time: 4540-9811 OT Time Calculation (min): 22 min Charges:  OT General Charges $OT Visit: 1 Visit OT Evaluation $OT Eval Moderate Complexity: 1 Mod G-Codes: OT G-codes **NOT FOR INPATIENT CLASS** Functional Assessment Tool Used: AM-PAC 6 Clicks Daily Activity Functional Limitation: Self care Self Care Current Status (B1478): At least 20 percent but less than 40 percent impaired, limited or restricted Self Care Goal Status (G9562): At least 1 percent but less than 20 percent impaired, limited or restricted   Sherryl Manges OTR/L [phone]  Evern Bio Gala Padovano 11/01/2016, 5:41 PM

## 2016-11-01 NOTE — Progress Notes (Addendum)
Patient received via stretcher. Patient oriented to room, bed alarm activated and patient informed to call for help before getting up . Patient verbalizes understanding. Orders reviewed with patient. [facility] text paged to notify patient has arrived to unit. Awaiting call back.

## 2016-11-01 NOTE — ED Notes (Signed)
Patient transported to CT 

## 2016-11-01 NOTE — Progress Notes (Signed)
Patient seen and evaluated earlier this am by my associate. Please refer to H and P for details regarding assessment and plan.  Currently neurology consulted and patient undergoing stroke work up.  Gen: Pt in nad, alert and awake Cv: no cyanosis Pulm: no increased wob, no wheezes  Will reassess next am. Advanced diet given that patient passed swallow screen  Arleth Mccullar, Pamala Hurry

## 2016-11-01 NOTE — ED Notes (Signed)
Will collect labs once PT return back to rm

## 2016-11-01 NOTE — Evaluation (Signed)
Physical Therapy Evaluation Patient Details Name: Kathleen Herrera MRN: [id number] DOB: 1960/10/31 Today's Date: 11/01/2016   History of Present Illness  Kathleen Herrera is an 56 y.o. female right-handed with past medical history of hypertension, hyperlipidemia, PTSD, depression/bipolar disorder, asthma who presented to [facility] for vertigo, concern for acute stroke. CT negative - pending MRI and rest of CVA work up.  Clinical Impression  Pt admitted secondary to problem above with deficits below. PTA, pt was independent with mobility using cane. Upon eval, pt presenting with decreased balance, decreased strength, and BLE thigh pain. Pt requiring min to min guard assist with mobility. Reported dizziness with head turn to the R during gait, and also reported "funny feeling" in head with visual tracking activity. Recommending use of RW for mobility, as pt reports increased safety with its use vs. use of a cane. Recommending neuro outpatient PT to address dizziness and balance deficits. Will continue to follow acutely to maximize functional mobility independence and safety.     Follow Up Recommendations Outpatient PT;Supervision - Intermittent (neuro outpatient )    Equipment Recommendations  Rolling walker with 5" wheels    Recommendations for Other Services       Precautions / Restrictions Precautions Precautions: Fall Precaution Comments: History of falls. Reports fall secondary to dizziness.  Restrictions Weight Bearing Restrictions: No      Mobility  Bed Mobility Overal bed mobility: Needs Assistance Bed Mobility: Supine to Sit     Supine to sit: Supervision Sit to supine: Supervision   General bed mobility comments: Supervision for safety.   Transfers Overall transfer level: Needs assistance Equipment used: 1 person hand held assist;Rolling walker (2 wheeled) Transfers: Sit to/from Stand Sit to Stand: Min guard         General transfer comment: Min guard for safety.  Sit<>stand X 2. Increase stability noted with use of RW.   Ambulation/Gait Ambulation/Gait assistance: Min assist;Min guard Ambulation Distance (Feet): 100 Feet Assistive device: Rolling walker (2 wheeled);1 person hand held assist Gait Pattern/deviations: Step-through pattern;Decreased stride length;Drifts right/left Gait velocity: Decreased Gait velocity interpretation: Below normal speed for age/gender General Gait Details: Slow, unsteady gait with HHA and use of IV pole. Required min A for steadying. Used RW for further gait and pt increased stability and reported increased safety. Educated to use at home to increase safety and stability.   Stairs            Wheelchair Mobility    Modified Rankin (Stroke Patients Only)       Balance Overall balance assessment: Needs assistance Sitting-balance support: No upper extremity supported;Feet supported Sitting balance-Leahy Scale: Normal     Standing balance support: Bilateral upper extremity supported;During functional activity Standing balance-Leahy Scale: Poor Standing balance comment: Pt reports feeling much safer with RW than SPC                             Pertinent Vitals/Pain Pain Assessment: 0-10 Pain Score: 6  Faces Pain Scale: Hurts a little bit Pain Location: bilat thighs  Pain Descriptors / Indicators: Aching;Sore Pain Intervention(s): Monitored during session;Repositioned    Home Living Family/patient expects to be discharged to:: Private residence Living Arrangements: Non-relatives/Friends (landlord ) Available Help at Discharge: Family;Available 24 hours/day Type of Home: House Home Access: Stairs to enter Entrance Stairs-Rails: Right Entrance Stairs-Number of Steps: 3 Home Layout: Two level;Able to live on main level with bedroom/bathroom Home Equipment: Gilmer Mor - single point  Prior Function Level of Independence: Independent with assistive device(s)         Comments: Uses cane  for ambulation      Hand Dominance   Dominant Hand: Right    Extremity/Trunk Assessment   Upper Extremity Assessment Upper Extremity Assessment: Defer to OT evaluation    Lower Extremity Assessment Lower Extremity Assessment: Generalized weakness;RLE deficits/detail;LLE deficits/detail RLE Deficits / Details: Reports of thigh pain  LLE Deficits / Details: Reports of thigh pain     Cervical / Trunk Assessment Cervical / Trunk Assessment: Normal  Communication   Communication: No difficulties  Cognition Arousal/Alertness: Awake/alert Behavior During Therapy: WFL for tasks assessed/performed Overall Cognitive Status: Within Functional Limits for tasks assessed                                 General Comments: Pt is easily distractable - baseline mental health issues managed by medication      General Comments General comments (skin integrity, edema, etc.): Pt reported "funny feeling" in head with visual tracking. Per OT notes, pt with nystagmus when tracking to the L. No nystagmus noted when sitting up. Pt did report dizziness with R head turns during gait. Coordination in tact with heel to shin test and alternating foot tapping.     Exercises     Assessment/Plan    PT Assessment Patient needs continued PT services  PT Problem List Decreased strength;Decreased balance;Decreased mobility;Decreased knowledge of use of DME;Pain       PT Treatment Interventions Gait training;DME instruction;Stair training;Functional mobility training;Therapeutic activities;Therapeutic exercise;Balance training;Neuromuscular re-education;Patient/family education    PT Goals (Current goals can be found in the Care Plan section)  Acute Rehab PT Goals Patient Stated Goal: to figure out what is going on PT Goal Formulation: With patient Time For Goal Achievement: 11/08/16 Potential to Achieve Goals: Good    Frequency Min 3X/week   Barriers to discharge         Co-evaluation               AM-PAC PT "6 Clicks" Daily Activity  Outcome Measure Difficulty turning over in bed (including adjusting bedclothes, sheets and blankets)?: None Difficulty moving from lying on back to sitting on the side of the bed? : None Difficulty sitting down on and standing up from a chair with arms (e.g., wheelchair, bedside commode, etc,.)?: Unable Help needed moving to and from a bed to chair (including a wheelchair)?: A Little Help needed walking in hospital room?: A Little Help needed climbing 3-5 steps with a railing? : A Little 6 Click Score: 18    End of Session Equipment Utilized During Treatment: Gait belt Activity Tolerance: Patient tolerated treatment well Patient left: in bed;with call bell/phone within reach;Other (comment) (with OT at bedside ) Nurse Communication: Mobility status PT Visit Diagnosis: Difficulty in walking, not elsewhere classified (R26.2);Unsteadiness on feet (R26.81)    Time: 1610-9604 PT Time Calculation (min) (ACUTE ONLY): 25 min   Charges:   PT Evaluation $PT Eval Moderate Complexity: 1 Mod PT Treatments $Gait Training: 8-22 mins   PT G Codes:   PT G-Codes **NOT FOR INPATIENT CLASS** Functional Assessment Tool Used: AM-PAC 6 Clicks Basic Mobility;Clinical judgement Functional Limitation: Mobility: Walking and moving around Mobility: Walking and Moving Around Current Status (V4098): At least 40 percent but less than 60 percent impaired, limited or restricted Mobility: Walking and Moving Around Goal Status [phone]): At least 1 percent but less  than 20 percent impaired, limited or restricted    Gladys Damme, PT, DPT  Acute Rehabilitation Services  Pager: [phone]   Lehman Prom 11/01/2016, 5:53 PM

## 2016-11-01 NOTE — Progress Notes (Signed)
STROKE TEAM PROGRESS NOTE   HISTORY OF PRESENT ILLNESS (per record) Kathleen Herrera is an 56 y.o. female African-American right-handed with past medical history of hypertension, hyperlipidemia, PTSD, depression/bipolar disorder, asthma who presented to [facility] emergency room for sudden-onset vertigo and bilateral leg weakness with fall while walking.  She states that around 9 PM yesterday night after talking the landlord she was walking to her room when she noticed she suddenly became dizzy and the room was spinning. She was no longer able to stand up and slowly got down on the floor. Her symptoms lasted for 1-2 hours and improved by the time she came to the emergency room. She was unable to walk due to imbalance also was noted to have slurred speech. She denies double vision, weakness or one side of her body, difficulty getting words out, loss of consciousness.  She felt the room was spinning in a clockwise direction. She denies any tinnitus.  She had a similar episode the day before when she was leaving the mobile store. She had to sit down the sidewalk and her symptoms resolved after 10-15 minutes. She denies being dehydrated. She cannot recall any triggering factors. She states that she has had some changes to  the dose of  her psychiatric medications- Klonopin and gabapentin. She was recently started on Flexeril for back pain. Her blood pressure was not elevated are low on arrival by EMS.  At [facility] emergency room,he was noted to be unsteady while walking and tilting to the left.  There is concern the patient had an acute stroke and patient was transferred to [facility].    Patient was not administered IV t-PA secondary to arriving outside of the tPA treatment window. She was admitted to General Neurology for further evaluation and treatment.    SUBJECTIVE (INTERVAL HISTORY) Her girlfriend is at the bedside.  The pt is awake, alert, and follows all commands appropriately.  Pt  reports 2 separate vertigo and falling episodes without association of positioning changes, tinnitus, headache, nausea vomiting, or recent infection. Recently started clonopin, having her gabapentin dose doubled, and is on flexeril.   OBJECTIVE Temp:  [98.1 F (36.7 C)-98.4 F (36.9 C)] 98.4 F (36.9 C) (09/27 1213) Pulse Rate:  [53-65] 59 (09/27 1213) Cardiac Rhythm: Normal sinus rhythm (09/27 0703) Resp:  [15-22] 20 (09/27 1213) BP: (99-120)/(60-74) 113/67 (09/27 1213) SpO2:  [92 %-100 %] 97 % (09/27 1213)  CBC:   Recent Labs Lab 10/31/16 2245 11/01/16 0539  WBC 9.1 8.9  NEUTROABS 5.1  --   HGB 13.4 13.4  HCT 41.9 41.4  MCV 81.2 81.5  PLT 292 276    Basic Metabolic Panel:   Recent Labs Lab 10/31/16 2245 11/01/16 0539  NA 140  --   K 3.5  --   CL 103  --   CO2 28  --   GLUCOSE 111*  --   BUN 13  --   CREATININE 0.84 0.83  CALCIUM 9.1  --     Lipid Panel:     Component Value Date/Time   CHOL 204 (H) 11/01/2016 0539   TRIG 266 (H) 11/01/2016 0539   HDL 64 11/01/2016 0539   CHOLHDL 3.2 11/01/2016 0539   VLDL 53 (H) 11/01/2016 0539   LDLCALC 87 11/01/2016 0539   HgbA1c:  Lab Results  Component Value Date   HGBA1C 6.4 (H) 11/01/2016   Urine Drug Screen:     Component Value Date/Time   LABOPIA NONE DETECTED 11/01/2016  0523   COCAINSCRNUR NONE DETECTED 11/01/2016 0523   LABBENZ NONE DETECTED 11/01/2016 0523   AMPHETMU NONE DETECTED 11/01/2016 0523   THCU NONE DETECTED 11/01/2016 0523   LABBARB NONE DETECTED 11/01/2016 0523    Alcohol Level No results found for: [facility]  IMAGING I have personally reviewed the radiological images below and agree with the radiology interpretations.  Ct Angio Head W Or Wo Contrast Ct Angio Neck W And/or Wo Contrast 11/01/2016 IMPRESSION: 1. Patent carotid and vertebral arteries. No dissection, aneurysm, or hemodynamically significant stenosis utilizing NASCET criteria. 2. Patent circle of Willis. No large vessel  occlusion, aneurysm, or significant stenosis. 3. Mild calcified atherosclerotic plaque of carotid siphons, carotid bifurcations, and aortic arch.    Ct Head Wo Contrast 11/01/2016 IMPRESSION: No acute intracranial abnormality.   CTA Head/Neck 11/01/2016 IMPRESSION: 1. Patent carotid and vertebral arteries. No dissection, aneurysm, or hemodynamically significant stenosis utilizing NASCET criteria. 2. Patent circle of Willis. No large vessel occlusion, aneurysm, or significant stenosis. 3. Mild calcified atherosclerotic plaque of carotid siphons, carotid bifurcations, and aortic arch.  MRI Brain Wo Contrast 11/01/2016 pending  TTE 11/01/2016 Study Conclusions - Left ventricle: The cavity size was normal. Wall thickness was normal. Systolic function was normal. The estimated ejection fraction was in the range of 55% to 60%. Wall motion was normal; there were no regional wall motion abnormalities. Left ventricular diastolic function parameters were normal. - Aortic valve: There was no stenosis. - Mitral valve: There was no significant regurgitation. - Left atrium: The atrium was mildly dilated. - Right ventricle: Poorly visualized. The cavity size was normal.  Systolic function was normal. - Pulmonary arteries: No complete TR doppler jet so unable to estimate PA systolic pressure. - Inferior vena cava: The vessel was normal in size. The respirophasic diameter changes were in the normal range (>= 50%), consistent with normal central venous pressure. Impressions: - Normal LV size with EF 55-60%. Normal diastolic function. Normal RV size and systolic function. No significant valvular abnormalities.   PHYSICAL EXAM  Temp:  [97.5 F (36.4 C)-98.4 F (36.9 C)] 97.5 F (36.4 C) (09/27 1603) Pulse Rate:  [53-65] 64 (09/27 1603) Resp:  [15-24] 24 (09/27 1603) BP: (99-120)/(60-74) 110/64 (09/27 1603) SpO2:  [92 %-100 %] 97 % (09/27 1603)  General - morbid obesity, well developed, in no  apparent distress.  Ophthalmologic - Fundi not visualized due to noncooperation.  Cardiovascular - Regular rate and rhythm.  Mental Status -  Level of arousal and orientation to time, place, and person were intact. Language including expression, naming, repetition, comprehension was assessed and found intact. Attention span and concentration were normal. Fund of Knowledge was assessed and was intact.  Cranial Nerves II - XII - II - Visual field intact OU. III, IV, VI - Extraocular movements intact. V - Facial sensation intact bilaterally. VII - Facial movement intact bilaterally. VIII - Hearing & vestibular intact bilaterally. X - Palate elevates symmetrically. XI - Chin turning & shoulder shrug intact bilaterally. XII - Tongue protrusion intact.  Motor Strength - The patient's strength was normal in all extremities and pronator drift was absent.  Bulk was normal and fasciculations were absent.   Motor Tone - Muscle tone was assessed at the neck and appendages and was normal.  Reflexes - The patient's reflexes were 1+ in all extremities and she had no pathological reflexes.  Sensory - Light touch, temperature/pinprick were assessed and were symmetrical.    Coordination - The patient had normal movements in the hands  and feet with no ataxia or dysmetria.  Tremor was absent. Head thrust test negative. Dix-Hallpike maneuver not able to perform due to morbid obesity and lower back pain.  Gait and Station - deferred   ASSESSMENT/PLAN Ms. Liliana Pixie Casino is a 55 y.o. female with history of hypertension, hyperlipidemia, PTSD, depression/bipolar disorder, asthma who presented to [facility] emergency room for sudden-onset vertigo and bilateral leg weakness with fall while walking.  She did not receive IV t-PA due to arriving outside of the tPA treatment window.   Vertigo and the falling episodes x 2 - etiology unclear, history and exam do not support BPPV, vestibular neuritis,  Mnire's disease, basilar type, located migraine. Her symptoms might be due to recent polypharmacy (Klonopin, Flexeril, gabapentin, and steroids) and potential vestibular proxymia  Resultant  at baseline  CT head: no acute stroke  MRI head: pending  CTA Head/Neck: No LVO or high-grade stenosis  2D Echo: EF 55-60%. No source of embolus  UDS negative  LDL 87  HgbA1c 6.4  Lovenox 40 mg sq daily for VTE prophylaxis Diet Heart Room service appropriate? Yes; Fluid consistency: Thin  No antithrombotic prior to admission, now on aspirin 325 mg daily. Continue aspirin on discharge  Patient counseled to be compliant with her antithrombotic medications  Ongoing aggressive stroke risk factor management  Therapy recommendations: pending  Disposition: pending  Hypertension  Stable  Permissive hypertension (OK if < 220/120) but gradually normalize in 5-7 days  Long-term BP goal normotensive  Hyperlipidemia  Home meds: Zocor 20, resumed in hospital  LDL 87, goal < 70  Continue Zocor at discharge  Prediabetes  HgbA1c 6.4, goal < 7.0  DM education provided  Tobacco abuse  Current smoker  Smoking cessation counseling provided  Pt is willing to quit  Other Stroke Risk Factors  Morbid obesity, counseled to lose weight  ETOH use, advised to drink no more than 2 drink(s) a day  Other Active Problems  Dermatitis, on topical and the by mouth steroids  Lower back pain on Flexeril and increased dose of gabapentin  Hospital day # 0  Marvel Plan, MD PhD Stroke Neurology 11/01/2016 5:53 PM  To contact Stroke Continuity provider, please refer to [email or web address]. After hours, contact General Neurology

## 2016-11-01 NOTE — Consult Note (Signed)
Requesting Physician:  [facility]    Chief Complaint: Vertigo, gait imbalance  History obtained from:   Patient and Chart  HPI:                                                                                                                                       Kathleen Herrera is an 56 y.o. female African-American right-handed with past medical history of hypertension, hyperlipidemia, PTSD, depression/bipolar disorder, asthma who presented to [facility] emergency room for vertigo.  She states that around 9 PM yesterday night after talking the landlord she was walking to her room when she noticed she suddenly became dizzy and the room was spinning. She was no longer able to stand up and slowly got down on the floor. Her symptoms lasted for 1-2 hours and improved by the time she came to the emergency room. She was unable to walk due to imbalance also was noted to have slurred speech. She denies double vision, weakness or one side of her body, difficulty getting words out, loss of consciousness. She felt the room was spinning in a clockwise direction. She denies any tinnitus.  She had a similar episode the day before when she was leaving the mobile store. She had to sit down the sidewalk and her symptoms resolved after 10-15 minutes. She denies being dehydrated. She cannot recall any triggering factors. She states that she has had some changes to  the dose of  her psychiatric medications- Klonopin and gabapentin. She was recently started on Flexeril for back pain. Her blood pressure was not elevated are low on arrival by EMS.  At [facility] emergency room,he was noted to be unsteady while walking and tilting to the left. . There is concern the patient had an acute stroke and patient was transferred to [facility].     Past Medical History:  Diagnosis Date  . Arthritis   . Asthma   . 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 Bipolar disorder (HCC)   . Bronchitis   . Depression   . Fibromyalgia Y-2   . Fibromyalgia unknown  . Hyperlipemia   . Hypertension   . Post traumatic stress disorder   . Sinus congestion     Past Surgical History:  Procedure Laterality Date  . ABDOMINAL HYSTERECTOMY      Family History  Problem Relation Age of Onset  . Dementia Mother   . Prostate cancer Father   . Dementia Father   . Prostate cancer Other   . CAD Other    Social History:  reports that she has been smoking Cigarettes.  She has a 9.25 pack-year smoking history. She has never used smokeless tobacco. She reports that she drinks alcohol. She reports that she does not use drugs.  Allergies: No Known Allergies  Medications:  Reviewed medications  ROS:                                                                                                                                       14 systems reviewed and negative except for above   Examination:                                                                                                      General: Appears well-developed, obese Psych: Affect appropriate to situation Eyes: No scleral injection HENT: No OP obstrucion Head: Normocephalic.  Cardiovascular: Normal rate and regular rhythm.  Respiratory: Effort normal and breath sounds normal to anterior ascultation GI: Soft.  No distension. There is no tenderness.  Skin: WDI   Neurological Examination Mental Status: Alert, oriented, thought content appropriate.  Speech fluent without evidence of aphasia.  Able to follow 3 step commands without difficulty. Cranial Nerves: II: Discs flat bilaterally; Visual fields grossly normal,  III,IV, VI: ptosis not present, extra-ocular motions intact bilaterally, pupils equal, round, reactive to light and accommodation V,VII: smile symmetric, facial light touch sensation normal bilaterally VIII: hearing normal  bilaterally IX,X: uvula rises symmetrically XI: bilateral shoulder shrug XII: midline tongue extension Motor: Right : Upper extremity   5/5    Left:     Upper extremity   5/5  Lower extremity   5/5     Lower extremity   5/5 Tone and bulk:normal tone throughout; no atrophy noted Sensory: Pinprick and light touch intact throughout, bilaterally Deep Tendon Reflexes: 2+ and symmetric throughout Plantars: Right: downgoing   Left: downgoing Cerebellar: normal finger-to-nose, normal rapid alternating movements and normal heel-to-shin test Gait: normal gait and station     Lab Results: Basic Metabolic Panel:  Recent Labs Lab 10/31/16 2245 11/01/16 0539  NA 140  --   K 3.5  --   CL 103  --   CO2 28  --   GLUCOSE 111*  --   BUN 13  --   CREATININE 0.84 0.83  CALCIUM 9.1  --     CBC:  Recent Labs Lab 10/31/16 2245 11/01/16 0539  WBC 9.1 8.9  NEUTROABS 5.1  --   HGB 13.4 13.4  HCT 41.9 41.4  MCV 81.2 81.5  PLT 292 276    Coagulation Studies: No results for input(s): LABPROT, INR in the last 72 hours.  Imaging: Ct Angio Head W Or Wo Contrast  Result Date: 11/01/2016 CLINICAL DATA:  56 y/o  F; sudden  onset dizziness, suspected stroke. EXAM: CT ANGIOGRAPHY HEAD AND NECK TECHNIQUE: Multidetector CT imaging of the head and neck was performed using the standard protocol during bolus administration of intravenous contrast. Multiplanar CT image reconstructions and MIPs were obtained to evaluate the vascular anatomy. Carotid stenosis measurements (when applicable) are obtained utilizing NASCET criteria, using the distal internal carotid diameter as the denominator. CONTRAST:  100 cc Isovue 370 COMPARISON:  11/01/2016 CT of the head FINDINGS: CTA NECK FINDINGS Aortic arch: Standard branching. Imaged portion shows no evidence of aneurysm or dissection. No significant stenosis of the major arch vessel origins. Calcific atherosclerosis. Right carotid system: No evidence of dissection,  stenosis (50% or greater) or occlusion. Mild calcified plaque of the carotid bifurcations. Retropharyngeal course of upper CCA. Mid cervical ICA hairpin turn. Left carotid system: No evidence of dissection, stenosis (50% or greater) or occlusion. Mild calcific plaque of carotid bifurcations. Retropharyngeal course of upper CCA. Vertebral arteries: Left dominant. No evidence of dissection, stenosis (50% or greater) or occlusion. Skeleton: Negative. Other neck: Negative. Upper chest: Negative. Review of the MIP images confirms the above findings CTA HEAD FINDINGS Anterior circulation: No significant stenosis, proximal occlusion, aneurysm, or vascular malformation. Mild calcified plaque of carotid siphons. Posterior circulation: No significant stenosis, proximal occlusion, aneurysm, or vascular malformation. Venous sinuses: As permitted by contrast timing, patent. Anatomic variants: Right fetal PCA. Patent anterior and posterior communicating arteries. Delayed phase: No abnormal intracranial enhancement. Review of the MIP images confirms the above findings IMPRESSION: 1. Patent carotid and vertebral arteries. No dissection, aneurysm, or hemodynamically significant stenosis utilizing NASCET criteria. 2. Patent circle of Willis. No large vessel occlusion, aneurysm, or significant stenosis. 3. Mild calcified atherosclerotic plaque of carotid siphons, carotid bifurcations, and aortic arch. Electronically Signed   By: Mitzi Hansen M.D.   On: 11/01/2016 06:38   Ct Head Wo Contrast  Result Date: 11/01/2016 CLINICAL DATA:  Sudden onset of dizziness. EXAM: CT HEAD WITHOUT CONTRAST TECHNIQUE: Contiguous axial images were obtained from the base of the skull through the vertex without intravenous contrast. COMPARISON:  None. FINDINGS: Brain: Brain volume is normal for age. No intracranial hemorrhage, mass effect, or midline shift. No hydrocephalus. The basilar cisterns are patent. No evidence of territorial infarct  or acute ischemia. No extra-axial or intracranial fluid collection. Vascular: No hyperdense vessel or unexpected calcification. Skull: No fracture or focal lesion. Sinuses/Orbits: Paranasal sinuses and mastoid air cells are clear. The visualized orbits are unremarkable. Other: None. IMPRESSION: No acute intracranial abnormality. Electronically Signed   By: Rubye Oaks M.D.   On: 11/01/2016 01:01   Ct Angio Neck W And/or Wo Contrast  Result Date: 11/01/2016 CLINICAL DATA:  56 y/o  F; sudden onset dizziness, suspected stroke. EXAM: CT ANGIOGRAPHY HEAD AND NECK TECHNIQUE: Multidetector CT imaging of the head and neck was performed using the standard protocol during bolus administration of intravenous contrast. Multiplanar CT image reconstructions and MIPs were obtained to evaluate the vascular anatomy. Carotid stenosis measurements (when applicable) are obtained utilizing NASCET criteria, using the distal internal carotid diameter as the denominator. CONTRAST:  100 cc Isovue 370 COMPARISON:  11/01/2016 CT of the head FINDINGS: CTA NECK FINDINGS Aortic arch: Standard branching. Imaged portion shows no evidence of aneurysm or dissection. No significant stenosis of the major arch vessel origins. Calcific atherosclerosis. Right carotid system: No evidence of dissection, stenosis (50% or greater) or occlusion. Mild calcified plaque of the carotid bifurcations. Retropharyngeal course of upper CCA. Mid cervical ICA hairpin turn. Left carotid system: No  evidence of dissection, stenosis (50% or greater) or occlusion. Mild calcific plaque of carotid bifurcations. Retropharyngeal course of upper CCA. Vertebral arteries: Left dominant. No evidence of dissection, stenosis (50% or greater) or occlusion. Skeleton: Negative. Other neck: Negative. Upper chest: Negative. Review of the MIP images confirms the above findings CTA HEAD FINDINGS Anterior circulation: No significant stenosis, proximal occlusion, aneurysm, or vascular  malformation. Mild calcified plaque of carotid siphons. Posterior circulation: No significant stenosis, proximal occlusion, aneurysm, or vascular malformation. Venous sinuses: As permitted by contrast timing, patent. Anatomic variants: Right fetal PCA. Patent anterior and posterior communicating arteries. Delayed phase: No abnormal intracranial enhancement. Review of the MIP images confirms the above findings IMPRESSION: 1. Patent carotid and vertebral arteries. No dissection, aneurysm, or hemodynamically significant stenosis utilizing NASCET criteria. 2. Patent circle of Willis. No large vessel occlusion, aneurysm, or significant stenosis. 3. Mild calcified atherosclerotic plaque of carotid siphons, carotid bifurcations, and aortic arch. Electronically Signed   By: Mitzi Hansen M.D.   On: 11/01/2016 06:38     ASSESSMENT AND PLAN   56 y.o. female African-American right-handed with past medical history of obesity, hypertension, hyperlipidemia, PTSD, depression/bipolar disorder, asthma presents with 2 episodes of vertigo.she was noted to have gait imbalance, tilting towards the left knee are transferred for a stroke workup. Currently her exam is nonfocal, I did not walk the patient however the patient says that she still has some imbalance while walking but is much improved. While stroke should be ruled out given her's vascular risk factors acute presentation of gait imbalance and vertigo, the depression still is broad and can be peripheral in origin.    Recommend # MRI of the brain without contrast # CTA head and neck  #Transthoracic Echo  # Start patient on ASA 325 mg  #Start or continue Atorvastatin 40 mg/other high intensity statin # BP goal: permissive HTN upto 210 systolic, PRNs above 21 # HBAIC and Lipid profile # Telemetry monitoring # Frequent neuro checks # NPO until passes stroke swallow screen  Please page stroke NP  Or  PA  Or MD from 8am -4 pm  as this patient from this  time will be  followed by the stroke.   You can look them up on [email or web address]  Password [facility]   [place] [facility] Pager Number [phone]

## 2016-11-01 NOTE — H&P (Signed)
History and Physical    Kathleen Herrera ZOX:[id number] DOB: 03/10/1960 DOA: 10/31/2016  PCP: Bing Neighbors, FNP  Patient coming from: Home.  Chief Complaint: Dizziness and fall.  HPI: Kathleen Herrera is a 55 y.o. female with history of hypertension, bipolar disorder, asthma presents to the ER with complaints of having persistent dizziness over the last few weeks. Patient states over the last 24 hours patient had 2 falls where patient was feeling dizzy and lost balance and fell. Denies hitting her head or losing consciousness. Patient came to the ER. Denies any palpitations shortness of breath focal weakness. Denies any headache or visual symptoms nausea vomiting or diarrhea. Patient has been recently placed on prednisone for dermatitis of her left upper extremity. Also was placed on gabapentin 2 months ago.   ED Course: In the ER on exam patient appears to be ataxic with dysdiadochokinesia. Concern for stroke and neurologist on-call was consulted. CT head was unremarkable and neurologist recommended CT angiogram of the head and neck followed by the stroke workup. Patient will be transferred to [facility].  Review of Systems: As per HPI, rest all negative.   Past Medical History:  Diagnosis Date  . Arthritis   . Bronchitis   . Depression   . Fibromyalgia Y-2  . Fibromyalgia unknown  . Hyperlipemia   . Hypertension   . Post traumatic stress disorder   . Sinus congestion     Past Surgical History:  Procedure Laterality Date  . ABDOMINAL HYSTERECTOMY       reports that she has been smoking Cigarettes.  She has a 9.25 pack-year smoking history. She has never used smokeless tobacco. She reports that she drinks alcohol. She reports that she does not use drugs.  No Known Allergies  Family History  Problem Relation Age of Onset  . Dementia Mother   . Prostate cancer Father   . Dementia Father   . Prostate cancer Other   . CAD Other     Prior to Admission  medications   Medication Sig Start Date End Date Taking? Authorizing Provider  albuterol (PROVENTIL HFA;VENTOLIN HFA) 108 (90 Base) MCG/ACT inhaler Inhale 2 puffs into the lungs every 6 (six) hours as needed for wheezing or shortness of breath. 09/23/15  Yes Dorothea Ogle, MD  amLODipine (NORVASC) 10 MG tablet Take 1 tablet (10 mg total) by mouth daily. 10/16/16  Yes Bing Neighbors, FNP  betamethasone dipropionate (DIPROLENE) 0.05 % ointment Apply topically 2 (two) times daily. 10/16/16  Yes Bing Neighbors, FNP  budesonide-formoterol (SYMBICORT) 80-4.5 MCG/ACT inhaler Inhale 2 puffs into the lungs 2 (two) times daily. 08/16/16  Yes Bing Neighbors, FNP  clonazePAM (KLONOPIN) 0.5 MG tablet Take 1 mg by mouth 3 (three) times daily as needed for anxiety.    Yes [provider]  clotrimazole-betamethasone (LOTRISONE) cream Apply 1 application topically 2 (two) times daily. Groin area. 08/16/16  Yes Bing Neighbors, FNP  cyclobenzaprine (FLEXERIL) 10 MG tablet Take 1 tablet (10 mg total) by mouth 3 (three) times daily as needed for muscle spasms. 10/16/16  Yes Bing Neighbors, FNP  furosemide (LASIX) 20 MG tablet Take 1 tablet (20 mg total) by mouth daily. 08/27/16  Yes Bing Neighbors, FNP  gabapentin (NEURONTIN) 300 MG capsule Take 1 capsule (300 mg total) by mouth 3 (three) times daily. 10/16/16  Yes Bing Neighbors, FNP  lamoTRIgine (LAMICTAL) 25 MG tablet Take 25 mg by mouth daily.    Yes  [provider]  lithium carbonate 300 MG capsule Take 300 mg by mouth 2 (two) times daily with a meal.    Yes [provider]  metoprolol succinate (TOPROL-XL) 50 MG 24 hr tablet Take 2 tablets (100 mg total) by mouth daily. Take with or immediately following a meal. 10/16/16  Yes Bing Neighbors, FNP  Nystatin POWD Apply 2 sprinkles of powder to the affected inner thighs to prevent itching and fungal rash 08/16/16  Yes Bing Neighbors, FNP  oxybutynin (DITROPAN XL) 5  MG 24 hr tablet Take 1 tablet (5 mg total) by mouth at bedtime. 10/16/16  Yes Bing Neighbors, FNP  pantoprazole (PROTONIX) 40 MG tablet Take 1 tablet (40 mg total) by mouth 2 (two) times daily before a meal. 10/16/16  Yes Bing Neighbors, FNP  PARoxetine (PAXIL) 20 MG tablet TAKE 1 TABLET (20 MG TOTAL) BY MOUTH 1 DAY OR 1 DOSE. Patient taking differently: TAKE 1 TABLET (20 MG TOTAL) BY MOUTH DAILY 04/09/16  Yes Henrietta Hoover, NP  predniSONE (DELTASONE) 20 MG tablet Take 1 tablet (20 mg total) by mouth daily with breakfast. 10/16/16  Yes Bing Neighbors, FNP  simvastatin (ZOCOR) 20 MG tablet TAKE ONE TABLET   (20 MG TOTAL ) BY MOUTH   AT BEDTIME 03/12/16  Yes Henrietta Hoover, NP  solifenacin (VESICARE) 5 MG tablet Take 1 tablet (5 mg total) by mouth daily. 09/07/16 09/07/17 Yes Bing Neighbors, FNP  cephALEXin (KEFLEX) 500 MG capsule Take 1 capsule (500 mg total) by mouth 3 (three) times daily. 10/12/16   Bethel Born, PA-C    Physical Exam: Vitals:   10/31/16 2230 10/31/16 2233 11/01/16 0100 11/01/16 0317  BP: 118/73  105/65 114/73  Pulse:  (!) 53 63 (!) 56  Resp: Temp:    98.3 F (36.8 C)  TempSrc:    Oral  SpO2:  97% 95% 97%      Constitutional: Moderately built and nourished. Vitals:   10/31/16 2230 10/31/16 2233 11/01/16 0100 11/01/16 0317  BP: 118/73  105/65 114/73  Pulse:  (!) 53 63 (!) 56  Resp: Temp:    98.3 F (36.8 C)  TempSrc:    Oral  SpO2:  97% 95% 97%   Eyes: Anicteric no pallor. ENMT: No discharge from the ears eyes nose or mouth. Neck: No neck rigidity no mass felt. No JVD appreciated. Respiratory: No rhonchi or crepitations. Cardiovascular: S1-S2 heard no murmurs appreciated. Abdomen: Soft nontender bowel sounds present. No guarding or rigidity. Musculoskeletal: No edema. No joint effusion. Skin: No rash. Skin appears warm. Neurologic: Alert awake oriented to time place and person. Moves all extremities. Psychiatric:  Appears normal. Normal affect.   Labs on Admission: I have personally reviewed following labs and imaging studies  CBC:  Recent Labs Lab 10/31/16 2245  WBC 9.1  NEUTROABS 5.1  HGB 13.4  HCT 41.9  MCV 81.2  PLT 292   Basic Metabolic Panel:  Recent Labs Lab 10/31/16 2245  NA 140  K 3.5  CL 103  CO2 28  GLUCOSE 111*  BUN 13  CREATININE 0.84  CALCIUM 9.1   GFR: CrCl cannot be calculated (Unknown ideal weight.). Liver Function Tests:  Recent Labs Lab 10/31/16 2245  AST 18  ALT 20  ALKPHOS 64  BILITOT 0.5  PROT 7.4  ALBUMIN 3.9   No results for input(s): LIPASE, AMYLASE in the last 168 hours. No  results for input(s): AMMONIA in the last 168 hours. Coagulation Profile: No results for input(s): INR, PROTIME in the last 168 hours. Cardiac Enzymes: No results for input(s): CKTOTAL, CKMB, CKMBINDEX, TROPONINI in the last 168 hours. BNP (last 3 results) No results for input(s): PROBNP in the last 8760 hours. HbA1C: No results for input(s): HGBA1C in the last 72 hours. CBG:  Recent Labs Lab 10/31/16 2241  GLUCAP 91   Lipid Profile: No results for input(s): CHOL, HDL, LDLCALC, TRIG, CHOLHDL, LDLDIRECT in the last 72 hours. Thyroid Function Tests: No results for input(s): TSH, T4TOTAL, FREET4, T3FREE, THYROIDAB in the last 72 hours. Anemia Panel: No results for input(s): VITAMINB12, FOLATE, FERRITIN, TIBC, IRON, RETICCTPCT in the last 72 hours. Urine analysis:    Component Value Date/Time   COLORURINE STRAW (A) 10/31/2016 2232   APPEARANCEUR CLEAR 10/31/2016 2232   LABSPEC 1.006 10/31/2016 2232   PHURINE 5.0 10/31/2016 2232   GLUCOSEU NEGATIVE 10/31/2016 2232   HGBUR NEGATIVE 10/31/2016 2232   BILIRUBINUR NEGATIVE 10/31/2016 2232   KETONESUR NEGATIVE 10/31/2016 2232   PROTEINUR NEGATIVE 10/31/2016 2232   UROBILINOGEN 1.0 10/16/2016 1017   NITRITE NEGATIVE 10/31/2016 2232   LEUKOCYTESUR NEGATIVE 10/31/2016 2232   Sepsis  Labs: (procalcitonin:4,lacticidven:4) )No results found for this or any previous visit (from the past 240 hour(s)).   Radiological Exams on Admission: Ct Head Wo Contrast  Result Date: 11/01/2016 CLINICAL DATA:  Sudden onset of dizziness. EXAM: CT HEAD WITHOUT CONTRAST TECHNIQUE: Contiguous axial images were obtained from the base of the skull through the vertex without intravenous contrast. COMPARISON:  None. FINDINGS: Brain: Brain volume is normal for age. No intracranial hemorrhage, mass effect, or midline shift. No hydrocephalus. The basilar cisterns are patent. No evidence of territorial infarct or acute ischemia. No extra-axial or intracranial fluid collection. Vascular: No hyperdense vessel or unexpected calcification. Skull: No fracture or focal lesion. Sinuses/Orbits: Paranasal sinuses and mastoid air cells are clear. The visualized orbits are unremarkable. Other: None. IMPRESSION: No acute intracranial abnormality. Electronically Signed   By: Rubye Oaks M.D.   On: 11/01/2016 01:01    EKG: Independently reviewed. Normal sinus rhythm with acceptable QTC interval.  Assessment/Plan Principal Problem:   Ataxia Active Problems:   Essential hypertension   Hyperlipidemia   Bipolar 2 disorder (HCC)   Asthma    1. Ataxia with near syncope - patient's symptoms are concerning for stroke for which CT angiogram of the head and neck as been ordered after discussing with neurologist. Patient will be transferred to [facility]. Dr. Antionette Char will be the accepting physician. MRI/MRA brain 2-D echo has been ordered. Swallow evaluation and neuro checks. Check hemoglobin A1c and lipid panel. Closely monitor in telemetry get physical therapy consult. 2. Hypertension - will continue amlodipine and Toprol. Hold Lasix. 3. Asthma/COPD - not actively wheezing. Continue home inhalers. 4. Bipolar disorder - lithium levels are not in toxic range. On lithium and Lamictal. Recently added  gabapentin.  I have reviewed patient's old charts and labs.   DVT prophylaxis: Lovenox. Code Status: Full code.  Family Communication: Discussed with patient.  Disposition Plan: Home.  Consults called: Neurology.  Admission status: Observation.    Kathleen Clos MD [facility] Pager [phone].  If 7PM-7AM, please contact night-coverage [email or web address] Password [facility]  11/01/2016, 3:56 AM

## 2016-11-01 NOTE — ED Notes (Signed)
Pt. In xray 

## 2016-11-02 MED ORDER — CYCLOBENZAPRINE HCL 5 MG PO TABS
5.0000 mg | ORAL_TABLET | Freq: Three times a day (TID) | ORAL | Status: DC | PRN
  Administered 2016-11-03: 5 mg via ORAL
  Filled 2016-11-02 (×2): qty 1

## 2016-11-02 NOTE — Evaluation (Signed)
Speech Language Pathology Evaluation Patient Details Name: Kathleen Herrera MRN: [id number] DOB: 1960-04-07 Today's Date: 11/02/2016 Time: 8413-2440 SLP Time Calculation (min) (ACUTE ONLY): 22 min  Problem List:  Patient Active Problem List   Diagnosis Date Noted  . 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 Episodic recurrent vertigo   . Dermatitis   . Asthma   . CAP (community acquired pneumonia) 09/19/2015  . Dyspnea 09/19/2015  . Acute respiratory failure with hypoxia (HCC) 09/19/2015  . Abnormality of gait 06/29/2014  . Essential hypertension 06/29/2014  . Hyperlipidemia 06/29/2014  . Metabolic syndrome 06/29/2014  . Neuropathic pain 06/29/2014  . Gastroesophageal reflux disease without esophagitis 06/29/2014  . Tachycardia 06/29/2014  . At risk for osteopenia 06/29/2014  . Excessive daytime sleepiness 06/29/2014  . Fibromyalgia   . Abnormal LFTs 03/06/2013  . Extreme obesity 12/18/2012  . Fatty infiltration of liver 07/15/2012  . Gonalgia 07/15/2012  . LBP (low back pain) 07/15/2012  . Arthritis, degenerative 06/12/2012  . Arthritis 05/26/2012  . Bipolar 2 disorder (HCC) 04/17/2012  . Clinical depression 04/17/2012   Past Medical History:  Past Medical History:  Diagnosis Date  . Arthritis   . Asthma   . 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 Bipolar disorder (HCC)   . Bronchitis   . Depression   . Fibromyalgia Y-2  . Fibromyalgia unknown  . Hyperlipemia   . Hypertension   . Post traumatic stress disorder   . Sinus congestion    Past Surgical History:  Past Surgical History:  Procedure Laterality Date  . ABDOMINAL HYSTERECTOMY     HPI:  Kathleen Herrera is an 56 y.o. female right-handed  with past medical history of hypertension, hyperlipidemia, PTSD, depression/bipolar disorder, asthma who presented to [facility] for vertigo, concern for acute stroke. CT negative. MRI Normal MRI of the brain.   Assessment / Plan / Recommendation Clinical Impression  Pt reportd difficulty "getting her words  out" since her fall yesterday and recent memory problems. She scored within normal range on Cognistat except for mild deficits with word retrival. States she often forgets appointments. Speech is min-mild dysfluent and pt provided with verbal strategies to facilitate. Pt would benefit from ST intervention while in hospital and short term home health ST for memory and executive functions.      SLP Assessment  SLP Recommendation/Assessment: Patient needs continued Speech Lanaguage Pathology Services SLP Visit Diagnosis: Cognitive communication deficit (R41.841)    Follow Up Recommendations  Home health SLP    Frequency and Duration min 1 x/week  2 weeks      SLP Evaluation Cognition  Overall Cognitive Status: History of cognitive impairments - at baseline Arousal/Alertness: Awake/alert Orientation Level: Oriented X4 Attention: Sustained Sustained Attention: Appears intact Memory: Impaired Memory Impairment: Retrieval deficit Awareness: Appears intact Problem Solving: Appears intact Safety/Judgment: Appears intact       Comprehension  Auditory Comprehension Overall Auditory Comprehension: Appears within functional limits for tasks assessed Visual Recognition/Discrimination Discrimination: Not tested Reading Comprehension Reading Status: Not tested    Expression Expression Primary Mode of Expression: Verbal Verbal Expression Overall Verbal Expression: Impaired Initiation:  (dysfluent at times) Level of Generative/Spontaneous Verbalization: Conversation Repetition: Impaired Level of Impairment: Sentence level Naming: Not tested Pragmatics: No impairment Written Expression Dominant Hand: Right Written Expression: Not tested   Oral / Motor  Oral Motor/Sensory Function Overall Oral Motor/Sensory Function: Within functional limits Motor Speech Overall Motor Speech: Appears within functional limits for tasks assessed Respiration: Within functional limits Phonation:  Normal Resonance: Within functional limits Articulation: Within functional  limitis Intelligibility: Intelligible Motor Planning: Witnin functional limits   GO                    Kathleen Herrera 11/02/2016, 3:05 PM   Kathleen Herrera Kathleen Herrera.Ed [employer] [phone]

## 2016-11-02 NOTE — Progress Notes (Signed)
Physical Therapy Treatment Patient Details Name: Kathleen Herrera MRN: [id number] DOB: 04-Sep-1960 Today's Date: 11/02/2016    History of Present Illness Kathleen Herrera is an 56 y.o. female right-handed with past medical history of hypertension, hyperlipidemia, PTSD, depression/bipolar disorder, asthma who presented to [facility] for vertigo, concern for acute stroke. CT negative - pending MRI and rest of CVA work up.    PT Comments    Vestibular testing generally negative for any nystagmus with supine head roll and Hallpike-Dix.  Pt report mild vertigo with L hallpike so completed Epply for L post BPPV.  Occulomotor exam all negative/normal.   Follow Up Recommendations  Supervision - Intermittent     Equipment Recommendations  Rolling walker with 5" wheels    Recommendations for Other Services       Precautions / Restrictions Precautions Precautions: Fall Precaution Comments: History of falls. Reports fall secondary to dizziness.     Mobility  Bed Mobility Overal bed mobility: Needs Assistance       Supine to sit: Supervision Sit to supine: Supervision   General bed mobility comments: Supervision for safety.   Transfers Overall transfer level: Needs assistance   Transfers: Sit to/from Stand Sit to Stand: Supervision         General transfer comment: supervision for safety  Ambulation/Gait   Ambulation Distance (Feet): 250 Feet Assistive device:  (iv pole) Gait Pattern/deviations: Step-through pattern Gait velocity: Decreased Gait velocity interpretation: at or above normal speed for age/gender General Gait Details: steady and moderate speed   Stairs            Wheelchair Mobility    Modified Rankin (Stroke Patients Only)       Balance   Sitting-balance support: No upper extremity supported;Feet supported Sitting balance-Leahy Scale: Normal       Standing balance-Leahy Scale: Fair                              Cognition  Arousal/Alertness: Awake/alert Behavior During Therapy: WFL for tasks assessed/performed Overall Cognitive Status: Within Functional Limits for tasks assessed                                        Exercises      General Comments General comments (skin integrity, edema, etc.): Vestibular testing--Hallpike Dix negative for nystagmus and generally negative for symptoms.  pt did report mild dizziness with L Hall pike so completed Epply for L post BPPV.  Supine head roll negative.  Occulomotor testing normal, neg for nystagmus, smooth saccades and pursuits.      Pertinent Vitals/Pain Pain Assessment: No/denies pain    Home Living                      Prior Function            PT Goals (current goals can now be found in the care plan section) Acute Rehab PT Goals Patient Stated Goal: to figure out what is going on PT Goal Formulation: With patient Time For Goal Achievement: 11/08/16 Potential to Achieve Goals: Good Progress towards PT goals: Progressing toward goals    Frequency    Min 3X/week      PT Plan Current plan remains appropriate    Co-evaluation              AM-PAC PT "6  Clicks" Daily Activity  Outcome Measure  Difficulty turning over in bed (including adjusting bedclothes, sheets and blankets)?: None Difficulty moving from lying on back to sitting on the side of the bed? : None Difficulty sitting down on and standing up from a chair with arms (e.g., wheelchair, bedside commode, etc,.)?: A Little Help needed moving to and from a bed to chair (including a wheelchair)?: A Little Help needed walking in hospital room?: A Little Help needed climbing 3-5 steps with a railing? : A Little 6 Click Score: 20    End of Session   Activity Tolerance: Patient tolerated treatment well Patient left: in bed;with call bell/phone within reach;Other (comment) Nurse Communication: Mobility status PT Visit Diagnosis: Other symptoms and signs  involving the nervous system (R29.898);Other abnormalities of gait and mobility (R26.89)     Time: 1610-9604 PT Time Calculation (min) (ACUTE ONLY): 40 min  Charges:  $Gait Training: 8-22 mins $Neuromuscular Re-education: 8-22 mins $Canalith Rep Proc: 8-22 mins                    G Codes:       11/18/2016  [place] Bing, PT [phone]  (pager)   Kathleen Herrera 18-Nov-2016, 1:09 PM

## 2016-11-02 NOTE — Progress Notes (Signed)
PROGRESS NOTE    Kathleen Herrera  ZOX:[id number] DOB: 10-23-1960 DOA: 10/31/2016 PCP: Bing Neighbors, FNP    Brief Narrative:  56 y.o. female with history of hypertension, bipolar disorder, asthma presents to the ER with complaints of having persistent dizziness over the last few weeks. Patient states over the last 24 hours patient had 2 falls where patient was feeling dizzy and lost balance and fell. Denies hitting her head or losing consciousness. Patient came to the ER. Denies any palpitations shortness of breath focal weakness   Assessment & Plan:   Principal Problem:   Ataxia - Pt is currently undergoing work up by neurology - Urology suspecting maybe secondary to polypharmacy. Will cut Flexeril dosing in half - MRI is negative for acute stroke or central nervous system cause - Place order for PT evaluation   Active Problems:   Essential hypertension -  Patient is on Amlodipine, metoprolol    Hyperlipidemia - Stable no chest pain reported    Bipolar 2 disorder (HCC) - Continue Paxil    Asthma - Continue Dulera, albuterol as needed    Episodic recurrent vertigo - Obtaining PT evaluation    Dermatitis   DVT prophylaxis: Lovenox Code Status: Full Family Communication: None at bedside. Disposition Plan: pending PT evaluation and final recommendations from Neuro   Consultants:   Neurology   Procedures: stroke w/u   Antimicrobials:none   Subjective: Pt has no new complaints. No acute issues overnight.  Objective: Vitals:   11/02/16 0808 11/02/16 0815 11/02/16 0959 11/02/16 1428  BP: 124/69  120/62 (!) 102/51  Pulse: 65  66 61  Resp:   (!) 24 (!) 24  Temp:   99 F (37.2 C) 98.3 F (36.8 C)  TempSrc:   Oral Oral  SpO2:   100% 98%  Weight:  112.9 kg (249 lb)    Height:   (1.6 m)      Intake/Output Summary (Last 24 hours) at 11/02/16 1741 Last data filed at 11/02/16 1631  Gross per 24 hour  Intake              560 ml  Output              1860 ml  Net            -1300 ml   Filed Weights   11/02/16 0815  Weight: 112.9 kg (249 lb)    Examination:  General exam: Appears calm and comfortable, in nad.  Respiratory system: Clear to auscultation. Respiratory effort normal.equal chest rise. Cardiovascular system: S1 & S2 heard, RRR. No JVD, murmurs, rubs, gallops or clicks.  Gastrointestinal system: Abdomen is nondistended, soft and nontender. No organomegaly or masses felt. Normal bowel sounds heard. Central nervous system: Alert and oriented. No focal neurological deficits. Extremities: Symmetric 5 x 5 power. Skin: No rashes, lesions or ulcers, on limited exam. Psychiatry: Judgement and insight appear normal. Mood & affect appropriate.     Data Reviewed: I have personally reviewed following labs and imaging studies  CBC:  Recent Labs Lab 10/31/16 2245 11/01/16 0539  WBC 9.1 8.9  NEUTROABS 5.1  --   HGB 13.4 13.4  HCT 41.9 41.4  MCV 81.2 81.5  PLT 292 276   Basic Metabolic Panel:  Recent Labs Lab 10/31/16 2245 11/01/16 0539  NA 140  --   K 3.5  --   CL 103  --   CO2 28  --   GLUCOSE 111*  --   BUN 13  --  CREATININE 0.84 0.83  CALCIUM 9.1  --    GFR: Estimated Creatinine Clearance: 90.4 mL/min (by C-G formula based on SCr of 0.83 mg/dL). Liver Function Tests:  Recent Labs Lab 10/31/16 2245  AST 18  ALT 20  ALKPHOS 64  BILITOT 0.5  PROT 7.4  ALBUMIN 3.9   No results for input(s): LIPASE, AMYLASE in the last 168 hours. No results for input(s): AMMONIA in the last 168 hours. Coagulation Profile: No results for input(s): INR, PROTIME in the last 168 hours. Cardiac Enzymes: No results for input(s): CKTOTAL, CKMB, CKMBINDEX, TROPONINI in the last 168 hours. BNP (last 3 results) No results for input(s): PROBNP in the last 8760 hours. HbA1C:  Recent Labs  11/01/16 0539  HGBA1C 6.4*   CBG:  Recent Labs Lab 10/31/16 2241  GLUCAP 91   Lipid Profile:  Recent Labs   11/01/16 0539  CHOL 204*  HDL 64  LDLCALC 87  TRIG 266*  CHOLHDL 3.2   Thyroid Function Tests: No results for input(s): TSH, T4TOTAL, FREET4, T3FREE, THYROIDAB in the last 72 hours. Anemia Panel: No results for input(s): VITAMINB12, FOLATE, FERRITIN, TIBC, IRON, RETICCTPCT in the last 72 hours. Sepsis Labs: No results for input(s): PROCALCITON, LATICACIDVEN in the last 168 hours.  No results found for this or any previous visit (from the past 240 hour(s)).       Radiology Studies: Ct Angio Head W Or Wo Contrast  Result Date: 11/01/2016 CLINICAL DATA:  56 y/o  F; sudden onset dizziness, suspected stroke. EXAM: CT ANGIOGRAPHY HEAD AND NECK TECHNIQUE: Multidetector CT imaging of the head and neck was performed using the standard protocol during bolus administration of intravenous contrast. Multiplanar CT image reconstructions and MIPs were obtained to evaluate the vascular anatomy. Carotid stenosis measurements (when applicable) are obtained utilizing NASCET criteria, using the distal internal carotid diameter as the denominator. CONTRAST:  100 cc Isovue 370 COMPARISON:  11/01/2016 CT of the head FINDINGS: CTA NECK FINDINGS Aortic arch: Standard branching. Imaged portion shows no evidence of aneurysm or dissection. No significant stenosis of the major arch vessel origins. Calcific atherosclerosis. Right carotid system: No evidence of dissection, stenosis (50% or greater) or occlusion. Mild calcified plaque of the carotid bifurcations. Retropharyngeal course of upper CCA. Mid cervical ICA hairpin turn. Left carotid system: No evidence of dissection, stenosis (50% or greater) or occlusion. Mild calcific plaque of carotid bifurcations. Retropharyngeal course of upper CCA. Vertebral arteries: Left dominant. No evidence of dissection, stenosis (50% or greater) or occlusion. Skeleton: Negative. Other neck: Negative. Upper chest: Negative. Review of the MIP images confirms the above findings CTA HEAD  FINDINGS Anterior circulation: No significant stenosis, proximal occlusion, aneurysm, or vascular malformation. Mild calcified plaque of carotid siphons. Posterior circulation: No significant stenosis, proximal occlusion, aneurysm, or vascular malformation. Venous sinuses: As permitted by contrast timing, patent. Anatomic variants: Right fetal PCA. Patent anterior and posterior communicating arteries. Delayed phase: No abnormal intracranial enhancement. Review of the MIP images confirms the above findings IMPRESSION: 1. Patent carotid and vertebral arteries. No dissection, aneurysm, or hemodynamically significant stenosis utilizing NASCET criteria. 2. Patent circle of Willis. No large vessel occlusion, aneurysm, or significant stenosis. 3. Mild calcified atherosclerotic plaque of carotid siphons, carotid bifurcations, and aortic arch. Electronically Signed   By: Mitzi Hansen M.D.   On: 11/01/2016 06:38   Ct Head Wo Contrast  Result Date: 11/01/2016 CLINICAL DATA:  Sudden onset of dizziness. EXAM: CT HEAD WITHOUT CONTRAST TECHNIQUE: Contiguous axial images were obtained from the base  of the skull through the vertex without intravenous contrast. COMPARISON:  None. FINDINGS: Brain: Brain volume is normal for age. No intracranial hemorrhage, mass effect, or midline shift. No hydrocephalus. The basilar cisterns are patent. No evidence of territorial infarct or acute ischemia. No extra-axial or intracranial fluid collection. Vascular: No hyperdense vessel or unexpected calcification. Skull: No fracture or focal lesion. Sinuses/Orbits: Paranasal sinuses and mastoid air cells are clear. The visualized orbits are unremarkable. Other: None. IMPRESSION: No acute intracranial abnormality. Electronically Signed   By: Rubye Oaks M.D.   On: 11/01/2016 01:01   Ct Angio Neck W And/or Wo Contrast  Result Date: 11/01/2016 CLINICAL DATA:  57 y/o  F; sudden onset dizziness, suspected stroke. EXAM: CT  ANGIOGRAPHY HEAD AND NECK TECHNIQUE: Multidetector CT imaging of the head and neck was performed using the standard protocol during bolus administration of intravenous contrast. Multiplanar CT image reconstructions and MIPs were obtained to evaluate the vascular anatomy. Carotid stenosis measurements (when applicable) are obtained utilizing NASCET criteria, using the distal internal carotid diameter as the denominator. CONTRAST:  100 cc Isovue 370 COMPARISON:  11/01/2016 CT of the head FINDINGS: CTA NECK FINDINGS Aortic arch: Standard branching. Imaged portion shows no evidence of aneurysm or dissection. No significant stenosis of the major arch vessel origins. Calcific atherosclerosis. Right carotid system: No evidence of dissection, stenosis (50% or greater) or occlusion. Mild calcified plaque of the carotid bifurcations. Retropharyngeal course of upper CCA. Mid cervical ICA hairpin turn. Left carotid system: No evidence of dissection, stenosis (50% or greater) or occlusion. Mild calcific plaque of carotid bifurcations. Retropharyngeal course of upper CCA. Vertebral arteries: Left dominant. No evidence of dissection, stenosis (50% or greater) or occlusion. Skeleton: Negative. Other neck: Negative. Upper chest: Negative. Review of the MIP images confirms the above findings CTA HEAD FINDINGS Anterior circulation: No significant stenosis, proximal occlusion, aneurysm, or vascular malformation. Mild calcified plaque of carotid siphons. Posterior circulation: No significant stenosis, proximal occlusion, aneurysm, or vascular malformation. Venous sinuses: As permitted by contrast timing, patent. Anatomic variants: Right fetal PCA. Patent anterior and posterior communicating arteries. Delayed phase: No abnormal intracranial enhancement. Review of the MIP images confirms the above findings IMPRESSION: 1. Patent carotid and vertebral arteries. No dissection, aneurysm, or hemodynamically significant stenosis utilizing  NASCET criteria. 2. Patent circle of Willis. No large vessel occlusion, aneurysm, or significant stenosis. 3. Mild calcified atherosclerotic plaque of carotid siphons, carotid bifurcations, and aortic arch. Electronically Signed   By: Mitzi Hansen M.D.   On: 11/01/2016 06:38   Mr Brain Wo Contrast  Result Date: 11/01/2016 CLINICAL DATA:  Focal neurologic deficit for less than 6 hours EXAM: MRI HEAD WITHOUT CONTRAST TECHNIQUE: Multiplanar, multiecho pulse sequences of the brain and surrounding structures were obtained without intravenous contrast. COMPARISON:  CTA head and neck 11/01/2016 FINDINGS: Brain: The midline structures are normal. There is no focal diffusion restriction to indicate acute infarct. The brain parenchymal signal is normal and there is no mass lesion. No intraparenchymal hematoma or chronic microhemorrhage. Brain volume is normal for age without lobar predominant atrophy. The dura is normal and there is no extra-axial collection. Vascular: Major intracranial arterial and venous sinus flow voids are preserved. Skull and upper cervical spine: The visualized skull base, calvarium, upper cervical spine and extracranial soft tissues are normal. Sinuses/Orbits: No fluid levels or advanced mucosal thickening. No mastoid or middle ear effusion. Normal orbits. IMPRESSION: Normal MRI of the brain. Electronically Signed   By: Deatra Robinson M.D.   On: 11/01/2016  22:30        Scheduled Meds: . amLODipine  10 mg Oral Daily  . aspirin  300 mg Rectal Daily   Or  . aspirin  325 mg Oral Daily  . enoxaparin (LOVENOX) injection  40 mg Subcutaneous Daily  . fluocinonide ointment   Topical BID  . gabapentin  300 mg Oral TID  . lamoTRIgine  25 mg Oral Daily  . lithium carbonate  300 mg Oral BID WC  . metoprolol succinate  100 mg Oral Daily  . mometasone-formoterol  2 puff Inhalation BID  . oxybutynin  5 mg Oral QHS  . pantoprazole  40 mg Oral BID AC  . PARoxetine  20 mg Oral Daily   . predniSONE  20 mg Oral Q breakfast  . simvastatin  20 mg Oral q1800   Continuous Infusions:   LOS: 1 day    Time spent: 35 min    Penny Pia, MD [facility] Pager [phone]  If 7PM-7AM, please contact night-coverage [email or web address] Password [facility] 11/02/2016, 5:41 PM

## 2016-11-02 NOTE — Progress Notes (Signed)
STROKE TEAM PROGRESS NOTE   SUBJECTIVE (INTERVAL HISTORY) No family is at the bedside. Pt stated that she was sitting beside sink washing her face and brushing her teeth this am and she had another episode of vertigo, b/l leg felt weak, she had to hold on to the sink and sat down. It resolved after she lay in bed. Together with PT assistance, did Dix-Hallpike test, which was negative. She denies HA or recent stress.   OBJECTIVE Temp:  [98.1 F (36.7 C)-99 F (37.2 C)] 98.1 F (36.7 C) (09/28 1806) Pulse Rate:  [61-71] 61 (09/28 1428) Cardiac Rhythm: Normal sinus rhythm (09/28 0728) Resp:  [18-24] 20 (09/28 1806) BP: (102-131)/(51-70) 120/66 (09/28 1806) SpO2:  [96 %-100 %] 98 % (09/28 1428) Weight:  [249 lb (112.9 kg)] 249 lb (112.9 kg) (09/28 0815)  CBC:   Recent Labs Lab 10/31/16 2245 11/01/16 0539  WBC 9.1 8.9  NEUTROABS 5.1  --   HGB 13.4 13.4  HCT 41.9 41.4  MCV 81.2 81.5  PLT 292 276    Basic Metabolic Panel:   Recent Labs Lab 10/31/16 2245 11/01/16 0539  NA 140  --   K 3.5  --   CL 103  --   CO2 28  --   GLUCOSE 111*  --   BUN 13  --   CREATININE 0.84 0.83  CALCIUM 9.1  --     Lipid Panel:     Component Value Date/Time   CHOL 204 (H) 11/01/2016 0539   TRIG 266 (H) 11/01/2016 0539   HDL 64 11/01/2016 0539   CHOLHDL 3.2 11/01/2016 0539   VLDL 53 (H) 11/01/2016 0539   LDLCALC 87 11/01/2016 0539   HgbA1c:  Lab Results  Component Value Date   HGBA1C 6.4 (H) 11/01/2016   Urine Drug Screen:     Component Value Date/Time   LABOPIA NONE DETECTED 11/01/2016 0523   COCAINSCRNUR NONE DETECTED 11/01/2016 0523   LABBENZ NONE DETECTED 11/01/2016 0523   AMPHETMU NONE DETECTED 11/01/2016 0523   THCU NONE DETECTED 11/01/2016 0523   LABBARB NONE DETECTED 11/01/2016 0523    Alcohol Level No results found for: ETH  IMAGING I have personally reviewed the radiological images below and agree with the radiology interpretations.  Ct Angio Head W Or Wo  Contrast Ct Angio Neck W And/or Wo Contrast 11/01/2016 IMPRESSION: 1. Patent carotid and vertebral arteries. No dissection, aneurysm, or hemodynamically significant stenosis utilizing NASCET criteria. 2. Patent circle of Willis. No large vessel occlusion, aneurysm, or significant stenosis. 3. Mild calcified atherosclerotic plaque of carotid siphons, carotid bifurcations, and aortic arch.    Ct Head Wo Contrast 11/01/2016 IMPRESSION: No acute intracranial abnormality.   CTA Head/Neck 11/01/2016 IMPRESSION: 1. Patent carotid and vertebral arteries. No dissection, aneurysm, or hemodynamically significant stenosis utilizing NASCET criteria. 2. Patent circle of Willis. No large vessel occlusion, aneurysm, or significant stenosis. 3. Mild calcified atherosclerotic plaque of carotid siphons, carotid bifurcations, and aortic arch.  Mr Brain Wo Contrast 11/01/2016 IMPRESSION: Normal MRI of the brain.   TTE 11/01/2016 Study Conclusions - Left ventricle: The cavity size was normal. Wall thickness was normal. Systolic function was normal. The estimated ejection fraction was in the range of 55% to 60%. Wall motion was normal; there were no regional wall motion abnormalities. Left ventricular diastolic function parameters were normal. - Aortic valve: There was no stenosis. - Mitral valve: There was no significant regurgitation. - Left atrium: The atrium was mildly dilated. - Right ventricle: Poorly visualized. The  cavity size was normal.  Systolic function was normal. - Pulmonary arteries: No complete TR doppler jet so unable to estimate PA systolic pressure. - Inferior vena cava: The vessel was normal in size. The respirophasic diameter changes were in the normal range (>= 50%), consistent with normal central venous pressure. Impressions: - Normal LV size with EF 55-60%. Normal diastolic function. Normal RV size and systolic function. No significant valvular abnormalities.   PHYSICAL EXAM  Temp:   [98.1 F (36.7 C)-99 F (37.2 C)] 98.1 F (36.7 C) (09/28 1806) Pulse Rate:  [61-71] 61 (09/28 1428) Resp:  [18-24] 20 (09/28 1806) BP: (102-131)/(51-70) 120/66 (09/28 1806) SpO2:  [96 %-100 %] 98 % (09/28 1428) Weight:  [249 lb (112.9 kg)] 249 lb (112.9 kg) (09/28 0815)  General - morbid obesity, well developed, in no apparent distress.  Ophthalmologic - Fundi not visualized due to noncooperation.  Cardiovascular - Regular rate and rhythm.  Mental Status -  Level of arousal and orientation to time, place, and person were intact. Language including expression, naming, repetition, comprehension was assessed and found intact. Attention span and concentration were normal. Fund of Knowledge was assessed and was intact.  Cranial Nerves II - XII - II - Visual field intact OU. III, IV, VI - Extraocular movements intact. V - Facial sensation intact bilaterally. VII - Facial movement intact bilaterally. VIII - Hearing & vestibular intact bilaterally. X - Palate elevates symmetrically. XI - Chin turning & shoulder shrug intact bilaterally. XII - Tongue protrusion intact.  Motor Strength - The patient's strength was normal in all extremities and pronator drift was absent.  Bulk was normal and fasciculations were absent.   Motor Tone - Muscle tone was assessed at the neck and appendages and was normal.  Reflexes - The patient's reflexes were 1+ in all extremities and she had no pathological reflexes.  Sensory - Light touch, temperature/pinprick were assessed and were symmetrical.    Coordination - The patient had normal movements in the hands and feet with no ataxia or dysmetria.  Tremor was absent. Head thrust test negative. Dix-Hallpike negative.  Gait and Station - deferred   ASSESSMENT/PLAN [name] is a 56 y.o. female with history of hypertension, hyperlipidemia, PTSD, depression/bipolar disorder, asthma who presented to [facility] emergency room for sudden-onset  vertigo and bilateral leg weakness with fall while walking.  She did not receive IV t-PA due to arriving outside of the tPA treatment window.   Vertigo and the falling episodes x 3 - etiology unclear, history and exam do not support BPPV, vestibular neuritis, Mnire's disease, basilar type migraine. Her symptoms might be due to recent polypharmacy (Klonopin, Flexeril, gabapentin, and steroids) and potential vestibular proxymia. Anxiety related episodes also in DDx.  Resultant 3 episode of vertigo while at standing position.   CT head: no acute stroke  MRI head no acute stroke  CTA Head/Neck: No LVO or high-grade stenosis  2D Echo: EF 55-60%. No source of embolus  UDS negative  LDL 87  HgbA1c 6.4  Lovenox 40 mg sq daily for VTE prophylaxis Diet Heart Room service appropriate? Yes; Fluid consistency: Thin  No antithrombotic prior to admission, now on aspirin 325 mg daily. Continue aspirin on discharge.  Patient counseled to be compliant with her antithrombotic medications  Ongoing aggressive stroke risk factor management  Therapy recommendations: outpt PT  Disposition: will have Dr. Lucia Gaskins to have second opinion on her tomorrow.  Hypertension  Stable  Permissive hypertension (OK if < 220/120) but  gradually normalize in 5-7 days  Long-term BP goal normotensive  Hyperlipidemia  Home meds: Zocor 20, resumed in hospital  LDL 87, goal < 70  Continue Zocor at discharge  Prediabetes  HgbA1c 6.4, goal < 7.0  DM education provided  Tobacco abuse  Current smoker  Smoking cessation counseling provided  Pt is willing to quit  Other Stroke Risk Factors  Morbid obesity, counseled to lose weight  ETOH use, advised to drink no more than 2 drink(s) a day  Other Active Problems  Dermatitis, on topical and the by mouth steroids  Lower back pain on Flexeril and increased dose of gabapentin  Hospital day # 1  Marvel Plan, MD PhD Stroke Neurology 11/02/2016 6:10  PM  To contact Stroke Continuity provider, please refer to [email or web address]. After hours, contact General Neurology

## 2016-11-03 DIAGNOSIS — R270 Ataxia, unspecified: Principal | ICD-10-CM

## 2016-11-03 MED ORDER — ASPIRIN 325 MG PO TABS: 325.0000 mg | ORAL_TABLET | Freq: Every day | ORAL | 0 refills | Status: DC

## 2016-11-03 MED ORDER — CYCLOBENZAPRINE HCL 5 MG PO TABS: 5.0000 mg | ORAL_TABLET | Freq: Three times a day (TID) | ORAL | 0 refills | Status: DC | PRN

## 2016-11-03 NOTE — Progress Notes (Signed)
Kathleen Herrera to be D/C'd Home per MD order.  Discussed with the patient and all questions fully answered.  VSS, Skin clean, dry and intact without evidence of skin break down, no evidence of skin tears noted. IV catheter discontinued intact. Site without signs and symptoms of complications. Dressing and pressure applied.  An After Visit Summary was printed and given to the patient. Patient received prescription.  D/c education completed with patient/family including follow up instructions, medication list, d/c activities limitations if indicated, with other d/c instructions as indicated by MD - patient able to verbalize understanding, all questions fully answered.   Patient instructed to return to ED, call 911, or call MD for any changes in condition.   Patient escorted via WC, and D/C home via private auto.  Grayling Congress Elmor Kost 11/03/2016 6:08 PM

## 2016-11-03 NOTE — Discharge Summary (Signed)
Physician Discharge Summary  Kathleen Herrera ZOX:[id number] DOB: 1960/04/04 DOA: 10/31/2016  PCP: Bing Neighbors, FNP  Admit date: 10/31/2016 Discharge date: 11/03/2016  Time spent: > 35 minutes  Recommendations for Outpatient Follow-up:  1.    Discharge Diagnoses:  Principal Problem:   Ataxia Active Problems:   Essential hypertension   Hyperlipidemia   Bipolar 2 disorder (HCC)   Asthma   Episodic recurrent vertigo   Dermatitis   Discharge Condition: stable  Diet recommendation: Heart healthy  Filed Weights   11/02/16 0815  Weight: 112.9 kg (249 lb)    History of present illness:   56 y.o. female with history of hypertension, bipolar disorder, asthma presents to the ER with complaints of having persistent dizziness over the last few weeks. Patient states over the last 24 hours patient had 2 falls where patient was feeling dizzy and lost balance and fell. Denies hitting her head or losing consciousness. Patient came to the ER. Denies any palpitations shortness of breath focal weakness.  Hospital Course:  Principal Problem:   Ataxia - Pt underwent evaluation by neurology who recommended the following: Vertigo and the falling episodes x 3 - etiology unclear, history and exam do not support BPPV, vestibular neuritis, Mnire's disease, basilar type migraine. Her symptoms might be due to recent polypharmacy (Klonopin, Flexeril, gabapentin, and steroids) and potential vestibular proxymia. Anxiety related episodes also in DDx.  Resultant 3 episode of vertigo while at standing position.   CT head: no acute stroke  MRI head no acute stroke  CTA Head/Neck: No LVO or high-grade stenosis  2D Echo: EF 55-60%. No source of embolus  UDS negative  LDL 87  HgbA1c 6.4  Lovenox 40 mg sq daily for VTE prophylaxis  Diet Heart Room service appropriate? Yes; Fluid consistency: Thin  No antithrombotic prior to admission, now on aspirin 325 mg daily. Continue aspirin on  discharge.  Patient counseled to be compliant with her antithrombotic medications  Ongoing aggressive stroke risk factor management  Therapy recommendations: outpt PT  Disposition: will have Dr. Lucia Gaskins to have second opinion on her tomorrow.  Hypertension  Stable              Permissive hypertension (OK if < 220/120) but gradually normalize in 5-7 days              Long-term BP goal normotensive  Hyperlipidemia  Home meds: Zocor 20, resumed in hospital  LDL 87, goal < 70  Continue Zocor at discharge  Prediabetes  HgbA1c 6.4, goal < 7.0  DM education provided  Tobacco abuse  Current smoker  Smoking cessation counseling provided  Pt is willing to quit  Other Stroke Risk Factors  Morbid obesity, counseled to lose weight  ETOH use, advised to drink no more than 2 drink(s) a day  Other Active Problems  Dermatitis, on topical and the by mouth steroids  Lower back pain on Flexeril and increased dose of gabapentin  Active Problems:   Essential hypertension -  Patient is on Amlodipine, metoprolol    Hyperlipidemia - Stable no chest pain reported    Bipolar 2 disorder (HCC) - Continue Paxil    Asthma - Continue Dulera, albuterol as needed    Episodic recurrent vertigo - PT evaluation recommended walker    Dermatitis  Procedures: Ct Angio Head W Or Wo Contrast Ct Angio Neck W And/or Wo Contrast 11/01/2016 IMPRESSION: 1. Patent carotid and vertebral arteries. No dissection, aneurysm, or hemodynamically significant stenosis utilizing NASCET criteria. 2. Patent circle  of Willis. No large vessel occlusion, aneurysm, or significant stenosis. 3. Mild calcified atherosclerotic plaque of carotid siphons, carotid bifurcations, and aortic arch.    Ct Head Wo Contrast 11/01/2016 IMPRESSION: No acute intracranial abnormality.   CTA Head/Neck 11/01/2016 IMPRESSION: 1. Patent carotid and vertebral arteries. No dissection, aneurysm, or  hemodynamically significant stenosis utilizing NASCET criteria. 2. Patent circle of Willis. No large vessel occlusion, aneurysm, or significant stenosis. 3. Mild calcified atherosclerotic plaque of carotid siphons, carotid bifurcations, and aortic arch.  Mr Brain Wo Contrast 11/01/2016 IMPRESSION: Normal MRI of the brain.   TTE 11/01/2016 Study Conclusions - Left ventricle: The cavity size was normal. Wall thickness was normal. Systolic function was normal. The estimated ejection fraction was in the range of 55% to 60%. Wall motion was normal;there were no regional wall motion abnormalities. Left ventricular diastolic function parameters were normal. - Aortic valve: There was no stenosis. - Mitral valve: There was no significant regurgitation. - Left atrium: The atrium was mildly dilated. - Right ventricle: Poorly visualized. The cavity size was normal.Systolic function was normal. - Pulmonary arteries: No complete TR doppler jet so unable to estimate PA systolic pressure. - Inferior vena cava: The vessel was normal in size. The respirophasic diameter changes were in the normal range (>= 50%), consistent with normal central venous pressure. Impressions: - Normal LV size with EF 55-60%. Normal diastolic function. Normal RV size and systolic function. No significant valvular abnormalities.   Consultations:  Neurology  Discharge Exam: Vitals:   11/03/16 0824 11/03/16 0914  BP: 121/83   Pulse: (!) 59   Resp:    Temp:    SpO2:  96%    General: Pt in nad, alert and awake Cardiovascular: rrr, no rubs Respiratory: no increased wob, no wheezes  Discharge Instructions   Discharge Instructions    Call MD for:  difficulty breathing, headache or visual disturbances    Complete by:  As directed    Call MD for:  severe uncontrolled pain    Complete by:  As directed    Call MD for:  temperature >100.4    Complete by:  As directed    Diet - low sodium heart healthy    Complete  by:  As directed    Discharge instructions    Complete by:  As directed    Please follow up with your neurologist after hospital discharge.   Increase activity slowly    Complete by:  As directed      Current Discharge Medication List    START taking these medications   Details  aspirin 325 MG tablet Take 1 tablet (325 mg total) by mouth daily. Qty: 30 tablet, Refills: 0      CONTINUE these medications which have CHANGED   Details  cyclobenzaprine (FLEXERIL) 5 MG tablet Take 1 tablet (5 mg total) by mouth 3 (three) times daily as needed for muscle spasms. Qty: 30 tablet, Refills: 0      CONTINUE these medications which have NOT CHANGED   Details  albuterol (PROVENTIL HFA;VENTOLIN HFA) 108 (90 Base) MCG/ACT inhaler Inhale 2 puffs into the lungs every 6 (six) hours as needed for wheezing or shortness of breath. Qty: 1 Inhaler, Refills: 3    amLODipine (NORVASC) 10 MG tablet Take 1 tablet (10 mg total) by mouth daily. Qty: 90 tablet, Refills: 2   Associated Diagnoses: Essential hypertension    betamethasone dipropionate (DIPROLENE) 0.05 % ointment Apply topically 2 (two) times daily. Qty: 30 g, Refills: 0  budesonide-formoterol (SYMBICORT) 80-4.5 MCG/ACT inhaler Inhale 2 puffs into the lungs 2 (two) times daily. Qty: 1 Inhaler, Refills: 3    clonazePAM (KLONOPIN) 0.5 MG tablet Take 1 mg by mouth 3 (three) times daily as needed for anxiety.     clotrimazole-betamethasone (LOTRISONE) cream Apply 1 application topically 2 (two) times daily. Groin area. Qty: 90 g, Refills: 0    furosemide (LASIX) 20 MG tablet Take 1 tablet (20 mg total) by mouth daily. Qty: 30 tablet, Refills: 3   Associated Diagnoses: Pedal edema; Essential hypertension    gabapentin (NEURONTIN) 300 MG capsule Take 1 capsule (300 mg total) by mouth 3 (three) times daily. Qty: 90 capsule, Refills: 3    lamoTRIgine (LAMICTAL) 25 MG tablet Take 25 mg by mouth daily.     lithium carbonate 300 MG capsule  Take 300 mg by mouth 2 (two) times daily with a meal.     metoprolol succinate (TOPROL-XL) 50 MG 24 hr tablet Take 2 tablets (100 mg total) by mouth daily. Take with or immediately following a meal. Qty: 180 tablet, Refills: 2   Associated Diagnoses: Essential hypertension    Nystatin POWD Apply 2 sprinkles of powder to the affected inner thighs to prevent itching and fungal rash Qty: 1 Bottle, Refills: 2    oxybutynin (DITROPAN XL) 5 MG 24 hr tablet Take 1 tablet (5 mg total) by mouth at bedtime. Qty: 90 tablet, Refills: 2    pantoprazole (PROTONIX) 40 MG tablet Take 1 tablet (40 mg total) by mouth 2 (two) times daily before a meal. Qty: 60 tablet, Refills: 6    PARoxetine (PAXIL) 20 MG tablet TAKE 1 TABLET (20 MG TOTAL) BY MOUTH 1 DAY OR 1 DOSE. Qty: 30 tablet, Refills: 3    predniSONE (DELTASONE) 20 MG tablet Take 1 tablet (20 mg total) by mouth daily with breakfast. Qty: 10 tablet, Refills: 0    simvastatin (ZOCOR) 20 MG tablet TAKE ONE TABLET   (20 MG TOTAL ) BY MOUTH   AT BEDTIME Qty: 90 tablet, Refills: 1   Associated Diagnoses: Pure hypercholesterolemia    solifenacin (VESICARE) 5 MG tablet Take 1 tablet (5 mg total) by mouth daily. Qty: 90 tablet, Refills: 3      STOP taking these medications     cephALEXin (KEFLEX) 500 MG capsule        No Known Allergies    The results of significant diagnostics from this hospitalization (including imaging, microbiology, ancillary and laboratory) are listed below for reference.    Significant Diagnostic Studies: Ct Angio Head W Or Wo Contrast  Result Date: 11/01/2016 CLINICAL DATA:  56 y/o  F; sudden onset dizziness, suspected stroke. EXAM: CT ANGIOGRAPHY HEAD AND NECK TECHNIQUE: Multidetector CT imaging of the head and neck was performed using the standard protocol during bolus administration of intravenous contrast. Multiplanar CT image reconstructions and MIPs were obtained to evaluate the vascular anatomy. Carotid stenosis  measurements (when applicable) are obtained utilizing NASCET criteria, using the distal internal carotid diameter as the denominator. CONTRAST:  100 cc Isovue 370 COMPARISON:  11/01/2016 CT of the head FINDINGS: CTA NECK FINDINGS Aortic arch: Standard branching. Imaged portion shows no evidence of aneurysm or dissection. No significant stenosis of the major arch vessel origins. Calcific atherosclerosis. Right carotid system: No evidence of dissection, stenosis (50% or greater) or occlusion. Mild calcified plaque of the carotid bifurcations. Retropharyngeal course of upper CCA. Mid cervical ICA hairpin turn. Left carotid system: No evidence of dissection, stenosis (50% or greater)  or occlusion. Mild calcific plaque of carotid bifurcations. Retropharyngeal course of upper CCA. Vertebral arteries: Left dominant. No evidence of dissection, stenosis (50% or greater) or occlusion. Skeleton: Negative. Other neck: Negative. Upper chest: Negative. Review of the MIP images confirms the above findings CTA HEAD FINDINGS Anterior circulation: No significant stenosis, proximal occlusion, aneurysm, or vascular malformation. Mild calcified plaque of carotid siphons. Posterior circulation: No significant stenosis, proximal occlusion, aneurysm, or vascular malformation. Venous sinuses: As permitted by contrast timing, patent. Anatomic variants: Right fetal PCA. Patent anterior and posterior communicating arteries. Delayed phase: No abnormal intracranial enhancement. Review of the MIP images confirms the above findings IMPRESSION: 1. Patent carotid and vertebral arteries. No dissection, aneurysm, or hemodynamically significant stenosis utilizing NASCET criteria. 2. Patent circle of Willis. No large vessel occlusion, aneurysm, or significant stenosis. 3. Mild calcified atherosclerotic plaque of carotid siphons, carotid bifurcations, and aortic arch. Electronically Signed   By: Mitzi Hansen M.D.   On: 11/01/2016 06:38   Ct  Head Wo Contrast  Result Date: 11/01/2016 CLINICAL DATA:  Sudden onset of dizziness. EXAM: CT HEAD WITHOUT CONTRAST TECHNIQUE: Contiguous axial images were obtained from the base of the skull through the vertex without intravenous contrast. COMPARISON:  None. FINDINGS: Brain: Brain volume is normal for age. No intracranial hemorrhage, mass effect, or midline shift. No hydrocephalus. The basilar cisterns are patent. No evidence of territorial infarct or acute ischemia. No extra-axial or intracranial fluid collection. Vascular: No hyperdense vessel or unexpected calcification. Skull: No fracture or focal lesion. Sinuses/Orbits: Paranasal sinuses and mastoid air cells are clear. The visualized orbits are unremarkable. Other: None. IMPRESSION: No acute intracranial abnormality. Electronically Signed   By: Rubye Oaks M.D.   On: 11/01/2016 01:01   Ct Angio Neck W And/or Wo Contrast  Result Date: 11/01/2016 CLINICAL DATA:  56 y/o  F; sudden onset dizziness, suspected stroke. EXAM: CT ANGIOGRAPHY HEAD AND NECK TECHNIQUE: Multidetector CT imaging of the head and neck was performed using the standard protocol during bolus administration of intravenous contrast. Multiplanar CT image reconstructions and MIPs were obtained to evaluate the vascular anatomy. Carotid stenosis measurements (when applicable) are obtained utilizing NASCET criteria, using the distal internal carotid diameter as the denominator. CONTRAST:  100 cc Isovue 370 COMPARISON:  11/01/2016 CT of the head FINDINGS: CTA NECK FINDINGS Aortic arch: Standard branching. Imaged portion shows no evidence of aneurysm or dissection. No significant stenosis of the major arch vessel origins. Calcific atherosclerosis. Right carotid system: No evidence of dissection, stenosis (50% or greater) or occlusion. Mild calcified plaque of the carotid bifurcations. Retropharyngeal course of upper CCA. Mid cervical ICA hairpin turn. Left carotid system: No evidence of  dissection, stenosis (50% or greater) or occlusion. Mild calcific plaque of carotid bifurcations. Retropharyngeal course of upper CCA. Vertebral arteries: Left dominant. No evidence of dissection, stenosis (50% or greater) or occlusion. Skeleton: Negative. Other neck: Negative. Upper chest: Negative. Review of the MIP images confirms the above findings CTA HEAD FINDINGS Anterior circulation: No significant stenosis, proximal occlusion, aneurysm, or vascular malformation. Mild calcified plaque of carotid siphons. Posterior circulation: No significant stenosis, proximal occlusion, aneurysm, or vascular malformation. Venous sinuses: As permitted by contrast timing, patent. Anatomic variants: Right fetal PCA. Patent anterior and posterior communicating arteries. Delayed phase: No abnormal intracranial enhancement. Review of the MIP images confirms the above findings IMPRESSION: 1. Patent carotid and vertebral arteries. No dissection, aneurysm, or hemodynamically significant stenosis utilizing NASCET criteria. 2. Patent circle of Willis. No large vessel occlusion, aneurysm, or significant  stenosis. 3. Mild calcified atherosclerotic plaque of carotid siphons, carotid bifurcations, and aortic arch. Electronically Signed   By: Mitzi Hansen M.D.   On: 11/01/2016 06:38   Mr Brain Wo Contrast  Result Date: 11/01/2016 CLINICAL DATA:  Focal neurologic deficit for less than 6 hours EXAM: MRI HEAD WITHOUT CONTRAST TECHNIQUE: Multiplanar, multiecho pulse sequences of the brain and surrounding structures were obtained without intravenous contrast. COMPARISON:  CTA head and neck 11/01/2016 FINDINGS: Brain: The midline structures are normal. There is no focal diffusion restriction to indicate acute infarct. The brain parenchymal signal is normal and there is no mass lesion. No intraparenchymal hematoma or chronic microhemorrhage. Brain volume is normal for age without lobar predominant atrophy. The dura is normal and  there is no extra-axial collection. Vascular: Major intracranial arterial and venous sinus flow voids are preserved. Skull and upper cervical spine: The visualized skull base, calvarium, upper cervical spine and extracranial soft tissues are normal. Sinuses/Orbits: No fluid levels or advanced mucosal thickening. No mastoid or middle ear effusion. Normal orbits. IMPRESSION: Normal MRI of the brain. Electronically Signed   By: Deatra Robinson M.D.   On: 11/01/2016 22:30    Microbiology: No results found for this or any previous visit (from the past 240 hour(s)).   Labs: Basic Metabolic Panel:  Recent Labs Lab 10/31/16 2245 11/01/16 0539  NA 140  --   K 3.5  --   CL 103  --   CO2 28  --   GLUCOSE 111*  --   BUN 13  --   CREATININE 0.84 0.83  CALCIUM 9.1  --    Liver Function Tests:  Recent Labs Lab 10/31/16 2245  AST 18  ALT 20  ALKPHOS 64  BILITOT 0.5  PROT 7.4  ALBUMIN 3.9   No results for input(s): LIPASE, AMYLASE in the last 168 hours. No results for input(s): AMMONIA in the last 168 hours. CBC:  Recent Labs Lab 10/31/16 2245 11/01/16 0539  WBC 9.1 8.9  NEUTROABS 5.1  --   HGB 13.4 13.4  HCT 41.9 41.4  MCV 81.2 81.5  PLT 292 276   Cardiac Enzymes: No results for input(s): CKTOTAL, CKMB, CKMBINDEX, TROPONINI in the last 168 hours. BNP: BNP (last 3 results) No results for input(s): BNP in the last 8760 hours.  ProBNP (last 3 results) No results for input(s): PROBNP in the last 8760 hours.  CBG:  Recent Labs Lab 10/31/16 2241  GLUCAP 91    Signed:  Penny Pia MD.  [facility] 11/03/2016, 5:03 PM

## 2016-11-03 NOTE — Progress Notes (Signed)
STROKE TEAM PROGRESS NOTE   SUBJECTIVE (INTERVAL HISTORY) No family is at the bedside.     OBJECTIVE Temp:  [97.9 F (36.6 C)-99 F (37.2 C)] 98 F (36.7 C) (09/29 0445) Pulse Rate:  [61-66] 62 (09/29 0445) Cardiac Rhythm: Normal sinus rhythm (09/29 0700) Resp:  [18-24] 18 (09/29 0445) BP: (102-120)/(51-70) 117/70 (09/29 0445) SpO2:  [96 %-100 %] 99 % (09/29 0445)  CBC:   Recent Labs Lab 10/31/16 2245 11/01/16 0539  WBC 9.1 8.9  NEUTROABS 5.1  --   HGB 13.4 13.4  HCT 41.9 41.4  MCV 81.2 81.5  PLT 292 276    Basic Metabolic Panel:   Recent Labs Lab 10/31/16 2245 11/01/16 0539  NA 140  --   K 3.5  --   CL 103  --   CO2 28  --   GLUCOSE 111*  --   BUN 13  --   CREATININE 0.84 0.83  CALCIUM 9.1  --     Lipid Panel:     Component Value Date/Time   CHOL 204 (H) 11/01/2016 0539   TRIG 266 (H) 11/01/2016 0539   HDL 64 11/01/2016 0539   CHOLHDL 3.2 11/01/2016 0539   VLDL 53 (H) 11/01/2016 0539   LDLCALC 87 11/01/2016 0539   HgbA1c:  Lab Results  Component Value Date   HGBA1C 6.4 (H) 11/01/2016   Urine Drug Screen:     Component Value Date/Time   LABOPIA NONE DETECTED 11/01/2016 0523   COCAINSCRNUR NONE DETECTED 11/01/2016 0523   LABBENZ NONE DETECTED 11/01/2016 0523   AMPHETMU NONE DETECTED 11/01/2016 0523   THCU NONE DETECTED 11/01/2016 0523   LABBARB NONE DETECTED 11/01/2016 0523    Alcohol Level No results found for: ETH  IMAGING I have personally reviewed the radiological images below and agree with the radiology interpretations.  Ct Angio Head W Or Wo Contrast Ct Angio Neck W And/or Wo Contrast 11/01/2016 IMPRESSION: 1. Patent carotid and vertebral arteries. No dissection, aneurysm, or hemodynamically significant stenosis utilizing NASCET criteria. 2. Patent circle of Willis. No large vessel occlusion, aneurysm, or significant stenosis. 3. Mild calcified atherosclerotic plaque of carotid siphons, carotid bifurcations, and aortic arch.     Ct Head Wo Contrast 11/01/2016 IMPRESSION: No acute intracranial abnormality.   CTA Head/Neck 11/01/2016 IMPRESSION: 1. Patent carotid and vertebral arteries. No dissection, aneurysm, or hemodynamically significant stenosis utilizing NASCET criteria. 2. Patent circle of Willis. No large vessel occlusion, aneurysm, or significant stenosis. 3. Mild calcified atherosclerotic plaque of carotid siphons, carotid bifurcations, and aortic arch.  Mr Brain Wo Contrast 11/01/2016 IMPRESSION: Normal MRI of the brain.   TTE 11/01/2016 Study Conclusions - Left ventricle: The cavity size was normal. Wall thickness was normal. Systolic function was normal. The estimated ejection fraction was in the range of 55% to 60%. Wall motion was normal; there were no regional wall motion abnormalities. Left ventricular diastolic function parameters were normal. - Aortic valve: There was no stenosis. - Mitral valve: There was no significant regurgitation. - Left atrium: The atrium was mildly dilated. - Right ventricle: Poorly visualized. The cavity size was normal.  Systolic function was normal. - Pulmonary arteries: No complete TR doppler jet so unable to estimate PA systolic pressure. - Inferior vena cava: The vessel was normal in size. The respirophasic diameter changes were in the normal range (>= 50%), consistent with normal central venous pressure. Impressions: - Normal LV size with EF 55-60%. Normal diastolic function. Normal RV size and systolic function. No significant valvular abnormalities.  PHYSICAL EXAM  Temp:  [97.9 F (36.6 C)-99 F (37.2 C)] 98 F (36.7 C) (09/29 0445) Pulse Rate:  [61-66] 62 (09/29 0445) Resp:  [18-24] 18 (09/29 0445) BP: (102-120)/(51-70) 117/70 (09/29 0445) SpO2:  [96 %-100 %] 99 % (09/29 0445)  General - morbid obesity, well developed, in no apparent distress.  Ophthalmologic - Fundi not visualized due to noncooperation.  Cardiovascular - Regular rate and  rhythm.  Mental Status -  Level of arousal and orientation to time, place, and person were intact. Language including expression, naming, repetition, comprehension was assessed and found intact. Attention span and concentration were normal. Fund of Knowledge was assessed and was intact.  Cranial Nerves II - XII - II - Visual field intact OU. III, IV, VI - Extraocular movements intact. V - Facial sensation intact bilaterally. VII - Facial movement intact bilaterally. VIII - Hearing & vestibular intact bilaterally. X - Palate elevates symmetrically. XI - Chin turning & shoulder shrug intact bilaterally. XII - Tongue protrusion intact.  Motor Strength - The patient's strength was normal in all extremities and pronator drift was absent.  Bulk was normal and fasciculations were absent.   Motor Tone - Muscle tone was assessed at the neck and appendages and was normal.  Reflexes - The patient's reflexes were 1+ in all extremities and she had no pathological reflexes.  Sensory - Light touch, temperature/pinprick were assessed and were symmetrical.    Coordination - The patient had normal movements in the hands and feet with no ataxia or dysmetria.  Tremor was absent. Head thrust test negative. Dix-Hallpike negative.  Gait and Station - deferred   ASSESSMENT/PLAN [name] is a 56 y.o. female with history of hypertension, hyperlipidemia, PTSD, depression/bipolar disorder, asthma who presented to [facility] emergency room for sudden-onset vertigo and bilateral leg weakness with fall while walking.  She did not receive IV t-PA due to arriving outside of the tPA treatment window.   Vertigo and the falling episodes x 3 - etiology unclear, history and exam do not support BPPV, vestibular neuritis, Mnire's disease, basilar type migraine. Her symptoms might be due to recent polypharmacy (Klonopin, Flexeril, gabapentin, and steroids) and potential vestibular proxymia. Anxiety related  episodes also in DDx.  Resultant 3 episode of vertigo while at standing position.   CT head: no acute stroke  MRI head no acute stroke  CTA Head/Neck: No LVO or high-grade stenosis  2D Echo: EF 55-60%. No source of embolus  UDS negative  LDL 87  HgbA1c 6.4  Lovenox 40 mg sq daily for VTE prophylaxis Diet Heart Room service appropriate? Yes; Fluid consistency: Thin  No antithrombotic prior to admission, now on aspirin 325 mg daily. Continue aspirin on discharge.  Patient counseled to be compliant with her antithrombotic medications  Ongoing aggressive stroke risk factor management  Therapy recommendations: outpt PT  Disposition: Follow up with Dr. Lucia Gaskins outpatient  Hypertension  Stable  Permissive hypertension (OK if < 220/120) but gradually normalize in 5-7 days  Long-term BP goal normotensive  Hyperlipidemia  Home meds: Zocor 20, resumed in hospital  LDL 87, goal < 70  Continue Zocor at discharge  Prediabetes  HgbA1c 6.4, goal < 7.0  DM education provided  Tobacco abuse  Current smoker  Smoking cessation counseling provided  Pt is willing to quit  Other Stroke Risk Factors  Morbid obesity, counseled to lose weight  ETOH use, advised to drink no more than 2 drink(s) a day  Other Active Problems  Dermatitis,  on topical and the by mouth steroids  Lower back pain on Flexeril and increased dose of gabapentin  Hospital day # 2  Stroke will sign off on patient, will schedule her for outpatient follow up with Dr. Lucia Gaskins  To contact Stroke Continuity provider, please refer to [email or web address]. After hours, contact General Neurology

## 2016-11-09 ENCOUNTER — Other Ambulatory Visit: Payer: Self-pay | Admitting: Family Medicine

## 2016-11-14 ENCOUNTER — Emergency Department (HOSPITAL_COMMUNITY)
Admission: EM | Admit: 2016-11-14 | Discharge: 2016-11-15 | Disposition: A | Discharge status: Home / Self Care | Payer: Medicaid Other | Attending: Emergency Medicine | Admitting: Emergency Medicine

## 2016-11-14 ENCOUNTER — Other Ambulatory Visit: Payer: Self-pay

## 2016-11-14 DIAGNOSIS — Z79899 Other long term (current) drug therapy: Secondary | ICD-10-CM | POA: Insufficient documentation

## 2016-11-14 DIAGNOSIS — R531 Weakness: Secondary | ICD-10-CM | POA: Diagnosis present

## 2016-11-14 DIAGNOSIS — J45909 Unspecified asthma, uncomplicated: Secondary | ICD-10-CM | POA: Diagnosis not present

## 2016-11-14 DIAGNOSIS — I10 Essential (primary) hypertension: Secondary | ICD-10-CM | POA: Insufficient documentation

## 2016-11-14 DIAGNOSIS — F17210 Nicotine dependence, cigarettes, uncomplicated: Secondary | ICD-10-CM | POA: Insufficient documentation

## 2016-11-14 DIAGNOSIS — R42 Dizziness and giddiness: Secondary | ICD-10-CM | POA: Diagnosis not present

## 2016-11-14 LAB — CBC
HCT: 44.6 % (ref 36.0–46.0)
Hemoglobin: 14.3 g/dL (ref 12.0–15.0)
MCH: 26.4 pg (ref 26.0–34.0)
MCHC: 32.1 g/dL (ref 30.0–36.0)
MCV: 82.3 fL (ref 78.0–100.0)
PLATELETS: 258 10*3/uL (ref 150–400)
RBC: 5.42 MIL/uL — AB (ref 3.87–5.11)
RDW: 16.9 % — ABNORMAL HIGH (ref 11.5–15.5)
WBC: 7.8 10*3/uL (ref 4.0–10.5)

## 2016-11-14 LAB — BASIC METABOLIC PANEL
Anion gap: 10 (ref 5–15)
BUN: 7 mg/dL (ref 6–20)
CALCIUM: 9.3 mg/dL (ref 8.9–10.3)
CHLORIDE: 100 mmol/L — AB (ref 101–111)
CO2: 29 mmol/L (ref 22–32)
CREATININE: 0.9 mg/dL (ref 0.44–1.00)
GFR calc non Af Amer: >60 mL/min (ref >60)
Glucose, Bld: 113 mg/dL — ABNORMAL HIGH (ref 65–99)
Potassium: 3.8 mmol/L (ref 3.5–5.1)
SODIUM: 139 mmol/L (ref 135–145)

## 2016-11-14 NOTE — ED Notes (Signed)
Pt had drawn for labs:  Blue Gold Lavender Lt green Dark green x2 

## 2016-11-14 NOTE — ED Triage Notes (Signed)
[facility]- Pt with weakness and dizziness upon standing. She fell but denies injury or LOC. Ekg NSR and vitals stable. Pt a/o at triage.

## 2016-11-15 ENCOUNTER — Emergency Department (HOSPITAL_COMMUNITY): Payer: Medicaid Other

## 2016-11-15 LAB — URINALYSIS, ROUTINE W REFLEX MICROSCOPIC
Bilirubin Urine: NEGATIVE
GLUCOSE, UA: NEGATIVE mg/dL
Ketones, ur: NEGATIVE mg/dL
Leukocytes, UA: NEGATIVE
NITRITE: NEGATIVE
Protein, ur: 30 mg/dL — AB
Specific Gravity, Urine: 1.02 (ref 1.005–1.030)
pH: 5 (ref 5.0–8.0)

## 2016-11-15 LAB — LITHIUM LEVEL: LITHIUM LVL: 0.34 mmol/L — AB (ref 0.60–1.20)

## 2016-11-15 MED ORDER — SODIUM CHLORIDE 0.9 % IV BOLUS (SEPSIS)
1000.0000 mL | Freq: Once | INTRAVENOUS | Status: AC
  Administered 2016-11-15: 1000 mL via INTRAVENOUS

## 2016-11-15 MED ORDER — MECLIZINE HCL 12.5 MG PO TABS: 12.5000 mg | ORAL_TABLET | Freq: Two times a day (BID) | ORAL | 0 refills | Status: DC | PRN

## 2016-11-15 MED ORDER — MECLIZINE HCL 25 MG PO TABS
25.0000 mg | ORAL_TABLET | Freq: Once | ORAL | Status: AC
  Administered 2016-11-15: 25 mg via ORAL
  Filled 2016-11-15: qty 1

## 2016-11-15 MED ORDER — HYDROCODONE-ACETAMINOPHEN 5-325 MG PO TABS
1.0000 | ORAL_TABLET | Freq: Once | ORAL | Status: AC
  Administered 2016-11-15: 1 via ORAL
  Filled 2016-11-15: qty 1

## 2016-11-15 NOTE — ED Notes (Signed)
Writer attempted blood draw unsuccessful

## 2016-11-15 NOTE — Discharge Instructions (Signed)

## 2016-11-15 NOTE — ED Notes (Signed)
[facility] called for transportation  

## 2016-11-15 NOTE — ED Provider Notes (Signed)
[facility]-EMERGENCY DEPT Provider Note   CSN: [id number] Arrival date & time: 11/14/16  1652     History   Chief Complaint Chief Complaint  Patient presents with  . Weakness  . Fall    HPI Kathleen Herrera is a 56 y.o. female.  The history is provided by the patient.  Weakness  Primary symptoms include loss of balance. This is a recurrent problem. The current episode started more than 1 week ago. The problem has been gradually worsening. There was no focality noted. There has been no fever. Pertinent negatives include no shortness of breath, no chest pain, no vomiting and no headaches.  Fall  Pertinent negatives include no chest pain, no headaches and no shortness of breath.  patient presents for ongoing episodes of dizziness and falls She reports while walking she can feel "head spinning" and she falls No LOC No CP/HA She may have injured right thigh earlier during a fall She was recently admitted for similar episodes and had extensive workup. She has followed medication adjustments that were given No drugs or ETOH use  Past Medical History:  Diagnosis Date  . Arthritis   . Asthma   . 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 Bipolar disorder (HCC)   . Bronchitis   . Depression   . Fibromyalgia Y-2  . Fibromyalgia unknown  . Hyperlipemia   . Hypertension   . Post traumatic stress disorder   . Sinus congestion     Patient Active Problem List   Diagnosis Date Noted  . 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 Episodic recurrent vertigo   . Dermatitis   . Asthma   . CAP (community acquired pneumonia) 09/19/2015  . Dyspnea 09/19/2015  . Acute respiratory failure with hypoxia (HCC) 09/19/2015  . Abnormality of gait 06/29/2014  . Essential hypertension 06/29/2014  . Hyperlipidemia 06/29/2014  . Metabolic syndrome 06/29/2014  . Neuropathic pain 06/29/2014  . Gastroesophageal reflux disease without esophagitis 06/29/2014  . Tachycardia 06/29/2014  . At risk for osteopenia 06/29/2014  . Excessive  daytime sleepiness 06/29/2014  . Fibromyalgia   . Abnormal LFTs 03/06/2013  . Extreme obesity 12/18/2012  . Fatty infiltration of liver 07/15/2012  . Gonalgia 07/15/2012  . LBP (low back pain) 07/15/2012  . Arthritis, degenerative 06/12/2012  . Arthritis 05/26/2012  . Bipolar 2 disorder (HCC) 04/17/2012  . Clinical depression 04/17/2012    Past Surgical History:  Procedure Laterality Date  . ABDOMINAL HYSTERECTOMY      OB History    No data available       Home Medications    Prior to Admission medications   Medication Sig Start Date End Date Taking? Authorizing Provider  albuterol (PROVENTIL HFA;VENTOLIN HFA) 108 (90 Base) MCG/ACT inhaler Inhale 2 puffs into the lungs every 6 (six) hours as needed for wheezing or shortness of breath. 09/23/15   Dorothea Ogle, MD  amLODipine (NORVASC) 10 MG tablet Take 1 tablet (10 mg total) by mouth daily. 10/16/16   Bing Neighbors, FNP  aspirin 325 MG tablet Take 1 tablet (325 mg total) by mouth daily. 11/04/16   Penny Pia, MD  betamethasone dipropionate (DIPROLENE) 0.05 % ointment Apply topically 2 (two) times daily. 10/16/16   Bing Neighbors, FNP  budesonide-formoterol (SYMBICORT) 80-4.5 MCG/ACT inhaler Inhale 2 puffs into the lungs 2 (two) times daily. 08/16/16   Bing Neighbors, FNP  clonazePAM (KLONOPIN) 0.5 MG tablet Take 1 mg by mouth 3 (three) times daily as needed for anxiety.     [provider]  clotrimazole-betamethasone (LOTRISONE) cream Apply 1 application topically 2 (two) times daily. Groin area. 08/16/16   Bing Neighbors, FNP  cyclobenzaprine (FLEXERIL) 5 MG tablet Take 1 tablet (5 mg total) by mouth 3 (three) times daily as needed for muscle spasms. 11/03/16   Penny Pia, MD  furosemide (LASIX) 20 MG tablet Take 1 tablet (20 mg total) by mouth daily. 08/27/16   Bing Neighbors, FNP  gabapentin (NEURONTIN) 300 MG capsule Take 1 capsule (300 mg total) by mouth 3 (three) times daily. 10/16/16   Bing Neighbors, FNP  lamoTRIgine (LAMICTAL) 25 MG tablet Take 25 mg by mouth daily.     [provider]  lithium carbonate 300 MG capsule Take 300 mg by mouth 2 (two) times daily with a meal.     [provider]  metoprolol succinate (TOPROL-XL) 50 MG 24 hr tablet Take 2 tablets (100 mg total) by mouth daily. Take with or immediately following a meal. 10/16/16   Bing Neighbors, FNP  nystatin (MYCOSTATIN/NYSTOP) powder APPLY TWO SPRINKLES OF POWDER TO THE AFFECTED INNER THIGHS TO PREVENT ITCHING AND FUNGAL RASH 11/12/16   Bing Neighbors, FNP  oxybutynin (DITROPAN XL) 5 MG 24 hr tablet Take 1 tablet (5 mg total) by mouth at bedtime. 10/16/16   Bing Neighbors, FNP  pantoprazole (PROTONIX) 40 MG tablet Take 1 tablet (40 mg total) by mouth 2 (two) times daily before a meal. 10/16/16   Bing Neighbors, FNP  PARoxetine (PAXIL) 20 MG tablet TAKE 1 TABLET (20 MG TOTAL) BY MOUTH 1 DAY OR 1 DOSE. Patient taking differently: TAKE 1 TABLET (20 MG TOTAL) BY MOUTH DAILY 04/09/16   Henrietta Hoover, NP  predniSONE (DELTASONE) 20 MG tablet Take 1 tablet (20 mg total) by mouth daily with breakfast. 10/16/16   Bing Neighbors, FNP  simvastatin (ZOCOR) 20 MG tablet TAKE ONE TABLET   (20 MG TOTAL ) BY MOUTH   AT BEDTIME 03/12/16   Henrietta Hoover, NP  solifenacin (VESICARE) 5 MG tablet Take 1 tablet (5 mg total) by mouth daily. 09/07/16 09/07/17  Bing Neighbors, FNP    Family History Family History  Problem Relation Age of Onset  . Dementia Mother   . Prostate cancer Father   . Dementia Father   . Prostate cancer Other   . CAD Other     Social History Social History  Substance Use Topics  . Smoking status: Current Every Day Smoker    Packs/day: 0.25    Years: 37.00    Types: Cigarettes  . Smokeless tobacco: Never Used  . Alcohol use Yes     Comment: occasionally     Allergies   Patient has no known allergies.   Review of Systems Review of Systems  Constitutional:  Negative for fever.  HENT: Negative for hearing loss.   Eyes: Negative for visual disturbance.  Respiratory: Negative for shortness of breath.   Cardiovascular: Negative for chest pain.  Gastrointestinal: Negative for vomiting.  Musculoskeletal: Positive for myalgias. Negative for back pain and neck pain.  Neurological: Positive for weakness and loss of balance. Negative for syncope and headaches.  All other systems reviewed and are negative.    Physical Exam Updated Vital Signs BP 114/69 (BP Location: Left Arm)   Pulse (!) 58   Temp 98.9 F (37.2 C) (Oral)   Resp 19   SpO2 100%   Physical Exam CONSTITUTIONAL: Well developed/well nourished HEAD: Normocephalic/atraumatic EYES: EOMI/PERRL, no nystagmus,no ptosis ENMT:  Mucous membranes moist NECK: supple no meningeal signs, no bruits CV: S1/S2 noted, no murmurs/rubs/gallops noted LUNGS: Lungs are clear to auscultation bilaterally, no apparent distress ABDOMEN: soft, nontender, no rebound or guarding GU:no cva tenderness NEURO:Awake/alert, face symmetric, no arm or leg drift is noted Equal 5/5 strength with hip flexion,knee flex/extension, foot dorsi/plantar flexion Cranial nerves 3/4/5/6/08/13/08/11/12 tested and intact No past pointing Sensation to light touch intact in all extremities EXTREMITIES: pulses normal, full ROM, tenderness to palpation of right thigh, no deformities SKIN: warm, color normal PSYCH: no abnormalities of mood noted   ED Treatments / Results  Labs (all labs ordered are listed, but only abnormal results are displayed) Labs Reviewed  BASIC METABOLIC PANEL - Abnormal; Notable for the following:       Result Value   Chloride 100 (*)    Glucose, Bld 113 (*)    All other components within normal limits  CBC - Abnormal; Notable for the following:    RBC 5.42 (*)    RDW 16.9 (*)    All other components within normal limits  URINALYSIS, ROUTINE W REFLEX MICROSCOPIC - Abnormal; Notable for the following:     APPearance HAZY (*)    Hgb urine dipstick SMALL (*)    Protein, ur 30 (*)    Bacteria, UA RARE (*)    Squamous Epithelial / LPF 0-5 (*)    All other components within normal limits  LITHIUM LEVEL - Abnormal; Notable for the following:    Lithium Lvl 0.34 (*)    All other components within normal limits    EKG  EKG Interpretation  Date/Time:  Thursday November 15 2016 01:55:10 EDT Ventricular Rate:  63 PR Interval:    QRS Duration: 95 QT Interval:  460 QTC Calculation: 471 R Axis:   34 Text Interpretation:  Sinus rhythm Low voltage, extremity and precordial leads No significant change since last tracing Confirmed by Zadie Rhine ([id number]) on 11/15/2016 2:00:57 AM       Radiology Dg Femur Min 2 Views Right  Result Date: 11/15/2016 CLINICAL DATA:  Status post fall, with right upper thigh pain. Initial encounter. EXAM: RIGHT FEMUR 2 VIEWS COMPARISON:  None. FINDINGS: There is no evidence of fracture or dislocation. The right femur appears intact. The right femoral head remains seated at the acetabulum. No significant soft tissue abnormalities are characterized on radiograph. A rectangular density overlying the right upper thigh is thought to be outside the patient. No knee joint effusion is seen. IMPRESSION: No evidence of fracture or dislocation. Electronically Signed   By: Roanna Raider M.D.   On: 11/15/2016 00:20    Procedures Procedures   Medications Ordered in ED Medications  meclizine (ANTIVERT) tablet 25 mg (not administered)  sodium chloride 0.9 % bolus 1,000 mL (0 mLs Intravenous Stopped 11/15/16 0147)  HYDROcodone-acetaminophen (NORCO/VICODIN) 5-325 MG per tablet 1 tablet (1 tablet Oral Given 11/15/16 0030)  sodium chloride 0.9 % bolus 1,000 mL (1,000 mLs Intravenous New Bag/Given 11/15/16 0213)     Initial Impression / Assessment and Plan / ED Course  I have reviewed the triage vital signs and the nursing notes.  Pertinent labs & imaging results that were  available during my care of the patient were reviewed by me and considered in my medical decision making (see chart for details).     12:34 AM Recent admission - echo normal EF normal.   Mri negative CT angio head/neck negative It was felt symptoms due to polypharmacy 4:43 AM Pt improved Exhaustive  workup tonight and on previous admission negative for stroke or other acute cause Likely peripheral vertigo She saw PT in hospital last time and deemed safe for discharge with rolling walker Pt reports she has this at home and it helps prevents falls Encouraged to use as much as possible She requests meds - will start antivert Given info for neuro followup  Final Clinical Impressions(s) / ED Diagnoses   Final diagnoses:  Vertigo    New Prescriptions New Prescriptions   No medications on file     Zadie Rhine, MD 11/15/16 [phone]

## 2016-11-15 NOTE — ED Notes (Signed)
Pt. Ambulated from her room to the door. Pt. States she was dizzy, pt. Redirected back to her bed. Pt. Gait steady on her feet. EDP,Wickline,MD., made aware.

## 2016-11-15 NOTE — ED Notes (Signed)
RN will start an IV line and draw blood work 

## 2016-11-15 NOTE — ED Notes (Signed)
Writer attempted to redraw blood work, unsuccessful X 2

## 2016-11-20 ENCOUNTER — Encounter: Payer: Self-pay | Admitting: Family Medicine

## 2016-11-20 ENCOUNTER — Ambulatory Visit (INDEPENDENT_AMBULATORY_CARE_PROVIDER_SITE_OTHER): Payer: Medicaid Other | Admitting: Family Medicine

## 2016-11-20 VITALS — BP 118/74 | HR 70 | Temp 98.3°F | Resp 12 | Ht 63.0 in | Wt 240.0 lb

## 2016-11-20 DIAGNOSIS — K098 Other cysts of oral region, not elsewhere classified: Secondary | ICD-10-CM | POA: Diagnosis not present

## 2016-11-20 DIAGNOSIS — E7800 Pure hypercholesterolemia, unspecified: Secondary | ICD-10-CM

## 2016-11-20 DIAGNOSIS — I10 Essential (primary) hypertension: Secondary | ICD-10-CM

## 2016-11-20 DIAGNOSIS — R42 Dizziness and giddiness: Secondary | ICD-10-CM | POA: Diagnosis not present

## 2016-11-20 LAB — POCT URINALYSIS DIP (DEVICE)
BILIRUBIN URINE: NEGATIVE
Glucose, UA: NEGATIVE mg/dL
KETONES UR: NEGATIVE mg/dL
Leukocytes, UA: NEGATIVE
Nitrite: NEGATIVE
PH: 5.5 (ref 5.0–8.0)
PROTEIN: 30 mg/dL — AB
Specific Gravity, Urine: 1.02 (ref 1.005–1.030)
Urobilinogen, UA: 1 mg/dL (ref 0.0–1.0)

## 2016-11-20 MED ORDER — DIMENHYDRINATE 50 MG PO TABS: 50.0000 mg | ORAL_TABLET | Freq: Three times a day (TID) | ORAL | 0 refills | Status: DC | PRN

## 2016-11-20 MED ORDER — BUDESONIDE-FORMOTEROL FUMARATE 80-4.5 MCG/ACT IN AERO: 2.0000 | INHALATION_SPRAY | Freq: Two times a day (BID) | RESPIRATORY_TRACT | 3 refills | Status: DC

## 2016-11-20 MED ORDER — CYCLOBENZAPRINE HCL 5 MG PO TABS
5.0000 mg | ORAL_TABLET | Freq: Three times a day (TID) | ORAL | 0 refills | Status: DC | PRN
Start: 2016-11-20 — End: 2017-03-01

## 2016-11-20 MED ORDER — FLUCONAZOLE 150 MG PO TABS: 150.0000 mg | ORAL_TABLET | Freq: Once | ORAL | 0 refills | Status: AC

## 2016-11-20 MED ORDER — ASPIRIN 325 MG PO TABS: 325.0000 mg | ORAL_TABLET | Freq: Every day | ORAL | 6 refills | Status: DC

## 2016-11-20 MED ORDER — AMOXICILLIN 875 MG PO TABS: 875.0000 mg | ORAL_TABLET | Freq: Two times a day (BID) | ORAL | 0 refills | Status: DC

## 2016-11-20 MED ORDER — SOLIFENACIN SUCCINATE 5 MG PO TABS: 5.0000 mg | ORAL_TABLET | Freq: Every day | ORAL | 3 refills | Status: DC

## 2016-11-20 MED ORDER — SIMVASTATIN 20 MG PO TABS: ORAL_TABLET | ORAL | 1 refills | Status: DC

## 2016-11-20 MED ORDER — FUROSEMIDE 20 MG PO TABS: 20.0000 mg | ORAL_TABLET | Freq: Every day | ORAL | 3 refills | Status: DC

## 2016-11-20 NOTE — Patient Instructions (Addendum)
All requested medication request have been refilled.  For mouth cysts, start Amoxicillin 875 mg twice daily x 10 day.

## 2016-11-20 NOTE — Progress Notes (Signed)
Patient ID: Kathleen Herrera, female    DOB: [birth year], 56 y.o.   MRN: [id number]  PCP: Bing Neighbors, FNP  Chief Complaint  Patient presents with  . Dizziness    Subjective:  HPI Kathleen Herrera is a 56 y.o. female presents for evaluation of persisting vertigo. Kathleen Herrera was evaluated and treated at [facility] ED for dizziness which was later determined to be vertigo. She was started on Meclizine and reports since taking medication she feels the symptoms of room spinning have worsened. Symptoms are exacerbated by walking and positional changes. Denies any generalized weakness, nausea, vomiting, blurring of vision, or headache. Kathleen Herrera also complains of a left inner cheek ulcer that erupted x 1 day ago draining pus-like exudate with blood. She denies fever or chills. She rinsed her mouth out once ulcer erupted although reports that throat has felt irritated since this occurred.  Social History   Social History  . Marital status: Single    Spouse name: N/A  . Number of children: N/A  . Years of education: N/A   Occupational History  . Not on file.   Social History Main Topics  . Smoking status: Current Every Day Smoker    Packs/day: 0.25    Years: 37.00    Types: Cigarettes  . Smokeless tobacco: Never Used  . Alcohol use Yes     Comment: occasionally  . Drug use: No     Comment: crack  . Sexual activity: Not Currently   Other Topics Concern  . Not on file   Social History Narrative  . No narrative on file    Family History  Problem Relation Age of Onset  . Dementia Mother   . Prostate cancer Father   . Dementia Father   . Prostate cancer Other   . CAD Other    Review of Systems See HPI  Patient Active Problem List   Diagnosis Date Noted  . 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 Episodic recurrent vertigo   . Dermatitis   . Asthma   . CAP (community acquired pneumonia) 09/19/2015  . Dyspnea 09/19/2015  . Acute respiratory failure with hypoxia (HCC) 09/19/2015  .  Abnormality of gait 06/29/2014  . Essential hypertension 06/29/2014  . Hyperlipidemia 06/29/2014  . Metabolic syndrome 06/29/2014  . Neuropathic pain 06/29/2014  . Gastroesophageal reflux disease without esophagitis 06/29/2014  . Tachycardia 06/29/2014  . At risk for osteopenia 06/29/2014  . Excessive daytime sleepiness 06/29/2014  . Fibromyalgia   . Abnormal LFTs 03/06/2013  . Extreme obesity 12/18/2012  . Fatty infiltration of liver 07/15/2012  . Gonalgia 07/15/2012  . LBP (low back pain) 07/15/2012  . Arthritis, degenerative 06/12/2012  . Arthritis 05/26/2012  . Bipolar 2 disorder (HCC) 04/17/2012  . Clinical depression 04/17/2012    No Known Allergies  Prior to Admission medications   Medication Sig Start Date End Date Taking? Authorizing Provider  albuterol (PROVENTIL HFA;VENTOLIN HFA) 108 (90 Base) MCG/ACT inhaler Inhale 2 puffs into the lungs every 6 (six) hours as needed for wheezing or shortness of breath. 09/23/15  Yes Dorothea Ogle, MD  amLODipine (NORVASC) 10 MG tablet Take 1 tablet (10 mg total) by mouth daily. 10/16/16  Yes Bing Neighbors, FNP  aspirin 325 MG tablet Take 1 tablet (325 mg total) by mouth daily. 11/04/16  Yes Penny Pia, MD  betamethasone dipropionate (DIPROLENE) 0.05 % ointment Apply topically 2 (two) times daily. 10/16/16  Yes Bing Neighbors, FNP  budesonide-formoterol (SYMBICORT) 80-4.5 MCG/ACT inhaler Inhale 2  puffs into the lungs 2 (two) times daily. 08/16/16  Yes Bing Neighbors, FNP  clotrimazole-betamethasone (LOTRISONE) cream Apply 1 application topically 2 (two) times daily. Groin area. 08/16/16  Yes Bing Neighbors, FNP  cyclobenzaprine (FLEXERIL) 5 MG tablet Take 1 tablet (5 mg total) by mouth 3 (three) times daily as needed for muscle spasms. 11/03/16  Yes Penny Pia, MD  furosemide (LASIX) 20 MG tablet Take 1 tablet (20 mg total) by mouth daily. 08/27/16  Yes Bing Neighbors, FNP  gabapentin (NEURONTIN) 300 MG capsule Take 1  capsule (300 mg total) by mouth 3 (three) times daily. 10/16/16  Yes Bing Neighbors, FNP  hydrOXYzine (ATARAX/VISTARIL) 25 MG tablet Take 25 mg by mouth 3 (three) times daily.   Yes [provider]  lamoTRIgine (LAMICTAL) 25 MG tablet Take 25 mg by mouth daily.    Yes [provider]  lithium carbonate 300 MG capsule Take 300 mg by mouth 2 (two) times daily with a meal.    Yes [provider]  metoprolol succinate (TOPROL-XL) 50 MG 24 hr tablet Take 2 tablets (100 mg total) by mouth daily. Take with or immediately following a meal. 10/16/16  Yes Bing Neighbors, FNP  nystatin (MYCOSTATIN/NYSTOP) powder APPLY TWO SPRINKLES OF POWDER TO THE AFFECTED INNER THIGHS TO PREVENT ITCHING AND FUNGAL RASH 11/12/16  Yes Bing Neighbors, FNP  oxybutynin (DITROPAN XL) 5 MG 24 hr tablet Take 1 tablet (5 mg total) by mouth at bedtime. 10/16/16  Yes Bing Neighbors, FNP  pantoprazole (PROTONIX) 40 MG tablet Take 1 tablet (40 mg total) by mouth 2 (two) times daily before a meal. 10/16/16  Yes Bing Neighbors, FNP  PARoxetine (PAXIL) 20 MG tablet TAKE 1 TABLET (20 MG TOTAL) BY MOUTH 1 DAY OR 1 DOSE. Patient taking differently: TAKE 1 TABLET (20 MG TOTAL) BY MOUTH DAILY 04/09/16  Yes Henrietta Hoover, NP  simvastatin (ZOCOR) 20 MG tablet TAKE ONE TABLET   (20 MG TOTAL ) BY MOUTH   AT BEDTIME 03/12/16  Yes Henrietta Hoover, NP  solifenacin (VESICARE) 5 MG tablet Take 1 tablet (5 mg total) by mouth daily. 09/07/16 09/07/17 Yes Bing Neighbors, FNP  vitamin C (ASCORBIC ACID) 500 MG tablet Take 500 mg by mouth 4 (four) times daily.   Yes [provider]  meclizine (ANTIVERT) 12.5 MG tablet Take 1 tablet (12.5 mg total) by mouth 2 (two) times daily as needed for dizziness. Patient not taking: Reported on 11/20/2016 11/15/16   Zadie Rhine, MD    Past Medical, Surgical Family and Social History reviewed and updated.    Objective:   Today's Vitals   11/20/16 1340  BP:  118/74  Pulse: 70  Resp: 12  Temp: 98.3 F (36.8 C)  TempSrc: Oral  SpO2: 96%  Weight: 240 lb (108.9 kg)  Height:  (1.6 m)    Wt Readings from Last 3 Encounters:  11/20/16 240 lb (108.9 kg)  11/02/16 249 lb (112.9 kg)  10/16/16 244 lb (110.7 kg)   Physical Exam  Constitutional: She is oriented to person, place, and time. She appears well-developed and well-nourished.  HENT:  Head: Normocephalic and atraumatic.  Mouth/Throat: Oral lesions present.  Whitish/erythemic skin lesion inner left buccal mucosa.   Eyes: Pupils are equal, round, and reactive to light. Conjunctivae and EOM are normal.  Neck: Normal range of motion. Neck supple.  Cardiovascular: Normal rate, regular rhythm, normal heart sounds and intact distal pulses.  Pulmonary/Chest: Effort normal and breath sounds normal.  Abdominal: Soft. Bowel sounds are normal.  Musculoskeletal: Normal range of motion.  Lymphadenopathy:    She has no cervical adenopathy.  Neurological: She is alert and oriented to person, place, and time. Coordination normal.  Psychiatric: She has a normal mood and affect. Her behavior is normal. Judgment and thought content normal.   Assessment & Plan:  1. Vertigo, discontinue meclizine and trial Dramamine. Referring to physical therapy for vestibular rehabilitation. 2. Essential hypertension, medication refill only. - furosemide (LASIX) 20 MG tablet; Take 1 tablet (20 mg total) by mouth daily.  3. Pure hypercholesterolemia, medication refill only.  simvastatin (ZOCOR) 20 MG tablet 4. Buccal cyst-trial with Amoxicillin 875 mg twice daily x 10 days.  Return for care if symptoms worsen or do not improve. Keep upcoming follow-up.  Godfrey Pick. Tiburcio Pea, MSN, FNP-C [facility]  [address] [place], [place] [address] [phone]

## 2016-12-05 ENCOUNTER — Other Ambulatory Visit: Payer: Self-pay | Admitting: Family Medicine

## 2016-12-05 ENCOUNTER — Other Ambulatory Visit: Payer: Self-pay

## 2016-12-05 MED ORDER — MECLIZINE HCL 12.5 MG PO TABS: 12.5000 mg | ORAL_TABLET | Freq: Two times a day (BID) | ORAL | 1 refills | Status: DC | PRN

## 2016-12-05 MED ORDER — BETAMETHASONE DIPROPIONATE 0.05 % EX OINT
TOPICAL_OINTMENT | Freq: Two times a day (BID) | CUTANEOUS | 1 refills | Status: DC
Start: 2016-12-05 — End: 2017-04-23

## 2017-01-07 ENCOUNTER — Other Ambulatory Visit: Payer: Self-pay | Admitting: Family Medicine

## 2017-01-10 ENCOUNTER — Telehealth: Payer: Self-pay

## 2017-01-10 MED ORDER — MECLIZINE HCL 12.5 MG PO TABS: ORAL_TABLET | ORAL | 1 refills | Status: DC

## 2017-01-10 NOTE — Telephone Encounter (Signed)
Medication was resent to pharmacy. 

## 2017-01-24 ENCOUNTER — Other Ambulatory Visit: Payer: Self-pay | Admitting: Family Medicine

## 2017-02-18 ENCOUNTER — Ambulatory Visit: Payer: Medicaid Other | Admitting: Family Medicine

## 2017-02-24 ENCOUNTER — Encounter (HOSPITAL_COMMUNITY): Payer: Self-pay | Admitting: Emergency Medicine

## 2017-02-24 ENCOUNTER — Emergency Department (HOSPITAL_COMMUNITY)
Admission: EM | Admit: 2017-02-24 | Discharge: 2017-02-25 | Disposition: A | Discharge status: Other Acute Inpatient Hospital | Payer: Medicaid Other | Attending: Emergency Medicine | Admitting: Emergency Medicine

## 2017-02-24 ENCOUNTER — Emergency Department (HOSPITAL_COMMUNITY): Payer: Medicaid Other

## 2017-02-24 DIAGNOSIS — Z008 Encounter for other general examination: Secondary | ICD-10-CM | POA: Insufficient documentation

## 2017-02-24 DIAGNOSIS — F329 Major depressive disorder, single episode, unspecified: Secondary | ICD-10-CM | POA: Insufficient documentation

## 2017-02-24 DIAGNOSIS — R072 Precordial pain: Secondary | ICD-10-CM

## 2017-02-24 DIAGNOSIS — F1910 Other psychoactive substance abuse, uncomplicated: Secondary | ICD-10-CM | POA: Insufficient documentation

## 2017-02-24 DIAGNOSIS — F17210 Nicotine dependence, cigarettes, uncomplicated: Secondary | ICD-10-CM | POA: Insufficient documentation

## 2017-02-24 DIAGNOSIS — F319 Bipolar disorder, unspecified: Secondary | ICD-10-CM | POA: Insufficient documentation

## 2017-02-24 DIAGNOSIS — F32A Depression, unspecified: Secondary | ICD-10-CM

## 2017-02-24 DIAGNOSIS — Z79899 Other long term (current) drug therapy: Secondary | ICD-10-CM | POA: Insufficient documentation

## 2017-02-24 DIAGNOSIS — R0602 Shortness of breath: Secondary | ICD-10-CM | POA: Insufficient documentation

## 2017-02-24 DIAGNOSIS — Z7982 Long term (current) use of aspirin: Secondary | ICD-10-CM | POA: Insufficient documentation

## 2017-02-24 DIAGNOSIS — R45851 Suicidal ideations: Secondary | ICD-10-CM | POA: Insufficient documentation

## 2017-02-24 DIAGNOSIS — J45909 Unspecified asthma, uncomplicated: Secondary | ICD-10-CM | POA: Insufficient documentation

## 2017-02-24 LAB — RAPID URINE DRUG SCREEN, HOSP PERFORMED
Amphetamines: NOT DETECTED
BENZODIAZEPINES: NOT DETECTED
Barbiturates: NOT DETECTED
COCAINE: POSITIVE — AB
OPIATES: POSITIVE — AB
Tetrahydrocannabinol: NOT DETECTED

## 2017-02-24 LAB — CBC
HCT: 41.1 % (ref 36.0–46.0)
Hemoglobin: 13.4 g/dL (ref 12.0–15.0)
MCH: 26.4 pg (ref 26.0–34.0)
MCHC: 32.6 g/dL (ref 30.0–36.0)
MCV: 80.9 fL (ref 78.0–100.0)
PLATELETS: 332 10*3/uL (ref 150–400)
RBC: 5.08 MIL/uL (ref 3.87–5.11)
RDW: 15.1 % (ref 11.5–15.5)
WBC: 11 10*3/uL — AB (ref 4.0–10.5)

## 2017-02-24 LAB — COMPREHENSIVE METABOLIC PANEL
ALT: 17 U/L (ref 14–54)
ANION GAP: 12 (ref 5–15)
AST: 27 U/L (ref 15–41)
Albumin: 4 g/dL (ref 3.5–5.0)
Alkaline Phosphatase: 76 U/L (ref 38–126)
BILIRUBIN TOTAL: 0.7 mg/dL (ref 0.3–1.2)
BUN: 9 mg/dL (ref 6–20)
CHLORIDE: 102 mmol/L (ref 101–111)
CO2: 22 mmol/L (ref 22–32)
Calcium: 9.4 mg/dL (ref 8.9–10.3)
Creatinine, Ser: 1.09 mg/dL — ABNORMAL HIGH (ref 0.44–1.00)
GFR, EST NON AFRICAN AMERICAN: 55 mL/min — AB (ref >60)
Glucose, Bld: 122 mg/dL — ABNORMAL HIGH (ref 65–99)
Potassium: 3.8 mmol/L (ref 3.5–5.1)
Sodium: 136 mmol/L (ref 135–145)
Total Protein: 7.4 g/dL (ref 6.5–8.1)

## 2017-02-24 LAB — I-STAT TROPONIN, ED
TROPONIN I, POC: 0 ng/mL (ref 0.00–0.08)
TROPONIN I, POC: 0 ng/mL (ref 0.00–0.08)

## 2017-02-24 LAB — I-STAT BETA HCG BLOOD, ED (MC, WL, AP ONLY)

## 2017-02-24 LAB — ETHANOL

## 2017-02-24 MED ORDER — MECLIZINE HCL 25 MG PO TABS
25.0000 mg | ORAL_TABLET | Freq: Two times a day (BID) | ORAL | Status: DC
  Administered 2017-02-24 – 2017-02-25 (×2): 25 mg via ORAL
  Filled 2017-02-24 (×2): qty 1

## 2017-02-24 MED ORDER — ASPIRIN 325 MG PO TABS
325.0000 mg | ORAL_TABLET | Freq: Every day | ORAL | Status: DC
  Administered 2017-02-25: 325 mg via ORAL
  Filled 2017-02-24: qty 1

## 2017-02-24 MED ORDER — AMLODIPINE BESYLATE 5 MG PO TABS
10.0000 mg | ORAL_TABLET | Freq: Every day | ORAL | Status: DC
  Administered 2017-02-25: 10 mg via ORAL
  Filled 2017-02-24: qty 2

## 2017-02-24 MED ORDER — LAMOTRIGINE 25 MG PO TABS
25.0000 mg | ORAL_TABLET | Freq: Two times a day (BID) | ORAL | Status: DC
  Administered 2017-02-24 – 2017-02-25 (×2): 25 mg via ORAL
  Filled 2017-02-24 (×2): qty 1

## 2017-02-24 MED ORDER — HYDROMORPHONE HCL 1 MG/ML IJ SOLN
1.0000 mg | Freq: Once | INTRAMUSCULAR | Status: AC
  Administered 2017-02-24: 1 mg via INTRAVENOUS
  Filled 2017-02-24: qty 1

## 2017-02-24 MED ORDER — SIMVASTATIN 20 MG PO TABS
20.0000 mg | ORAL_TABLET | Freq: Every day | ORAL | Status: DC
  Filled 2017-02-24: qty 1

## 2017-02-24 MED ORDER — FUROSEMIDE 20 MG PO TABS
20.0000 mg | ORAL_TABLET | Freq: Every day | ORAL | Status: DC
  Administered 2017-02-25: 20 mg via ORAL
  Filled 2017-02-24: qty 1

## 2017-02-24 MED ORDER — SODIUM CHLORIDE 0.9 % IV BOLUS (SEPSIS)
1000.0000 mL | Freq: Once | INTRAVENOUS | Status: AC
  Administered 2017-02-24: 1000 mL via INTRAVENOUS

## 2017-02-24 MED ORDER — LITHIUM CARBONATE 300 MG PO CAPS
300.0000 mg | ORAL_CAPSULE | Freq: Two times a day (BID) | ORAL | Status: DC
  Administered 2017-02-25: 300 mg via ORAL
  Filled 2017-02-24 (×2): qty 1

## 2017-02-24 MED ORDER — GABAPENTIN 300 MG PO CAPS
300.0000 mg | ORAL_CAPSULE | Freq: Three times a day (TID) | ORAL | Status: DC
  Administered 2017-02-24 – 2017-02-25 (×2): 300 mg via ORAL
  Filled 2017-02-24 (×2): qty 1

## 2017-02-24 MED ORDER — ACETAMINOPHEN 325 MG PO TABS
650.0000 mg | ORAL_TABLET | ORAL | Status: DC | PRN
  Administered 2017-02-25: 650 mg via ORAL
  Filled 2017-02-24: qty 2

## 2017-02-24 MED ORDER — ALBUTEROL SULFATE HFA 108 (90 BASE) MCG/ACT IN AERS: 2.0000 | INHALATION_SPRAY | Freq: Four times a day (QID) | RESPIRATORY_TRACT | Status: DC | PRN

## 2017-02-24 MED ORDER — MORPHINE SULFATE (PF) 4 MG/ML IV SOLN
4.0000 mg | Freq: Once | INTRAVENOUS | Status: AC
  Administered 2017-02-24: 4 mg via INTRAVENOUS
  Filled 2017-02-24: qty 1
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
## 2017-02-24 NOTE — ED Notes (Signed)
TTS in process 
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
## 2017-02-24 NOTE — ED Notes (Signed)
Staffing Office aware of need for Sitter. 
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
## 2017-02-24 NOTE — ED Notes (Signed)
Pt given wine colored scrubs to change into. Security also called to wand pt. Pt has a Comptrollersitter and all pt belongings are at nurses desk. Pt does have her glasses.

## 2017-02-24 NOTE — ED Notes (Signed)
Pt undressed and placed in a gown. Checked temp 99.4. Pt given a sheet to cover up with
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
## 2017-02-25 ENCOUNTER — Inpatient Hospital Stay (HOSPITAL_COMMUNITY)
Admission: AD | Admit: 2017-02-25 | Discharge: 2017-03-01 | DRG: 885 | Disposition: A | Discharge status: Home / Self Care | Payer: Medicaid Other | Source: Intra-hospital | Attending: Psychiatry | Admitting: Psychiatry

## 2017-02-25 ENCOUNTER — Encounter (HOSPITAL_COMMUNITY): Payer: Self-pay | Admitting: *Deleted

## 2017-02-25 ENCOUNTER — Other Ambulatory Visit: Payer: Self-pay

## 2017-02-25 DIAGNOSIS — E785 Hyperlipidemia, unspecified: Secondary | ICD-10-CM | POA: Diagnosis present

## 2017-02-25 DIAGNOSIS — Z7951 Long term (current) use of inhaled steroids: Secondary | ICD-10-CM | POA: Diagnosis not present

## 2017-02-25 DIAGNOSIS — F1490 Cocaine use, unspecified, uncomplicated: Secondary | ICD-10-CM | POA: Diagnosis not present

## 2017-02-25 DIAGNOSIS — Z818 Family history of other mental and behavioral disorders: Secondary | ICD-10-CM

## 2017-02-25 DIAGNOSIS — R450 Nervousness: Secondary | ICD-10-CM | POA: Diagnosis not present

## 2017-02-25 DIAGNOSIS — Z810 Family history of intellectual disabilities: Secondary | ICD-10-CM | POA: Diagnosis not present

## 2017-02-25 DIAGNOSIS — F39 Unspecified mood [affective] disorder: Secondary | ICD-10-CM | POA: Diagnosis not present

## 2017-02-25 DIAGNOSIS — F1994 Other psychoactive substance use, unspecified with psychoactive substance-induced mood disorder: Secondary | ICD-10-CM | POA: Diagnosis not present

## 2017-02-25 DIAGNOSIS — K219 Gastro-esophageal reflux disease without esophagitis: Secondary | ICD-10-CM | POA: Diagnosis present

## 2017-02-25 DIAGNOSIS — F1410 Cocaine abuse, uncomplicated: Secondary | ICD-10-CM | POA: Diagnosis not present

## 2017-02-25 DIAGNOSIS — Z7982 Long term (current) use of aspirin: Secondary | ICD-10-CM

## 2017-02-25 DIAGNOSIS — J45909 Unspecified asthma, uncomplicated: Secondary | ICD-10-CM | POA: Diagnosis present

## 2017-02-25 DIAGNOSIS — G4700 Insomnia, unspecified: Secondary | ICD-10-CM | POA: Diagnosis not present

## 2017-02-25 DIAGNOSIS — F1414 Cocaine abuse with cocaine-induced mood disorder: Secondary | ICD-10-CM | POA: Diagnosis present

## 2017-02-25 DIAGNOSIS — F17210 Nicotine dependence, cigarettes, uncomplicated: Secondary | ICD-10-CM | POA: Diagnosis present

## 2017-02-25 DIAGNOSIS — F419 Anxiety disorder, unspecified: Secondary | ICD-10-CM | POA: Diagnosis not present

## 2017-02-25 DIAGNOSIS — I10 Essential (primary) hypertension: Secondary | ICD-10-CM | POA: Diagnosis present

## 2017-02-25 DIAGNOSIS — F314 Bipolar disorder, current episode depressed, severe, without psychotic features: Secondary | ICD-10-CM | POA: Diagnosis not present

## 2017-02-25 DIAGNOSIS — R42 Dizziness and giddiness: Secondary | ICD-10-CM | POA: Diagnosis present

## 2017-02-25 MED ORDER — MECLIZINE HCL 25 MG PO TABS
25.0000 mg | ORAL_TABLET | Freq: Two times a day (BID) | ORAL | Status: DC
  Administered 2017-02-25 – 2017-02-26 (×2): 25 mg via ORAL
  Filled 2017-02-25 (×5): qty 1

## 2017-02-25 MED ORDER — METOPROLOL SUCCINATE ER 25 MG PO TB24: 50.0000 mg | ORAL_TABLET | Freq: Every day | ORAL | Status: DC

## 2017-02-25 MED ORDER — LITHIUM CARBONATE 300 MG PO CAPS
300.0000 mg | ORAL_CAPSULE | Freq: Two times a day (BID) | ORAL | Status: DC
  Administered 2017-02-25 – 2017-02-28 (×6): 300 mg via ORAL
  Filled 2017-02-25 (×11): qty 1

## 2017-02-25 MED ORDER — ALUM & MAG HYDROXIDE-SIMETH 200-200-20 MG/5ML PO SUSP
30.0000 mL | ORAL | Status: DC | PRN
  Administered 2017-02-26: 30 mL via ORAL
  Filled 2017-02-25: qty 30

## 2017-02-25 MED ORDER — AMLODIPINE BESYLATE 10 MG PO TABS
10.0000 mg | ORAL_TABLET | Freq: Every day | ORAL | Status: DC
  Administered 2017-02-25 – 2017-02-26 (×2): 10 mg via ORAL
  Filled 2017-02-25 (×4): qty 1

## 2017-02-25 MED ORDER — SIMVASTATIN 20 MG PO TABS
20.0000 mg | ORAL_TABLET | Freq: Every day | ORAL | Status: DC
  Administered 2017-02-25 – 2017-02-28 (×4): 20 mg via ORAL
  Filled 2017-02-25 (×7): qty 1

## 2017-02-25 MED ORDER — FUROSEMIDE 20 MG PO TABS
20.0000 mg | ORAL_TABLET | Freq: Every day | ORAL | Status: DC
  Administered 2017-02-25 – 2017-03-01 (×5): 20 mg via ORAL
  Filled 2017-02-25 (×8): qty 1

## 2017-02-25 MED ORDER — MAGNESIUM HYDROXIDE 400 MG/5ML PO SUSP: 30.0000 mL | Freq: Every day | ORAL | Status: DC | PRN

## 2017-02-25 MED ORDER — ACETAMINOPHEN 325 MG PO TABS: 650.0000 mg | ORAL_TABLET | ORAL | Status: DC | PRN

## 2017-02-25 MED ORDER — LAMOTRIGINE 25 MG PO TABS
25.0000 mg | ORAL_TABLET | Freq: Two times a day (BID) | ORAL | Status: DC
  Administered 2017-02-25 – 2017-03-01 (×8): 25 mg via ORAL
  Filled 2017-02-25 (×13): qty 1

## 2017-02-25 MED ORDER — ASPIRIN 325 MG PO TABS
325.0000 mg | ORAL_TABLET | Freq: Every day | ORAL | Status: DC
  Administered 2017-02-25 – 2017-03-01 (×5): 325 mg via ORAL
  Filled 2017-02-25 (×8): qty 1

## 2017-02-25 MED ORDER — PANTOPRAZOLE SODIUM 20 MG PO TBEC
20.0000 mg | DELAYED_RELEASE_TABLET | Freq: Two times a day (BID) | ORAL | Status: DC
  Administered 2017-02-25 – 2017-03-01 (×8): 20 mg via ORAL
  Filled 2017-02-25 (×13): qty 1

## 2017-02-25 MED ORDER — GABAPENTIN 300 MG PO CAPS
300.0000 mg | ORAL_CAPSULE | Freq: Three times a day (TID) | ORAL | Status: DC
  Administered 2017-02-25 – 2017-03-01 (×12): 300 mg via ORAL
  Filled 2017-02-25 (×18): qty 1

## 2017-02-25 MED ORDER — ALBUTEROL SULFATE HFA 108 (90 BASE) MCG/ACT IN AERS: 2.0000 | INHALATION_SPRAY | Freq: Four times a day (QID) | RESPIRATORY_TRACT | Status: DC | PRN

## 2017-02-25 NOTE — Progress Notes (Signed)
Kathleen Herrera is a 57 year old female pt admitted on voluntary basis. On admission, she spoke about what led her to being hospitalized , did endorse depression and wanting help with drug addiction and wanting someone to talk to. She denied any current SI and is able to contract for safety while in the hospital. She reports that she does not use drugs regularly but every now and then she gets the urge to use. She spoke about how she goes to [facility] management and reports that she takes all her medications as prescribed. She reports that she lives with her landlord and reports that she will go back to the same situation upon discharge. She was oriented to the unit and safety maintained.

## 2017-02-25 NOTE — Progress Notes (Signed)
Adult Psychoeducational Group Note  Date:  02/25/2017 Time: 1600 Group Topic/Focus:  Goals Group:   The focus of this group is to help patients establish daily goals to achieve during treatment and discuss how the patient can incorporate goal setting into their daily lives to aide in recovery.  Participation Level:  Active  Participation Quality:  Appropriate  Affect:  Appropriate  Cognitive:  Appropriate  Insight: Appropriate  Engagement in Group:  Engaged  Modes of Intervention:  Education  Additional Comments:     

## 2017-02-25 NOTE — Tx Team (Signed)
Initial Treatment Plan 02/25/2017 4:15 PM Glorianne J Herrera [id number]    PATIENT STRESSORS: Financial difficulties Substance abuse   PATIENT STRENGTHS: Ability for insight Average or above average intelligence Capable of independent living General fund of knowledge Motivation for treatment/growth   PATIENT IDENTIFIED PROBLEMS: Depression Suicidal thoughts "Drug addiction" "Some things I need to talk about"                     DISCHARGE CRITERIA:  Ability to meet basic life and health needs Improved stabilization in mood, thinking, and/or behavior Verbal commitment to aftercare and medication compliance  PRELIMINARY DISCHARGE PLAN: Attend aftercare/continuing care group Return to previous living arrangement  PATIENT/FAMILY INVOLVEMENT: This treatment plan has been presented to and reviewed with the patient, Kathleen Herrera, and/or family member, .  The patient and family have been given the opportunity to ask questions and make suggestions.  Padraic Marinos, [place], [place] 02/25/2017, 4:15 PM

## 2017-02-25 NOTE — ED Triage Notes (Signed)
[facility]  At 1330

## 2017-02-25 NOTE — Progress Notes (Signed)
Pt belongings that were locked up on admission were as follows: basket,lotion,coat, shoes,socks,hat,lighter, belt, do rag, pt belonging envelope sealed in ziplock bag, walking cane behind the curtain tagged.

## 2017-02-25 NOTE — Progress Notes (Signed)
Pt accepted to [facility], Bed 400[facility]2 Nira ConnJason Berry, NP is the accepting provider.  Dr.Cobos is the attending provider.  Call report to [phone]  [email or web address]  notified Pt is Voluntary.  Pt may be transported by [facility]  Pt scheduled  to [facility] at 1:30 PM.

## 2017-02-25 NOTE — ED Notes (Signed)
Inventory of pt belongings completed. Four (4) bags and pt's cane placed in Locker #4 in Pod F's dirty storage. Valuables envelope [id number] given to Security. Home medications recorded and placed with Main Pharmacy.

## 2017-02-25 NOTE — ED Notes (Signed)
Regular breakfast tray ordered.  

## 2017-02-25 NOTE — Progress Notes (Signed)
Patient states that she had a good day overall since she talked and laughed with the staff. As for the theme of the day, her wellness strategy will be to stop smoking and stop spending time with the bad people in her life.

## 2017-02-25 NOTE — Progress Notes (Signed)
D: Pt was in the dayroom upon initial approach.  Pt presents with depressed affect and mood.  She reports her day was "pretty good" and her goal is "thanking God for finding me place to do what I need to do."  Pt denies SI/HI, denies hallucinations.  Pt has been visible in milieu interacting with peers and staff appropriately.  Pt attended evening group.    A: Introduced self to pt.  Actively listened to pt and offered support and encouragement. Q15 minute safety checks maintained.  R: Pt is safe on the unit.  Pt verbally contracts for safety.  Will continue to monitor and assess.

## 2017-02-26 ENCOUNTER — Ambulatory Visit: Payer: Medicaid Other | Admitting: Family Medicine

## 2017-02-26 DIAGNOSIS — Z818 Family history of other mental and behavioral disorders: Secondary | ICD-10-CM

## 2017-02-26 DIAGNOSIS — F314 Bipolar disorder, current episode depressed, severe, without psychotic features: Principal | ICD-10-CM

## 2017-02-26 DIAGNOSIS — F1994 Other psychoactive substance use, unspecified with psychoactive substance-induced mood disorder: Secondary | ICD-10-CM

## 2017-02-26 DIAGNOSIS — Z810 Family history of intellectual disabilities: Secondary | ICD-10-CM

## 2017-02-26 DIAGNOSIS — F17210 Nicotine dependence, cigarettes, uncomplicated: Secondary | ICD-10-CM

## 2017-02-26 DIAGNOSIS — F1410 Cocaine abuse, uncomplicated: Secondary | ICD-10-CM

## 2017-02-26 MED ORDER — NICOTINE POLACRILEX 2 MG MT GUM
2.0000 mg | CHEWING_GUM | OROMUCOSAL | Status: DC | PRN
  Administered 2017-02-27 – 2017-03-01 (×2): 2 mg via ORAL
  Filled 2017-02-26: qty 1

## 2017-02-26 MED ORDER — PAROXETINE HCL 20 MG PO TABS
20.0000 mg | ORAL_TABLET | Freq: Every day | ORAL | Status: DC
  Administered 2017-02-26 – 2017-03-01 (×4): 20 mg via ORAL
  Filled 2017-02-26 (×6): qty 1

## 2017-02-26 MED ORDER — DARIFENACIN HYDROBROMIDE ER 7.5 MG PO TB24
7.5000 mg | ORAL_TABLET | Freq: Every day | ORAL | Status: DC
  Administered 2017-02-26 – 2017-03-01 (×4): 7.5 mg via ORAL
  Filled 2017-02-26 (×6): qty 1

## 2017-02-26 MED ORDER — MECLIZINE HCL 12.5 MG PO TABS
12.5000 mg | ORAL_TABLET | Freq: Two times a day (BID) | ORAL | Status: DC
  Administered 2017-02-26 – 2017-03-01 (×6): 12.5 mg via ORAL
  Filled 2017-02-26 (×10): qty 1

## 2017-02-26 MED ORDER — AMLODIPINE BESYLATE 5 MG PO TABS
5.0000 mg | ORAL_TABLET | Freq: Every day | ORAL | Status: DC
  Administered 2017-02-27 – 2017-03-01 (×3): 5 mg via ORAL
  Filled 2017-02-26 (×5): qty 1

## 2017-02-26 NOTE — Progress Notes (Signed)
Recreation Therapy Notes  Animal-Assisted Activity (AAA) Program Checklist/Progress Notes Patient Eligibility Criteria Checklist & Daily Group note for Rec TxIntervention  Date: 01.22.2019 Time: 2:45pm Location: [address] [facility]   AAA/T Program Assumption of Risk Form signed by Patient/ or Parent Legal Guardian Yes  Patient is free of allergies or sever asthma Yes  Patient reports no fear of animals Yes  Patient reports no history of cruelty to animals Yes  Patient understands his/her participation is voluntary Yes  Patient washes hands before animal contact Yes  Patient washes hands after animal contact Yes  Behavioral Response: Engaged, Attentive   Education:Hand Washing, Appropriate Animal Interaction   Education Outcome: Acknowledges education.   Clinical Observations/Feedback: Patient attended session and interacted appropriately with therapy dog and peers. Patient asked appropriate questions about therapy dog and his training.   Marykay Lexenise L Annaya Bangert, LRT/CTRS        Jearl KlinefelterBlanchfield, Christyne Mccain L 02/26/2017 4:06 PM

## 2017-02-26 NOTE — Progress Notes (Signed)
D: Pt was in her room with visitors upon initial approach.  Pt presents with anxious affect and mood.  She reports her day was "alright, I can't complain."  Reports "6 people came to see me, my Javanna Patin family.  I don't know what I'd do without them."  Pt denies SI/HI, denies hallucinations, denies pain.  Pt has been visible in milieu interacting with peers and staff appropriately.  Pt attended evening group.    A: Introduced self to pt.  Actively listened to pt and offered support and encouragement. Q15 minute safety checks maintained.  R: Pt is safe on the unit.  Pt verbally contracts for safety and reports she will inform staff of needs and concerns.  Will continue to monitor and assess.

## 2017-02-26 NOTE — Consult Note (Addendum)
  Phone consult with Dr Jama Flavorsobos (Psychiatrist)...  Patient was NOT seen by me today, however chart reviewed, and case discussed with attending physician Dr Jama Flavorsobos (psychiatrist) initially in the a.m. and then again around 12 noon after vital signs including orthostatic vitals were repeated .   Kathleen Herrera is a 57 year old with past medical history relevant for bipolar disorder/schizophrenic disorder/polysubstance abuse/hypertension/chronic vertigo for which he takes Ativert.  Patient complaining of dizziness/vertigo, psychiatric team requested input with regards to blood pressure medications.  Prior to admission patient was on amlodipine, Lasix and beta-blocker.  Urine drug screen on admission positive for cocaine, beta-blocker currently not being continued.  Orthostatic vitals reveal stable blood pressure with systolic blood pressure in the 120s, heart rate in the 60s mostly.  Please see full set of orthostatic vitals in the ED that were done at 10:17 AM, 10:22 AM and 1027 am.  Vitals:   02/26/17 1022 02/26/17 1027  BP: 121/65 128/70  Pulse: 68 73  Resp:    Temp:    SpO2: 99% 98%     Recommendations:-  1)HTN-BP appears stable, no evidence of orthostatic hypotension at this time, okay to reduce amlodipine to 5 mg daily from 10 mg daily, avoid beta-blockers due to ongoing cocaine use  2)Vertigo-this is not new, okay to continue Antivert as needed.  Orthostatic vitals are normal, patient current vertigo symptoms probably unrelated to his BP   please see EMR for updated orders.   D/w with Dr Jama Flavorsobos, recommendations as above  Hospitalist service will sign off at this time, please recall Hospitalist service if needed as noted above  Case d/w Dr Jama Flavorsobos  Shon Haleourage Joylynn Defrancesco, MD

## 2017-02-26 NOTE — [facility] Group Notes (Signed)
LCSW Group Therapy Note 02/26/2017 4:26 PM  Type of Therapy/Topic: Group Therapy: Feelings about Diagnosis  Participation Level: Active   Description of Group:  This group will allow patients to explore their thoughts and feelings about diagnoses they have received. Patients will be guided to explore their level of understanding and acceptance of these diagnoses. Facilitator will encourage patients to process their thoughts and feelings about the reactions of others to their diagnosis and will guide patients in identifying ways to discuss their diagnosis with significant others in their lives. This group will be process-oriented, with patients participating in exploration of their own experiences, giving and receiving support, and processing challenge from other group members.  Therapeutic Goals: 1. Patient will demonstrate understanding of diagnosis as evidenced by identifying two or more symptoms of the disorder 2. Patient will be able to express two feelings regarding the diagnosis 3. Patient will demonstrate their ability to communicate their needs through discussion and/or role play  Summary of Patient Progress:  Kathleen Herrera was engaged during the entire group session. She participated and contributed to the group's discussion.     Therapeutic Modalities:  Cognitive Behavioral Therapy Brief Therapy Feelings Identification    Cotey Rakes Catalina AntiguaWilliams LCSWA Clinical Social Worker

## 2017-02-26 NOTE — [facility] Group Notes (Signed)
Adult Psychoeducational Group Note  Date:  02/26/2017 Time:  10:36 PM  Group Topic/Focus:  Wrap-Up Group:   The focus of this group is to help patients review their daily goal of treatment and discuss progress on daily workbooks.  Participation Level:  Active  Participation Quality:  Appropriate and Attentive  Affect:  Appropriate  Cognitive:  Alert and Appropriate  Insight: Appropriate and Good  Engagement in Group:  Engaged  Modes of Intervention:  Discussion and Education  Additional Comments:  Pt attended and participated in wrap up group this evening. Pt told Clinical research associatewriter that they had an okay day because they were able to reach their goal, which was going to all of the meetings. A positive noted by the pt was that they had 6 of their church family come to visit them this evening. Pt spoke about possibly reloc[facility] to a new area [facility], which would be better for them.   Chrisandra NettersOctavia A Shaya Altamura 02/26/2017, 10:36 PM

## 2017-02-26 NOTE — Progress Notes (Signed)
Data. Patient denies SI/HI/AVH. Verbally contracts for safety on the unit and to come to staff before acting of any self harm thoughts/feelings. Patient interacting well with staff and other patients. Affect has been bright throughout the day and patient has been expressing positive, hopeful expressions of wanting to feel better. Patient does report feeling remorsefully. "I treated my fried horribly. I was high. I should never had done that." Patient has expressed fear over the hospitalist, "Messing with my blood pressure meds, without coming and talking to me. I take my blood pressure meds religiously. I don't mess with that. I don't need no doctor, who doesn't talk to me, changing things and then tomorrow my blood pressure is through the roof." Patient was able to express her fear in an appropriate way. On her self assessment patient reported,  3/10 for depression, 0/10 for hopelessness and 6/10 for hopelessness. Her goal for today is, "To trust the process and take what I can use in group." Action. Emotional support and encouragement offered. Education provided on medication, indications and side effect. Q 15 minute checks done for safety. Response. Safety on the unit maintained through 15 minute checks.  Medications taken as prescribed. Attended groups. Remained calm and appropriate through out shift.
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
## 2017-02-26 NOTE — BHH Group Notes (Signed)
Pt attended mindfulness group. Pt was alert and engaged in group exercise.

## 2017-02-27 DIAGNOSIS — F39 Unspecified mood [affective] disorder: Secondary | ICD-10-CM

## 2017-02-27 DIAGNOSIS — F419 Anxiety disorder, unspecified: Secondary | ICD-10-CM

## 2017-02-27 DIAGNOSIS — R450 Nervousness: Secondary | ICD-10-CM

## 2017-02-27 DIAGNOSIS — G4700 Insomnia, unspecified: Secondary | ICD-10-CM

## 2017-02-27 DIAGNOSIS — I10 Essential (primary) hypertension: Secondary | ICD-10-CM

## 2017-02-27 DIAGNOSIS — F1490 Cocaine use, unspecified, uncomplicated: Secondary | ICD-10-CM

## 2017-02-27 LAB — BASIC METABOLIC PANEL
Anion gap: 10 (ref 5–15)
BUN: 6 mg/dL (ref 6–20)
CALCIUM: 9.5 mg/dL (ref 8.9–10.3)
CO2: 29 mmol/L (ref 22–32)
CREATININE: 0.81 mg/dL (ref 0.44–1.00)
Chloride: 101 mmol/L (ref 101–111)
GLUCOSE: 202 mg/dL — AB (ref 65–99)
Potassium: 3.7 mmol/L (ref 3.5–5.1)
Sodium: 140 mmol/L (ref 135–145)

## 2017-02-27 LAB — TSH: TSH: 1.501 u[IU]/mL (ref 0.350–4.500)

## 2017-02-27 LAB — LITHIUM LEVEL: LITHIUM LVL: 0.28 mmol/L — AB (ref 0.60–1.20)

## 2017-02-27 MED ORDER — TRAZODONE HCL 50 MG PO TABS
50.0000 mg | ORAL_TABLET | Freq: Every evening | ORAL | Status: DC | PRN
  Administered 2017-02-27 – 2017-02-28 (×2): 50 mg via ORAL
  Filled 2017-02-27 (×2): qty 1

## 2017-02-27 NOTE — Progress Notes (Signed)
Recreation Therapy Notes  Date: 02/27/17 Time: 0930 Location: 300 Hall Dayroom  Group Topic: Stress Management  Goal Area(s) Addresses:  Patient will verbalize importance of using healthy stress management.  Patient will identify positive emotions associated with healthy stress management.   Intervention: Stress Management  Activity : Guided Imagery.  LRT introduced the stress management technique of guided imagery.  Patients were to follow along as LRT read a script on letting go of unnecessary baggage in order to embrace a new beginning.  Education: Stress Management, Discharge Planning.   Education Outcome: Acknowledges edcuation/In group clarification offered/Needs additional education  Clinical Observations/Feedback: Pt did not attend group.     Deaaron Fulghum, LRT/CTRS          Ismelda Weatherman A 02/27/2017 11:22 AM 

## 2017-02-27 NOTE — Plan of Care (Signed)
  Progressing Safety: Periods of time without injury will increase 02/27/2017 2158 - Progressing by Arrie Aranhurch, Adriyanna Christians J, RN Note Pt has not harmed self or others tonight.  She denies SI/HI and verbally contracts for safety.

## 2017-02-27 NOTE — Progress Notes (Signed)
D:Pt was anxious and worrying this morning over her blood pressure and not having her medication for blood pressure this morning. Pt talked with the MD and they agreed that pt's medication was discontinued at this time due to recommendations by the hospitalist. Pt has been out in the dayroom this afternoon interacting on the unit. She continues to use a wheelchair for support due to dizziness with vertigo.  A:Offered support, encouragement and 15 minute checks. R:Pt denies si and hi. Safety maintained on the unit.

## 2017-02-27 NOTE — BHH Group Notes (Signed)
Adult Psychoeducational Group Note  Date:  02/27/2017 Time:  11:07 PM  Group Topic/Focus:  Wrap-Up Group:   The focus of this group is to help patients review their daily goal of treatment and discuss progress on daily workbooks.  Participation Level:  Active  Participation Quality:  Appropriate and Attentive  Affect:  Appropriate  Cognitive:  Alert and Appropriate  Insight: Appropriate and Good  Engagement in Group:  Engaged  Modes of Intervention:  Discussion and Education  Additional Comments:  Pt attended and participated in wrap up group this evening. Pt had an okay day even though they were upset earlier in the day due to their BP meds being stopped. It caused the pt to miss AM group, but they felt better later once once they were laughing with other hall mates. Pt goal was to find out about their med changes and a positive was that the Dr was able to inform pt as to why changes were made.   Kathleen NettersOctavia A Allexa Herrera 02/27/2017, 11:07 PM

## 2017-02-27 NOTE — Progress Notes (Signed)
[facility] MD Progress Note  02/27/2017 1:58 PM Kathleen Herrera  MRN:  [id number]   Subjective:  Patient reports that she is doing better today, but is concerned about the changes in her BP medications. She feels that maybe the MD should not have changed her medications while she is here. She denies any SI/HI/AVH and contracts for safety. She denies any medication side effects. She does request Trazodone QHS for sleep.   Objective: Patient's charta and findings reviewed and discussed with treatment team. Patient presents in the hallway questioning me about her medications. She is demanding but it is understandable, since she reports that the MD did not come and ex[plain why he made the changes. Once she was educated per the MD's note, she was understanding. Will add Trazodone 50 mg QHS PRN. Will continue all other medications as prescribed. Patient's vitals have remained stable and patient denies any symptoms of medication changes.   Principal Problem: Bipolar 1 disorder, depressed, severe ([place]) Diagnosis:   Patient Active Problem List   Diagnosis Date Noted  . Bipolar 1 disorder, depressed, severe ([place]) [F31.4] 02/25/2017  . Ataxia [R27.0] 11/01/2016  . Episodic recurrent vertigo [R42]   . Dermatitis [L30.9]   . Asthma [J45.909]   . CAP (community acquired pneumonia) [J18.9] 09/19/2015  . Dyspnea [R06.00] 09/19/2015  . Acute respiratory failure with hypoxia ([place]) [J96.01] 09/19/2015  . Abnormality of gait [R26.9] 06/29/2014  . Essential hypertension [I10] 06/29/2014  . Hyperlipidemia [E78.5] 06/29/2014  . Metabolic syndrome [T36.46] 06/29/2014  . Neuropathic pain [M79.2] 06/29/2014  . Gastroesophageal reflux disease without esophagitis [K21.9] 06/29/2014  . Tachycardia [R00.0] 06/29/2014  . At risk for osteopenia [Z91.89] 06/29/2014  . Excessive daytime sleepiness [G47.19] 06/29/2014  . Fibromyalgia [M79.7]   . Abnormal LFTs [R94.5] 03/06/2013  . Extreme obesity [E66.8] 12/18/2012  .  Fatty infiltration of liver [K76.0] 07/15/2012  . Gonalgia [M25.569] 07/15/2012  . LBP (low back pain) [M54.5] 07/15/2012  . Arthritis, degenerative [M19.90] 06/12/2012  . Arthritis [M19.90] 05/26/2012  . Bipolar 2 disorder ([place]) [F31.81] 04/17/2012  . Clinical depression [F32.9] 04/17/2012   Total Time spent with patient: 25 minutes  Past Psychiatric History: See H&P  Past Medical History:  Past Medical History:  Diagnosis Date  . Arthritis   . Asthma   . 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 Bipolar disorder ([place])   . Bronchitis   . Depression   . Fibromyalgia Y-2  . Fibromyalgia unknown  . Hyperlipemia   . Hypertension   . Post traumatic stress disorder   . Sinus congestion     Past Surgical History:  Procedure Laterality Date  . ABDOMINAL HYSTERECTOMY     Family History:  Family History  Problem Relation Age of Onset  . Dementia Mother   . Prostate cancer Father   . Dementia Father   . Prostate cancer Other   . CAD Other    Family Psychiatric  History: See H&P Social History:  Social History   Substance and Sexual Activity  Alcohol Use Yes   Comment: occasionally     Social History   Substance and Sexual Activity  Drug Use Yes  . Types: "Crack" cocaine   Comment: crack    Social History   Socioeconomic History  . Marital status: Single    Spouse name: None  . Number of children: None  . Years of education: None  . Highest education level: None  Social Needs  . Financial resource strain: None  . Food insecurity - worry: None  .  Food insecurity - inability: None  . Transportation needs - medical: None  . Transportation needs - non-medical: None  Occupational History  . None  Tobacco Use  . Smoking status: Current Every Day Smoker    Packs/day: 0.25    Years: 37.00    Pack years: 9.25    Types: Cigarettes  . Smokeless tobacco: Never Used  Substance and Sexual Activity  . Alcohol use: Yes    Comment: occasionally  . Drug use: Yes    Types: "Crack"  cocaine    Comment: crack  . Sexual activity: Not Currently  Other Topics Concern  . None  Social History Narrative  . None   Additional Social History:                         Sleep: Good  Appetite:  Good  Current Medications: Current Facility-Administered Medications  Medication Dose Route Frequency Provider Last Rate Last Dose  . acetaminophen (TYLENOL) tablet 650 mg  650 mg Oral Q4H PRN Lindon Romp A, NP      . albuterol (PROVENTIL HFA;VENTOLIN HFA) 108 (90 Base) MCG/ACT inhaler 2 puff  2 puff Inhalation Q6H PRN Lindon Romp A, NP      . alum & mag hydroxide-simeth (MAALOX/MYLANTA) 200-200-20 MG/5ML suspension 30 mL  30 mL Oral Q4H PRN Lindon Romp A, NP   30 mL at 02/26/17 1658  . amLODipine (NORVASC) tablet 5 mg  5 mg Oral Daily Emokpae, Courage, MD   5 mg at 02/27/17 0811  . aspirin tablet 325 mg  325 mg Oral Daily Lindon Romp A, NP   325 mg at 02/27/17 0811  . darifenacin (ENABLEX) 24 hr tablet 7.5 mg  7.5 mg Oral Daily Sheryn Aldaz A, MD   7.5 mg at 02/27/17 0811  . furosemide (LASIX) tablet 20 mg  20 mg Oral Daily Lindon Romp A, NP   20 mg at 02/27/17 0811  . gabapentin (NEURONTIN) capsule 300 mg  300 mg Oral TID Lindon Romp A, NP   300 mg at 02/27/17 1207  . lamoTRIgine (LAMICTAL) tablet 25 mg  25 mg Oral BID Lindon Romp A, NP   25 mg at 02/27/17 2956  . lithium carbonate capsule 300 mg  300 mg Oral BID WC Lindon Romp A, NP   300 mg at 02/27/17 2130  . magnesium hydroxide (MILK OF MAGNESIA) suspension 30 mL  30 mL Oral Daily PRN Lindon Romp A, NP      . meclizine (ANTIVERT) tablet 12.5 mg  12.5 mg Oral BID Kiele Heavrin, Myer Peer, MD   12.5 mg at 02/27/17 8657  . nicotine polacrilex (NICORETTE) gum 2 mg  2 mg Oral PRN Maxxwell Edgett, Myer Peer, MD   2 mg at 02/27/17 1315  . pantoprazole (PROTONIX) EC tablet 20 mg  20 mg Oral BID Derrill Center, NP   20 mg at 02/27/17 0813  . PARoxetine (PAXIL) tablet 20 mg  20 mg Oral Daily Mariajose Mow, Myer Peer, MD   20 mg at 02/27/17  0813  . simvastatin (ZOCOR) tablet 20 mg  20 mg Oral q1800 Lindon Romp A, NP   20 mg at 02/26/17 1810  . traZODone (DESYREL) tablet 50 mg  50 mg Oral QHS PRN Money, Lowry Ram, FNP        Lab Results:  Results for orders placed or performed during the hospital encounter of 02/25/17 (from the past 48 hour(s))  Lithium level     Status: Abnormal  Collection Time: 02/27/17  7:19 AM  Result Value Ref Range   Lithium Lvl 0.28 (L) 0.60 - 1.20 mmol/L    Comment: Performed at [facility], [place] [address] [place], [place] [address]  TSH     Status: None   Collection Time: 02/27/17  7:19 AM  Result Value Ref Range   TSH 1.501 0.350 - 4.500 uIU/mL    Comment: Performed by a 3rd Generation assay with a functional sensitivity of <=0.01 uIU/mL. Performed at [facility], [place] [address] [place], [place] [address]   Basic metabolic panel     Status: Abnormal   Collection Time: 02/27/17  7:19 AM  Result Value Ref Range   Sodium 140 135 - 145 mmol/L   Potassium 3.7 3.5 - 5.1 mmol/L   Chloride 101 101 - 111 mmol/L   CO2 29 22 - 32 mmol/L   Glucose, Bld 202 (H) 65 - 99 mg/dL   BUN 6 6 - 20 mg/dL   Creatinine, Ser 0.81 0.44 - 1.00 mg/dL   Calcium 9.5 8.9 - 10.3 mg/dL   GFR calc non Af Amer >60 >60 mL/min   GFR calc Af Amer >60 >60 mL/min    Comment: (NOTE) The eGFR has been calculated using the CKD EPI equation. This calculation has not been validated in all clinical situations. eGFR's persistently <60 mL/min signify possible Chronic Kidney Disease.    Anion gap 10 5 - 15    Comment: Performed at [facility], [place] [address] [place], [place] [address]    Blood Alcohol level:  Lab Results  Component Value Date   ETH <10 03/54/6568    Metabolic Disorder Labs: Lab Results  Component Value Date   HGBA1C 6.4 (H) 11/01/2016   MPG 136.98 11/01/2016   MPG 126 11/18/2015   No results found for: PROLACTIN Lab Results  Component  Value Date   CHOL 204 (H) 11/01/2016   TRIG 266 (H) 11/01/2016   HDL 64 11/01/2016   CHOLHDL 3.2 11/01/2016   VLDL 53 (H) 11/01/2016   LDLCALC 87 11/01/2016   LDLCALC 95 09/05/2016    Physical Findings: AIMS: Facial and Oral Movements Muscles of Facial Expression: None, normal Lips and Perioral Area: None, normal Jaw: None, normal Tongue: None, normal,Extremity Movements Upper (arms, wrists, hands, fingers): None, normal Lower (legs, knees, ankles, toes): None, normal, Trunk Movements Neck, shoulders, hips: None, normal, Overall Severity Severity of abnormal movements (highest score from questions above): None, normal Incapacitation due to abnormal movements: None, normal Patient's awareness of abnormal movements (rate only patient's report): No Awareness, Dental Status Current problems with teeth and/or dentures?: No Does patient usually wear dentures?: No  CIWA:    COWS:     Musculoskeletal: Strength & Muscle Tone: within normal limits Gait & Station: normal Patient leans: N/A  Psychiatric Specialty Exam: Physical Exam  Nursing note and vitals reviewed. Constitutional: She is oriented to person, place, and time. She appears well-developed and well-nourished.  Cardiovascular: Normal rate.  Respiratory: Effort normal.  Neurological: She is alert and oriented to person, place, and time.  Skin: Skin is warm.    Review of Systems  Constitutional: Negative.   HENT: Negative.   Eyes: Negative.   Cardiovascular: Negative.   Gastrointestinal: Negative.   Genitourinary: Negative.   Skin: Negative.   Neurological: Negative.   Endo/Heme/Allergies: Negative.   Psychiatric/Behavioral: Positive for depression. Negative for hallucinations and suicidal ideas. The patient is nervous/anxious.     Blood pressure  131/82, pulse 81, temperature 98.7 F (37.1 C), temperature source Oral, resp. rate 16, height 5' 2.75" (1.594 m), weight 113.4 kg (250 lb), SpO2 98 %.Body mass index is  44.64 kg/m.  General Appearance: Casual  Eye Contact:  Good  Speech:  Clear and Coherent and Normal Rate  Volume:  Normal  Mood:  Depressed  Affect:  Flat  Thought Process:  Goal Directed and Descriptions of Associations: Intact  Orientation:  Full (Time, Place, and Person)  Thought Content:  WDL  Suicidal Thoughts:  No  Homicidal Thoughts:  No  Memory:  Immediate;   Good Recent;   Good Remote;   Good  Judgement:  Good  Insight:  Good  Psychomotor Activity:  Normal  Concentration:  Concentration: Good and Attention Span: Good  Recall:  Good  Fund of Knowledge:  Good  Language:  Good  Akathisia:  No  Handed:  Right  AIMS (if indicated):     Assets:  Communication Skills Desire for Improvement Financial Resources/Insurance Housing Physical Health Social Support Transportation  ADL's:  Intact  Cognition:  WNL  Sleep:  Number of Hours: 6   Problems Addressed: Bipolar I HTN  Treatment Plan Summary: Daily contact with patient to assess and evaluate symptoms and progress in treatment, Medication management and Plan is to:  -Start Trazodone 50 mg PO QHS PRN for insomnia -Continue Paxil 20 mg PO Daily for mood stability -Continue Lithium Carbonate 300 mg BIDWC for mood stability -Continue Lamictal 25 mg PO BID for mood stability -Continue Antivert 12.5 mg PO Daily -Continue Gabapentin 300 mg PO TID for withdrawal symptoms and agitation -Encourage group therapy participation  Lewis Shock, FNP 02/27/2017, 1:58 PM   Agree with NP Progress Note

## 2017-02-27 NOTE — Tx Team (Signed)
Interdisciplinary Treatment and Diagnostic Plan Update  02/27/2017 Time of Session: 1010 Kathleen Herrera MRN: [id number]  Principal Diagnosis: <principal problem not specified>  Secondary Diagnoses: Active Problems:   Essential hypertension   Bipolar 1 disorder, depressed, severe (HCC)   Current Medications:  Current Facility-Administered Medications  Medication Dose Route Frequency Provider Last Rate Last Dose  . acetaminophen (TYLENOL) tablet 650 mg  650 mg Oral Q4H PRN Nira Conn A, NP      . albuterol (PROVENTIL HFA;VENTOLIN HFA) 108 (90 Base) MCG/ACT inhaler 2 puff  2 puff Inhalation Q6H PRN Nira Conn A, NP      . alum & mag hydroxide-simeth (MAALOX/MYLANTA) 200-200-20 MG/5ML suspension 30 mL  30 mL Oral Q4H PRN Nira Conn A, NP   30 mL at 02/26/17 1658  . amLODipine (NORVASC) tablet 5 mg  5 mg Oral Daily Emokpae, Courage, MD   5 mg at 02/27/17 0811  . aspirin tablet 325 mg  325 mg Oral Daily Nira Conn A, NP   325 mg at 02/27/17 0811  . darifenacin (ENABLEX) 24 hr tablet 7.5 mg  7.5 mg Oral Daily Cobos, Fernando A, MD   7.5 mg at 02/27/17 0811  . furosemide (LASIX) tablet 20 mg  20 mg Oral Daily Nira Conn A, NP   20 mg at 02/27/17 0811  . gabapentin (NEURONTIN) capsule 300 mg  300 mg Oral TID Nira Conn A, NP   300 mg at 02/27/17 7829  . lamoTRIgine (LAMICTAL) tablet 25 mg  25 mg Oral BID Nira Conn A, NP   25 mg at 02/27/17 5621  . lithium carbonate capsule 300 mg  300 mg Oral BID WC Nira Conn A, NP   300 mg at 02/27/17 3086  . magnesium hydroxide (MILK OF MAGNESIA) suspension 30 mL  30 mL Oral Daily PRN Nira Conn A, NP      . meclizine (ANTIVERT) tablet 12.5 mg  12.5 mg Oral BID Cobos, Rockey Situ, MD   12.5 mg at 02/27/17 5784  . nicotine polacrilex (NICORETTE) gum 2 mg  2 mg Oral PRN Cobos, Rockey Situ, MD      . pantoprazole (PROTONIX) EC tablet 20 mg  20 mg Oral BID Oneta Rack, NP   20 mg at 02/27/17 0813  . PARoxetine (PAXIL) tablet 20 mg  20 mg  Oral Daily Cobos, Rockey Situ, MD   20 mg at 02/27/17 0813  . simvastatin (ZOCOR) tablet 20 mg  20 mg Oral q1800 Nira Conn A, NP   20 mg at 02/26/17 1810   PTA Medications: Medications Prior to Admission  Medication Sig Dispense Refill Last Dose  . albuterol (PROVENTIL HFA;VENTOLIN HFA) 108 (90 Base) MCG/ACT inhaler Inhale 2 puffs into the lungs every 6 (six) hours as needed for wheezing or shortness of breath. 1 Inhaler 3 unk  . amLODipine (NORVASC) 10 MG tablet Take 1 tablet (10 mg total) by mouth daily. 90 tablet 2 02/24/2017 at Unknown time  . aspirin 325 MG tablet Take 1 tablet (325 mg total) by mouth daily. 30 tablet 6 02/24/2017 at 0800  . betamethasone dipropionate (DIPROLENE) 0.05 % ointment Apply topically 2 (two) times daily. (Patient taking differently: Apply 1 application topically 2 (two) times daily as needed (rash). ) 90 g 1 unk  . budesonide-formoterol (SYMBICORT) 80-4.5 MCG/ACT inhaler Inhale 2 puffs into the lungs 2 (two) times daily. 1 Inhaler 3 02/24/2017 at Unknown time  . clotrimazole-betamethasone (LOTRISONE) cream Apply 1 application topically 2 (two) times daily. Groin  area. (Patient taking differently: Apply 1 application topically 2 (two) times daily as needed (rash). Groin area.) 90 g 0 unk  . cyclobenzaprine (FLEXERIL) 5 MG tablet Take 1 tablet (5 mg total) by mouth 3 (three) times daily as needed for muscle spasms. 30 tablet 0 unk  . dimenhyDRINATE (DRAMAMINE) 50 MG tablet Take 1 tablet (50 mg total) by mouth every 8 (eight) hours as needed. 30 tablet 0 unk  . furosemide (LASIX) 20 MG tablet Take 1 tablet (20 mg total) by mouth daily. 30 tablet 3 02/24/2017 at Unknown time  . gabapentin (NEURONTIN) 300 MG capsule TAKE 1 CAPSULE (300 MG TOTAL) BY MOUTH THREE TIMES DAILY. 90 capsule 3 02/24/2017 at Unknown time  . hydrOXYzine (ATARAX/VISTARIL) 25 MG tablet Take 25 mg by mouth 3 (three) times daily.   02/24/2017 at Unknown time  . lamoTRIgine (LAMICTAL) 25 MG tablet Take 25  mg by mouth 2 (two) times daily.    02/24/2017 at am  . lithium carbonate 300 MG capsule Take 300 mg by mouth 2 (two) times daily with a meal.    02/24/2017 at am  . meclizine (ANTIVERT) 12.5 MG tablet TAKE 1 TABLET (12.5 MG TOTAL) BY MOUTH TWO TIMES DAILY AS NEEDED FOR DIZZINESS. 30 tablet 1 unk  . metoprolol succinate (TOPROL-XL) 50 MG 24 hr tablet Take 2 tablets (100 mg total) by mouth daily. Take with or immediately following a meal. 180 tablet 2 02/24/2017 at 0800  . nystatin (MYCOSTATIN/NYSTOP) powder APPLY TWO SPRINKLES OF POWDER TO THE AFFECTED INNER THIGHS TO PREVENT ITCHING AND FUNGAL RASH 30 g 2 unk  . pantoprazole (PROTONIX) 40 MG tablet Take 1 tablet (40 mg total) by mouth 2 (two) times daily before a meal. 60 tablet 6 02/24/2017 at am  . PARoxetine (PAXIL) 20 MG tablet TAKE 1 TABLET (20 MG TOTAL) BY MOUTH 1 DAY OR 1 DOSE. (Patient taking differently: TAKE 1 TABLET (20 MG TOTAL) BY MOUTH DAILY) 30 tablet 3 02/24/2017 at Unknown time  . simvastatin (ZOCOR) 20 MG tablet TAKE ONE TABLET   (20 MG TOTAL ) BY MOUTH   AT BEDTIME 90 tablet 1 02/23/2017 at Unknown time  . solifenacin (VESICARE) 5 MG tablet Take 1 tablet (5 mg total) by mouth daily. 90 tablet 3 02/24/2017 at Unknown time    Patient Stressors: Financial difficulties Substance abuse  Patient Strengths: Ability for insight Average or above average intelligence Capable of independent living [employer] of knowledge Motivation for treatment/growth  Treatment Modalities: Medication Management, Group therapy, Case management,  1 to 1 session with clinician, Psychoeducation, Recreational therapy.   Physician Treatment Plan for Primary Diagnosis: <principal problem not specified> Long Term Goal(s): Improvement in symptoms so as ready for discharge Improvement in symptoms so as ready for discharge   Short Term Goals: Ability to identify changes in lifestyle to reduce recurrence of condition will improve Ability to maintain clinical  measurements within normal limits will improve Ability to identify triggers associated with substance abuse/mental health issues will improve  Medication Management: Evaluate patient's response, side effects, and tolerance of medication regimen.  Therapeutic Interventions: 1 to 1 sessions, Unit Group sessions and Medication administration.  Evaluation of Outcomes: Progressing  Physician Treatment Plan for Secondary Diagnosis: Active Problems:   Essential hypertension   Bipolar 1 disorder, depressed, severe (HCC)  Long Term Goal(s): Improvement in symptoms so as ready for discharge Improvement in symptoms so as ready for discharge   Short Term Goals: Ability to identify changes in lifestyle to  reduce recurrence of condition will improve Ability to maintain clinical measurements within normal limits will improve Ability to identify triggers associated with substance abuse/mental health issues will improve     Medication Management: Evaluate patient's response, side effects, and tolerance of medication regimen.  Therapeutic Interventions: 1 to 1 sessions, Unit Group sessions and Medication administration.  Evaluation of Outcomes: Progressing   RN Treatment Plan for Primary Diagnosis: <principal problem not specified> Long Term Goal(s): Knowledge of disease and therapeutic regimen to maintain health will improve  Short Term Goals: Ability to identify and develop effective coping behaviors will improve and Compliance with prescribed medications will improve  Medication Management: RN will administer medications as ordered by provider, will assess and evaluate patient's response and provide education to patient for prescribed medication. RN will report any adverse and/or side effects to prescribing provider.  Therapeutic Interventions: 1 on 1 counseling sessions, Psychoeducation, Medication administration, Evaluate responses to treatment, Monitor vital signs and CBGs as ordered,  Perform/monitor CIWA, COWS, AIMS and Fall Risk screenings as ordered, Perform wound care treatments as ordered.  Evaluation of Outcomes: Progressing   LCSW Treatment Plan for Primary Diagnosis: <principal problem not specified> Long Term Goal(s): Safe transition to appropriate next level of care at discharge, Engage patient in therapeutic group addressing interpersonal concerns.  Short Term Goals: Engage patient in aftercare planning with referrals and resources, Increase social support and Increase skills for wellness and recovery  Therapeutic Interventions: Assess for all discharge needs, 1 to 1 time with Social worker, Explore available resources and support systems, Assess for adequacy in community support network, Educate family and significant other(s) on suicide prevention, Complete Psychosocial Assessment, Interpersonal group therapy.  Evaluation of Outcomes: Progressing   Progress in Treatment: Attending groups: Yes. Participating in groups: Yes. Taking medication as prescribed: Yes. Toleration medication: Yes. Family/Significant other contact made: No, will contact:  na Patient understands diagnosis: Yes. Discussing patient identified problems/goals with staff: Yes. Medical problems stabilized or resolved: Yes. Denies suicidal/homicidal ideation: Yes. Issues/concerns per patient self-inventory: No. Other: none  New problem(s) identified: No, Describe:  none  New Short Term/Long Term Goal(s): Pt goal: "get my meds adjusted, I don't think they are working anymore."  Discharge Plan or Barriers:   Reason for Continuation of Hospitalization: Depression Medication stabilization  Estimated Length of Stay: 3-5 days.  Attendees: Patient:Kathleen Herrera 02/27/2017   Physician: Dr Jama Flavorsobos, MD 02/27/2017   Nursing: Waynetta SandyJan Wright, RN 02/27/2017   RN Care Manager: 02/27/2017   Social Worker: Daleen SquibbGreg Tannah Dreyfuss 02/27/2017   Recreational Therapist:  02/27/2017   Other:  02/27/2017   Other:   02/27/2017   Other: 02/27/2017     Scribe for Treatment Team: Lorri FrederickWierda, Haylea Schlichting Jon, LCSW 02/27/2017 10:57 AM

## 2017-02-27 NOTE — [facility] Suicide Risk Assessment (Signed)
[facility] INPATIENT:  Family/Significant Other Suicide Prevention Education  Suicide Prevention Education:  Education Completed; NO ONE  has been identified by the patient as the family member/significant other with whom the patient will be residing, and identified as the person(s) who will aid the patient in the event of a mental health crisis (suicidal ideations/suicide attempt).  With written consent from the patient, the family member/significant other has been provided the following suicide prevention education, prior to the and/or following the discharge of the patient.  The suicide prevention education provided includes the following:  Suicide risk factors  Suicide prevention and interventions  National Suicide Hotline telephone number  [facility] assessment telephone number  [facility]  [facility] and/or [facility]idential [facility] telephone number  Request made of family/significant other to:  Remove weapons (e.g., guns, rifles, knives), all items previously/currently identified as safety concern.    Remove drugs/medications (over-the-counter, prescriptions, illicit drugs), all items previously/currently identified as a safety concern.  The family member/significant other verbalizes understanding of the suicide prevention education information provided.  The family member/significant other agrees to remove the items of safety concern listed above.  SPE is not required. Kathleen Herrera did not endorse suicidal ideation during or prior to her stay at the hospital. CSW will continue to contact the patient's identified supports for collateral information.   Kathleen Herrera 02/27/2017, 10:22 AM

## 2017-02-27 NOTE — [facility] Group Notes (Signed)
[facility]      02/27/2017 2:29 PM  Type of Therapy: Mental Health Association Presentation  Participation Level: Active  Participation Quality: Attentive  Affect: Appropriate  Cognitive: Oriented  Insight: Developing/Improving  Engagement in Therapy: Engaged  Modes of Intervention: Discussion, Education and Socialization  Summary of Progress/Problems:  Mental Health Association (MHA) Speaker came to talk about his personal journey with mental health. The pt processed ways by which to relate to the speaker. MHA speaker provided handouts and educational information pertaining to groups and services offered by the [facility]. Pt was engaged in speaker's presentation and was receptive to resources provided.    Alcario DroughtJolan Treniya Lobb LCSWA Clinical Social Worker

## 2017-02-27 NOTE — Progress Notes (Signed)
D: Pt was in her room with visitors upon initial approach.  Pt presents with appropriate affect and mood.  Pt seen smiling on occasion.   She reports she was concerned because her "blood pressure meds" were adjusted "but the doctor explained to me why it was stopped and I feel better about it now."  Pt denies SI/HI, denies hallucinations.  Pt has been visible in milieu interacting with peers and staff appropriately.  Pt attended evening group.  She continues to use walker or wheelchair for ambulation.  A: Introduced self to pt.  Actively listened to pt and offered support and encouragement.   PRN medication administered for sleep.  Q15 minute safety checks maintained.  R: Pt is safe on the unit.  Pt is compliant with medication.  Pt verbally contracts for safety.  Will continue to monitor and assess.

## 2017-02-28 MED ORDER — LITHIUM CARBONATE 300 MG PO CAPS
300.0000 mg | ORAL_CAPSULE | ORAL | Status: DC
  Administered 2017-03-01 (×2): 300 mg via ORAL
  Filled 2017-02-28 (×3): qty 1

## 2017-02-28 MED ORDER — LITHIUM CARBONATE 300 MG PO CAPS
600.0000 mg | ORAL_CAPSULE | Freq: Every day | ORAL | Status: DC
  Administered 2017-02-28: 600 mg via ORAL
  Filled 2017-02-28 (×3): qty 2

## 2017-02-28 MED ORDER — HYDROXYZINE HCL 25 MG PO TABS: 25.0000 mg | ORAL_TABLET | Freq: Three times a day (TID) | ORAL | Status: DC | PRN

## 2017-02-28 NOTE — Progress Notes (Signed)
[facility] MD Progress Note  02/28/2017 7:45 AM Kathleen Herrera  MRN:  [id number]   Subjective: patient reports she is feeling partially better, although still depressed, anxious regarding recent substance abuse and regarding her current living situation, which she states is sub-optimal . She presents future oriented today, and states she is hoping to be able to go to a rehab or residential setting at discharge. Denies medication side effects.  She expresses some ongoing concerns about changes in antihypertensive medication management, but reassured by explanation as to reason for changes - we reviewed potential dangers associated with use of B Blocker and Cocaine . Patient expressed understanding .   Objective: Case reviewed with treatment team and patient seen  She presents with improving mood and range of affect, but states she remains anxious . At this time, as above, focusing more on disposition planning options . Denies medication side effects.  Li level is subtherapeutic at 0.28. Patient states she feels Kathleen Herrera has been effective medication for her, denies side effects, and states she has been on higher doses in the past without side effects. No disruptive or agitated behaviors on unit. Visible in group room, interactive with peers. Denies suicidal ideations at present.    Principal Problem: Bipolar 1 disorder, depressed, severe ([place]) Diagnosis:   Patient Active Problem List   Diagnosis Date Noted  . Bipolar 1 disorder, depressed, severe ([place]) [F31.4] 02/25/2017  . Ataxia [R27.0] 11/01/2016  . Episodic recurrent vertigo [R42]   . Dermatitis [L30.9]   . Asthma [J45.909]   . CAP (community acquired pneumonia) [J18.9] 09/19/2015  . Dyspnea [R06.00] 09/19/2015  . Acute respiratory failure with hypoxia ([place]) [J96.01] 09/19/2015  . Abnormality of gait [R26.9] 06/29/2014  . Essential hypertension [I10] 06/29/2014  . Hyperlipidemia [E78.5] 06/29/2014  . Metabolic syndrome [G40.10] 06/29/2014   . Neuropathic pain [M79.2] 06/29/2014  . Gastroesophageal reflux disease without esophagitis [K21.9] 06/29/2014  . Tachycardia [R00.0] 06/29/2014  . At risk for osteopenia [Z91.89] 06/29/2014  . Excessive daytime sleepiness [G47.19] 06/29/2014  . Fibromyalgia [M79.7]   . Abnormal LFTs [R94.5] 03/06/2013  . Extreme obesity [E66.8] 12/18/2012  . Fatty infiltration of liver [K76.0] 07/15/2012  . Gonalgia [M25.569] 07/15/2012  . LBP (low back pain) [M54.5] 07/15/2012  . Arthritis, degenerative [M19.90] 06/12/2012  . Arthritis [M19.90] 05/26/2012  . Bipolar 2 disorder ([place]) [F31.81] 04/17/2012  . Clinical depression [F32.9] 04/17/2012   Total Time spent with patient: 25 minutes  Past Psychiatric History: See H&P  Past Medical History:  Past Medical History:  Diagnosis Date  . Arthritis   . Asthma   . 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 Bipolar disorder ([place])   . Bronchitis   . Depression   . Fibromyalgia Y-2  . Fibromyalgia unknown  . Hyperlipemia   . Hypertension   . Post traumatic stress disorder   . Sinus congestion     Past Surgical History:  Procedure Laterality Date  . ABDOMINAL HYSTERECTOMY     Family History:  Family History  Problem Relation Age of Onset  . Dementia Mother   . Prostate cancer Father   . Dementia Father   . Prostate cancer Other   . CAD Other    Family Psychiatric  History: See H&P Social History:  Social History   Substance and Sexual Activity  Alcohol Use Yes   Comment: occasionally     Social History   Substance and Sexual Activity  Drug Use Yes  . Types: "Crack" cocaine   Comment: crack  Social History   Socioeconomic History  . Marital status: Single    Spouse name: None  . Number of children: None  . Years of education: None  . Highest education level: None  Social Needs  . Financial resource strain: None  . Food insecurity - worry: None  . Food insecurity - inability: None  . Transportation needs - medical: None  .  Transportation needs - non-medical: None  Occupational History  . None  Tobacco Use  . Smoking status: Current Every Day Smoker    Packs/day: 0.25    Years: 37.00    Pack years: 9.25    Types: Cigarettes  . Smokeless tobacco: Never Used  Substance and Sexual Activity  . Alcohol use: Yes    Comment: occasionally  . Drug use: Yes    Types: "Crack" cocaine    Comment: crack  . Sexual activity: Not Currently  Other Topics Concern  . None  Social History Narrative  . None   Additional Social History:   Sleep: improved   Appetite:  improved  Current Medications: Current Facility-Administered Medications  Medication Dose Route Frequency Provider Last Rate Last Dose  . acetaminophen (TYLENOL) tablet 650 mg  650 mg Oral Q4H PRN Lindon Romp A, NP      . albuterol (PROVENTIL HFA;VENTOLIN HFA) 108 (90 Base) MCG/ACT inhaler 2 puff  2 puff Inhalation Q6H PRN Lindon Romp A, NP      . alum & mag hydroxide-simeth (MAALOX/MYLANTA) 200-200-20 MG/5ML suspension 30 mL  30 mL Oral Q4H PRN Lindon Romp A, NP   30 mL at 02/26/17 1658  . amLODipine (NORVASC) tablet 5 mg  5 mg Oral Daily Emokpae, Courage, MD   5 mg at 02/27/17 0811  . aspirin tablet 325 mg  325 mg Oral Daily Lindon Romp A, NP   325 mg at 02/27/17 0811  . darifenacin (ENABLEX) 24 hr tablet 7.5 mg  7.5 mg Oral Daily Cobos, Fernando A, MD   7.5 mg at 02/27/17 0811  . furosemide (LASIX) tablet 20 mg  20 mg Oral Daily Lindon Romp A, NP   20 mg at 02/27/17 0811  . gabapentin (NEURONTIN) capsule 300 mg  300 mg Oral TID Lindon Romp A, NP   300 mg at 02/27/17 1655  . lamoTRIgine (LAMICTAL) tablet 25 mg  25 mg Oral BID Lindon Romp A, NP   25 mg at 02/27/17 1654  . lithium carbonate capsule 300 mg  300 mg Oral BID WC Lindon Romp A, NP   300 mg at 02/27/17 1654  . magnesium hydroxide (MILK OF MAGNESIA) suspension 30 mL  30 mL Oral Daily PRN Lindon Romp A, NP      . meclizine (ANTIVERT) tablet 12.5 mg  12.5 mg Oral BID Cobos, Myer Peer,  MD   12.5 mg at 02/27/17 1654  . nicotine polacrilex (NICORETTE) gum 2 mg  2 mg Oral PRN Cobos, Myer Peer, MD   2 mg at 02/27/17 1315  . pantoprazole (PROTONIX) EC tablet 20 mg  20 mg Oral BID Derrill Center, NP   20 mg at 02/27/17 1654  . PARoxetine (PAXIL) tablet 20 mg  20 mg Oral Daily Cobos, Myer Peer, MD   20 mg at 02/27/17 0813  . simvastatin (ZOCOR) tablet 20 mg  20 mg Oral q1800 Lindon Romp A, NP   20 mg at 02/27/17 1818  . traZODone (DESYREL) tablet 50 mg  50 mg Oral QHS PRN Money, Lowry Ram, FNP   50 mg  at 02/27/17 2104    Lab Results:  Results for orders placed or performed during the hospital encounter of 02/25/17 (from the past 48 hour(s))  Lithium level     Status: Abnormal   Collection Time: 02/27/17  7:19 AM  Result Value Ref Range   Lithium Lvl 0.28 (L) 0.60 - 1.20 mmol/L    Comment: Performed at [facility], [place] [address], [place], [place] [address]  TSH     Status: None   Collection Time: 02/27/17  7:19 AM  Result Value Ref Range   TSH 1.501 0.350 - 4.500 uIU/mL    Comment: Performed by a 3rd Generation assay with a functional sensitivity of <=0.01 uIU/mL. Performed at [facility], [place] [address] [place], [place] [address]   Basic metabolic panel     Status: Abnormal   Collection Time: 02/27/17  7:19 AM  Result Value Ref Range   Sodium 140 135 - 145 mmol/L   Potassium 3.7 3.5 - 5.1 mmol/L   Chloride 101 101 - 111 mmol/L   CO2 29 22 - 32 mmol/L   Glucose, Bld 202 (H) 65 - 99 mg/dL   BUN 6 6 - 20 mg/dL   Creatinine, Ser 0.81 0.44 - 1.00 mg/dL   Calcium 9.5 8.9 - 10.3 mg/dL   GFR calc non Af Amer >60 >60 mL/min   GFR calc Af Amer >60 >60 mL/min    Comment: (NOTE) The eGFR has been calculated using the CKD EPI equation. This calculation has not been validated in all clinical situations. eGFR's persistently <60 mL/min signify possible Chronic Kidney Disease.    Anion gap 10 5 - 15    Comment: Performed at [facility], [place] [address] [place], [place] [address]    Blood Alcohol level:  Lab Results  Component Value Date   ETH <10 60/73/7106    Metabolic Disorder Labs: Lab Results  Component Value Date   HGBA1C 6.4 (H) 11/01/2016   MPG 136.98 11/01/2016   MPG 126 11/18/2015   No results found for: PROLACTIN Lab Results  Component Value Date   CHOL 204 (H) 11/01/2016   TRIG 266 (H) 11/01/2016   HDL 64 11/01/2016   CHOLHDL 3.2 11/01/2016   VLDL 53 (H) 11/01/2016   LDLCALC 87 11/01/2016   LDLCALC 95 09/05/2016    Physical Findings: AIMS: Facial and Oral Movements Muscles of Facial Expression: None, normal Lips and Perioral Area: None, normal Jaw: None, normal Tongue: None, normal,Extremity Movements Upper (arms, wrists, hands, fingers): None, normal Lower (legs, knees, ankles, toes): None, normal, Trunk Movements Neck, shoulders, hips: None, normal, Overall Severity Severity of abnormal movements (highest score from questions above): None, normal Incapacitation due to abnormal movements: None, normal Patient's awareness of abnormal movements (rate only patient's report): No Awareness, Dental Status Current problems with teeth and/or dentures?: No Does patient usually wear dentures?: No  CIWA:    COWS:     Musculoskeletal: Strength & Muscle Tone: within normal limits Gait & Station: ambulates in wheel chair  Patient leans: N/A  Psychiatric Specialty Exam: Physical Exam  Nursing note and vitals reviewed. Constitutional: She is oriented to person, place, and time. She appears well-developed and well-nourished.  Cardiovascular: Normal rate.  Respiratory: Effort normal.  Neurological: She is alert and oriented to person, place, and time.  Skin: Skin is warm.    Review of Systems  Constitutional: Negative.   HENT: Negative.   Eyes: Negative.   Cardiovascular: Negative.  Gastrointestinal: Negative.   Genitourinary: Negative.   Skin: Negative.    Neurological: Negative.   Endo/Heme/Allergies: Negative.   Psychiatric/Behavioral: Positive for depression. Negative for hallucinations and suicidal ideas. The patient is nervous/anxious.   denies headache, denies chest pain, no dyspnea   Blood pressure (!) 145/82, pulse 89, temperature 98.6 F (37 C), temperature source Oral, resp. rate 12, height 5' 2.75" (1.594 m), weight 113.4 kg (250 lb), SpO2 98 %.Body mass index is 44.64 kg/m.  General Appearance: Fairly Groomed  Eye Contact:  Good  Speech:  Normal Rate  Volume:  Normal  Mood:  less depressed , reports partially improved mood   Affect:  more reactive, smiles at times appropriately, vaguely anxious   Thought Process:  Linear and Descriptions of Associations: Intact  Orientation:  Other:  fully alert and attentive  Thought Content:  no hallucinations, no delusions, not internally preoccupied   Suicidal Thoughts:  No today denies suicidal plans or intention and contracts for safety on unit, denies homicidal or violent ideations  Homicidal Thoughts:  No  Memory:  recent and remote grossly intact   Judgement:  Fair- improving   Insight:  fair- improving  Psychomotor Activity:  Normal  Concentration:  Concentration: Good and Attention Span: Good  Recall:  Good  Fund of Knowledge:  Good  Language:  Good  Akathisia:  No  Handed:  Right  AIMS (if indicated):     Assets:  Communication Skills Desire for Improvement Financial Resources/Insurance Housing Physical Health Social Support Transportation  ADL's:  Intact  Cognition:  WNL  Sleep:  Number of Hours: 6   Assessment- patient is presenting with improving mood and range of affect . She presents more future oriented, and currently more focused on disposition planning , stating that she is wanting to go to a residential type setting rather than returning to her prior living arrangement, as she states it is not optimal or conducive to her abstinence , recovery efforts. Denies SI  . Tolerating medications well - Li level subtherapeutic .   Treatment Plan Summary: Treatment Plan reviewed as below 1/24 Daily contact with patient to assess and evaluate symptoms and progress in treatment, Medication management and Plan is to:  -Encourage efforts to work on sobriety and relapse prevention  -Continue Trazodone 50 mg PO QHS PRN for insomnia -Continue Paxil 20 mg PO Daily for depression and anxiety  -Increase  Lithium Carbonate to 300 mg QAM and 600 mgrs QHS  for mood disorder -Continue Lamictal 25 mg PO BID for mood disorder  -Continue Antivert 12.5 mg PO Daily -Continue Gabapentin 300 mg PO TID for anxiety  -Encourage group therapy participation to work on Radiographer, therapeutic and symptom reduction -Treatment team working on disposition planning- patient interested in [place] program.   Jenne Campus, MD 02/28/2017, 7:45 AM   Patient ID: Kathleen Herrera, female   DOB: [birth year], 57 y.o.   MRN: [id number]

## 2017-02-28 NOTE — [facility] Group Notes (Signed)
[facility] LCSW Group Therapy Note  Date/Time: 02/28/17, 1315  Type of Therapy/Topic:  Group Therapy:  Balance in Life  Participation Level:  active  Description of Group:    This group will address the concept of balance and how it feels and looks when one is unbalanced. Patients will be encouraged to process areas in their lives that are out of balance, and identify reasons for remaining unbalanced. Facilitators will guide patients utilizing problem- solving interventions to address and correct the stressor making their life unbalanced. Understanding and applying boundaries will be explored and addressed for obtaining  and maintaining a balanced life. Patients will be encouraged to explore ways to assertively make their unbalanced needs known to significant others in their lives, using other group members and facilitator for support and feedback.  Therapeutic Goals: 1. Patient will identify two or more emotions or situations they have that consume much of in their lives. 2. Patient will identify signs/triggers that life has become out of balance:  3. Patient will identify two ways to set boundaries in order to achieve balance in their lives:  4. Patient will demonstrate ability to communicate their needs through discussion and/or role plays  Summary of Patient Progress: Pt identified physical, financial, and mental/emotional as areas that are out of balance and shared how she is moving in order to improve her financial situation.  Good participation.          Therapeutic Modalities:   Cognitive Behavioral Therapy Solution-Focused Therapy Assertiveness Training  Daleen SquibbGreg Sharisa Toves, [place]CS[place]

## 2017-02-28 NOTE — [facility] Group Notes (Signed)
Orientation group was facilitated this morning. Writer discussed rules and expectations of the unit. Writer discussed and passed out self inventory sheets. Writer asked if any patients needed bus passes and/or notes for work or school. Writer discussed the schedule for the day and explained the process for laundry, phone times, and meal times. Writer ask if patients had any questions or concerns about medications, discharge, etc. Writer ask for each patient to give one goal they wanted to work on today. Pt stated to work on her addiction and depression coping skills to use to deter her from using. Group was closed by writer collecting self inventory sheets and explaining what was next on the schedule. Group adjourned.
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
## 2017-02-28 NOTE — Plan of Care (Signed)
Patient verbalizes understanding of information, education provided. 

## 2017-03-01 MED ORDER — TRAZODONE HCL 50 MG PO TABS: 50.0000 mg | ORAL_TABLET | Freq: Every evening | ORAL | 0 refills | Status: DC | PRN

## 2017-03-01 MED ORDER — LITHIUM CARBONATE 300 MG PO CAPS: 300.0000 mg | ORAL_CAPSULE | ORAL | 0 refills | Status: AC

## 2017-03-01 MED ORDER — LAMOTRIGINE 25 MG PO TABS: 25.0000 mg | ORAL_TABLET | Freq: Two times a day (BID) | ORAL | 0 refills | Status: AC

## 2017-03-01 MED ORDER — DARIFENACIN HYDROBROMIDE ER 7.5 MG PO TB24: 7.5000 mg | ORAL_TABLET | Freq: Every day | ORAL | 0 refills | Status: DC

## 2017-03-01 MED ORDER — HYDROXYZINE HCL 25 MG PO TABS: 25.0000 mg | ORAL_TABLET | Freq: Three times a day (TID) | ORAL | 0 refills | Status: DC | PRN

## 2017-03-01 MED ORDER — LITHIUM CARBONATE 600 MG PO CAPS: 600.0000 mg | ORAL_CAPSULE | Freq: Every day | ORAL | 0 refills | Status: DC
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
## 2017-03-01 NOTE — [facility] Suicide Risk Assessment (Signed)
[facility] Discharge Suicide Risk Assessment   Principal Problem: Bipolar 1 disorder, depressed, severe (HCC) Discharge Diagnoses:  Patient Active Problem List   Diagnosis Date Noted  . Bipolar 1 disorder, depressed, severe (HCC) [F31.4] 02/25/2017  . Ataxia [R27.0] 11/01/2016  . Episodic recurrent vertigo [R42]   . Dermatitis [L30.9]   . Asthma [J45.909]   . CAP (community acquired pneumonia) [J18.9] 09/19/2015  . Dyspnea [R06.00] 09/19/2015  . Acute respiratory failure with hypoxia (HCC) [J96.01] 09/19/2015  . Abnormality of gait [R26.9] 06/29/2014  . Essential hypertension [I10] 06/29/2014  . Hyperlipidemia [E78.5] 06/29/2014  . Metabolic syndrome [E88.81] 06/29/2014  . Neuropathic pain [M79.2] 06/29/2014  . Gastroesophageal reflux disease without esophagitis [K21.9] 06/29/2014  . Tachycardia [R00.0] 06/29/2014  . At risk for osteopenia [Z91.89] 06/29/2014  . Excessive daytime sleepiness [G47.19] 06/29/2014  . Fibromyalgia [M79.7]   . Abnormal LFTs [R94.5] 03/06/2013  . Extreme obesity [E66.8] 12/18/2012  . Fatty infiltration of liver [K76.0] 07/15/2012  . Gonalgia [M25.569] 07/15/2012  . LBP (low back pain) [M54.5] 07/15/2012  . Arthritis, degenerative [M19.90] 06/12/2012  . Arthritis [M19.90] 05/26/2012  . Bipolar 2 disorder (HCC) [F31.81] 04/17/2012  . Clinical depression [F32.9] 04/17/2012    Total Time spent with patient: 30 minutes  Musculoskeletal: Strength & Muscle Tone: within normal limits Gait & Station: normal- patient now ambulating without assistance, gait steady  Patient leans: N/A  Psychiatric Specialty Exam: ROS reports improved lightheadedness, improved dizziness ,denies chest pain, no shortness of breath, no vomiting , no fever   Blood pressure (!) 141/82, pulse 89, temperature 98.1 F (36.7 C), temperature source Oral, resp. rate 16, height 5' 2.75" (1.594 m), weight 113.4 kg (250 lb), SpO2 98 %.Body mass index is 44.64 kg/m.  General Appearance:  improving grooming   Eye Contact::  Good  Speech:  Normal Rate409  Volume:  Normal  Mood:  improving mood, presents euthymic at this time  Affect:  Appropriate and fuller in range   Thought Process:  Linear and Descriptions of Associations: Intact  Orientation:  Other:  fully alert and attentive   Thought Content:  denies hallucinations, no delusions, not internally preoccupied   Suicidal Thoughts:  No denies suicidal or self injurious ideations   Homicidal Thoughts:  No denies homicidal or violent ideations  Memory:  recent and remote grossly intact   Judgement:  Other:  improving   Insight:  improving   Psychomotor Activity:  Normal- improved gait   Concentration:  Good  Recall:  Good  Fund of Knowledge:Good  Language: Good  Akathisia:  Negative  Handed:  Right  AIMS (if indicated):     Assets:  Communication Skills Desire for Improvement Resilience  Sleep:  Number of Hours: 6.75  Cognition: WNL  ADL's:  Intact   Mental Status Per Nursing Assessment::   On Admission:     Demographic Factors:  57 year old female , separated, no children, was living with landlord prior to admission but plans to go to [facility] at discharge   Loss Factors: housing issues, on disability  Historical Factors: History of prior psychiatric admissions , but " not in many years ". States she has never attempted suicide . History of Cocaine Use Disorder , History of Alcohol Use Disorder   Risk Reduction Factors:   Positive coping skills or problem solving skills  Continued Clinical Symptoms:  At this time patient is alert, attentive, well related, mood improved and currently presents with improving mood and full range of affect, no  thought disorder, no suicidal or self injurious ideations, no homicidal ideations, no psychotic symptoms,future oriented . Denies medication side effects. We reviewed medication side effects, including potential symptoms of lithium toxicity .  Behavior on unit  in good control, pleasant on approach.     Cognitive Features That Contribute To Risk:  No gross cognitive deficits noted upon discharge. Is alert , attentive, and oriented x 3    Suicide Risk:  Mild:  Suicidal ideation of limited frequency, intensity, duration, and specificity.  There are no identifiable plans, no associated intent, mild dysphoria and related symptoms, good self-control (both objective and subjective assessment), few other risk factors, and identifiable protective factors, including available and accessible social support.  Follow-up Information    [facility] Follow up.   Specialty:  Behavioral Health Why:  Appointment is Thursday, 03/07/17, at 8:45am. You will meet with your current provider. Please be on time.   Contact information: [address] [place] [address] [phone]        [facility] Follow up.   Why:  Please go to the main office after discharge and speak with Helaine Chessracy Holt.  Contact information: [address] [place] [address] [phone]           Plan Of Care/Follow-up recommendations:  Activity:  as tolerated Diet:  heart healthy Tests:  NA Other:  As Below   Patient is leaving unit in good spirits , plans to follow up as above . She has a PCP, [facility], for management of medical issues as needed .  Craige CottaFernando A Cobos, MD 03/01/2017, 11:11 AM

## 2017-03-01 NOTE — Progress Notes (Signed)
Patient completed her daily assessment and on this she wrote  she denied SI today and she rated her depression, hopelessness and anxiety " 0/0/3", respectively. All dc instructions are reviewed with pt, she states she understands and that she will keep these appts that have been scheduled for her. ALL belongings are returned to pt and she is escorted to bldg entrance and dc'd per poc.

## 2017-03-01 NOTE — Progress Notes (Signed)
Recreation Therapy Notes  Date: 03/01/17 Time: 0930 Location: [address] Dayroom  Group Topic: Stress Management  Goal Area(s) Addresses:  Patient will verbalize importance of using healthy stress management.  Patient will identify positive emotions associated with healthy stress management.   Intervention: Stress Management  Activity : Progressive Muscle Relaxation.  LRT introduced the stress management technique of progressive muscle relaxation.  LRT led patients through the technique which allowed them to tense and relax each muscle group individually.  Education:  Stress Management, Discharge Planning.   Education Outcome: Acknowledges edcuation/In group clarification offered/Needs additional education  Clinical Observations/Feedback: Pt did not attend group.     Humphrey Guerreiro, LRT/CTRS         Delena Casebeer A 03/01/2017 10:57 AM 

## 2017-03-01 NOTE — Progress Notes (Signed)
  [facility] Adult Case Management Discharge Plan :  Will you be returning to the same living situation after discharge:  No. [facility]  At discharge, do you have transportation home?: Yes,  Valora Corporalracie Holt, Director of [facility], will pick up.  Do you have the ability to pay for your medications: Yes,  Medicaid   Release of information consent forms completed and in the chart;  Patient's signature needed at discharge.  Patient to Follow up at: Follow-up Information    [facility] Follow up.   Specialty:  Behavioral Health Why:  Appointment is Thursday, 03/07/17, at 8:45am. You will meet with your current provider. Please be on time.   Contact information: [address] [place] [address] [phone]        [facility] Follow up.   Why:  Please go to the main office after discharge and speak with Helaine Chessracy Holt.  Contact information: [address] [place] [address] [phone]           Next level of care provider has access to [facility] Link:yes  Safety Planning and Suicide Prevention discussed: Yes,  with the patient   Have you used any form of tobacco in the last 30 days? (Cigarettes, Smokeless Tobacco, Cigars, and/or Pipes): Yes  Has patient been referred to the Quitline?: Patient refused referral  Patient has been referred for addiction treatment: Pt. refused referral  Maeola SarahJolan E Dorathy Stallone, LCSWA 03/01/2017, 11:00 AM

## 2017-03-01 NOTE — Progress Notes (Signed)
Pt has been in the bed most of the evening.  She came out for snack and to watch TV for a little while.  When writer went to her room to ask her about coming to get her meds, she asked if she had missed group, and said that she intended to come to group.  Writer apologized and told pt that staff would be more forward in seeing that she was awake to attend groups.  Pt took her evening meds and was also given Trazodone for sleep.  Support and encouragement offered.  Discharge plans are in process.  Safety maintained with q15 minute checks.

## 2017-03-11 ENCOUNTER — Ambulatory Visit: Payer: Self-pay | Admitting: Family Medicine

## 2017-03-17 ENCOUNTER — Emergency Department (HOSPITAL_COMMUNITY): Payer: Medicaid Other

## 2017-03-17 ENCOUNTER — Other Ambulatory Visit: Payer: Self-pay

## 2017-03-17 ENCOUNTER — Encounter (HOSPITAL_COMMUNITY): Payer: Self-pay | Admitting: *Deleted

## 2017-03-17 ENCOUNTER — Inpatient Hospital Stay (HOSPITAL_COMMUNITY)
Admission: EM | Admit: 2017-03-17 | Discharge: 2017-03-20 | DRG: 193 | Disposition: A | Discharge status: Home / Self Care | Payer: Medicaid Other | Attending: Internal Medicine | Admitting: Internal Medicine

## 2017-03-17 DIAGNOSIS — M797 Fibromyalgia: Secondary | ICD-10-CM | POA: Diagnosis present

## 2017-03-17 DIAGNOSIS — F3181 Bipolar II disorder: Secondary | ICD-10-CM | POA: Diagnosis present

## 2017-03-17 DIAGNOSIS — A419 Sepsis, unspecified organism: Secondary | ICD-10-CM

## 2017-03-17 DIAGNOSIS — Z79899 Other long term (current) drug therapy: Secondary | ICD-10-CM | POA: Diagnosis not present

## 2017-03-17 DIAGNOSIS — E785 Hyperlipidemia, unspecified: Secondary | ICD-10-CM | POA: Diagnosis present

## 2017-03-17 DIAGNOSIS — F4310 Post-traumatic stress disorder, unspecified: Secondary | ICD-10-CM | POA: Diagnosis present

## 2017-03-17 DIAGNOSIS — J189 Pneumonia, unspecified organism: Secondary | ICD-10-CM | POA: Diagnosis present

## 2017-03-17 DIAGNOSIS — K219 Gastro-esophageal reflux disease without esophagitis: Secondary | ICD-10-CM | POA: Diagnosis present

## 2017-03-17 DIAGNOSIS — G629 Polyneuropathy, unspecified: Secondary | ICD-10-CM | POA: Diagnosis present

## 2017-03-17 DIAGNOSIS — Z7982 Long term (current) use of aspirin: Secondary | ICD-10-CM

## 2017-03-17 DIAGNOSIS — J45909 Unspecified asthma, uncomplicated: Secondary | ICD-10-CM | POA: Diagnosis present

## 2017-03-17 DIAGNOSIS — J9601 Acute respiratory failure with hypoxia: Secondary | ICD-10-CM | POA: Diagnosis present

## 2017-03-17 DIAGNOSIS — Z7951 Long term (current) use of inhaled steroids: Secondary | ICD-10-CM

## 2017-03-17 DIAGNOSIS — F419 Anxiety disorder, unspecified: Secondary | ICD-10-CM | POA: Diagnosis present

## 2017-03-17 DIAGNOSIS — F17210 Nicotine dependence, cigarettes, uncomplicated: Secondary | ICD-10-CM | POA: Diagnosis present

## 2017-03-17 DIAGNOSIS — M792 Neuralgia and neuritis, unspecified: Secondary | ICD-10-CM | POA: Diagnosis present

## 2017-03-17 DIAGNOSIS — J181 Lobar pneumonia, unspecified organism: Principal | ICD-10-CM | POA: Diagnosis present

## 2017-03-17 DIAGNOSIS — I10 Essential (primary) hypertension: Secondary | ICD-10-CM | POA: Diagnosis present

## 2017-03-17 LAB — CBC WITH DIFFERENTIAL/PLATELET
BASOS ABS: 0 10*3/uL (ref 0.0–0.1)
Basophils Relative: 0 %
Eosinophils Absolute: 0.1 10*3/uL (ref 0.0–0.7)
Eosinophils Relative: 1 %
HEMATOCRIT: 42.5 % (ref 36.0–46.0)
HEMOGLOBIN: 13.8 g/dL (ref 12.0–15.0)
LYMPHS PCT: 23 %
Lymphs Abs: 3.3 10*3/uL (ref 0.7–4.0)
MCH: 26.6 pg (ref 26.0–34.0)
MCHC: 32.5 g/dL (ref 30.0–36.0)
MCV: 82 fL (ref 78.0–100.0)
MONO ABS: 0.9 10*3/uL (ref 0.1–1.0)
Monocytes Relative: 6 %
NEUTROS ABS: 10.3 10*3/uL — AB (ref 1.7–7.7)
NEUTROS PCT: 70 %
Platelets: 299 10*3/uL (ref 150–400)
RBC: 5.18 MIL/uL — AB (ref 3.87–5.11)
RDW: 15.5 % (ref 11.5–15.5)
WBC: 14.6 10*3/uL — AB (ref 4.0–10.5)

## 2017-03-17 LAB — COMPREHENSIVE METABOLIC PANEL
ALBUMIN: 3.9 g/dL (ref 3.5–5.0)
ALK PHOS: 82 U/L (ref 38–126)
ALT: 18 U/L (ref 14–54)
AST: 23 U/L (ref 15–41)
Anion gap: 8 (ref 5–15)
BILIRUBIN TOTAL: 0.5 mg/dL (ref 0.3–1.2)
BUN: 6 mg/dL (ref 6–20)
CO2: 29 mmol/L (ref 22–32)
CREATININE: 0.77 mg/dL (ref 0.44–1.00)
Calcium: 9.3 mg/dL (ref 8.9–10.3)
Chloride: 99 mmol/L — ABNORMAL LOW (ref 101–111)
GFR calc Af Amer: >60 mL/min (ref >60)
GFR calc non Af Amer: >60 mL/min (ref >60)
GLUCOSE: 167 mg/dL — AB (ref 65–99)
POTASSIUM: 4.3 mmol/L (ref 3.5–5.1)
Sodium: 136 mmol/L (ref 135–145)
TOTAL PROTEIN: 8 g/dL (ref 6.5–8.1)

## 2017-03-17 LAB — INFLUENZA PANEL BY PCR (TYPE A & B)
Influenza A By PCR: NEGATIVE
Influenza B By PCR: NEGATIVE

## 2017-03-17 LAB — MAGNESIUM: MAGNESIUM: 2 mg/dL (ref 1.7–2.4)

## 2017-03-17 LAB — RAPID STREP SCREEN (MED CTR MEBANE ONLY): Streptococcus, Group A Screen (Direct): NEGATIVE

## 2017-03-17 LAB — I-STAT CG4 LACTIC ACID, ED: Lactic Acid, Venous: 1.53 mmol/L (ref 0.5–1.9)

## 2017-03-17 LAB — I-STAT TROPONIN, ED: Troponin i, poc: 0 ng/mL (ref 0.00–0.08)

## 2017-03-17 MED ORDER — IPRATROPIUM BROMIDE 0.02 % IN SOLN
0.5000 mg | Freq: Once | RESPIRATORY_TRACT | Status: AC
  Administered 2017-03-17: 0.5 mg via RESPIRATORY_TRACT
  Filled 2017-03-17: qty 2.5

## 2017-03-17 MED ORDER — ALBUTEROL SULFATE (2.5 MG/3ML) 0.083% IN NEBU
5.0000 mg | INHALATION_SOLUTION | Freq: Once | RESPIRATORY_TRACT | Status: AC
  Administered 2017-03-17: 5 mg via RESPIRATORY_TRACT
  Filled 2017-03-17: qty 6

## 2017-03-17 MED ORDER — AMLODIPINE BESYLATE 10 MG PO TABS
10.0000 mg | ORAL_TABLET | Freq: Every day | ORAL | Status: DC
  Administered 2017-03-18 – 2017-03-20 (×3): 10 mg via ORAL
  Filled 2017-03-17 (×3): qty 1

## 2017-03-17 MED ORDER — METOPROLOL SUCCINATE ER 100 MG PO TB24
100.0000 mg | ORAL_TABLET | Freq: Every day | ORAL | Status: DC
  Administered 2017-03-18 – 2017-03-20 (×3): 100 mg via ORAL
  Filled 2017-03-17 (×3): qty 1

## 2017-03-17 MED ORDER — LAMOTRIGINE 25 MG PO TABS
25.0000 mg | ORAL_TABLET | Freq: Two times a day (BID) | ORAL | Status: DC
  Administered 2017-03-17 – 2017-03-20 (×6): 25 mg via ORAL
  Filled 2017-03-17 (×6): qty 1

## 2017-03-17 MED ORDER — IPRATROPIUM-ALBUTEROL 0.5-2.5 (3) MG/3ML IN SOLN
3.0000 mL | Freq: Four times a day (QID) | RESPIRATORY_TRACT | Status: DC
  Administered 2017-03-18: 3 mL via RESPIRATORY_TRACT
  Filled 2017-03-17: qty 3

## 2017-03-17 MED ORDER — GABAPENTIN 300 MG PO CAPS
300.0000 mg | ORAL_CAPSULE | Freq: Three times a day (TID) | ORAL | Status: DC
  Administered 2017-03-17 – 2017-03-20 (×9): 300 mg via ORAL
  Filled 2017-03-17 (×9): qty 1

## 2017-03-17 MED ORDER — ENOXAPARIN SODIUM 60 MG/0.6ML ~~LOC~~ SOLN
55.0000 mg | SUBCUTANEOUS | Status: DC
  Administered 2017-03-17 – 2017-03-19 (×3): 55 mg via SUBCUTANEOUS
  Filled 2017-03-17: qty 0.6
  Filled 2017-03-17: qty 0.55
  Filled 2017-03-17 (×2): qty 0.6

## 2017-03-17 MED ORDER — DEXTROSE 5 % IV SOLN
1.0000 g | Freq: Once | INTRAVENOUS | Status: AC
  Administered 2017-03-17: 1 g via INTRAVENOUS
  Filled 2017-03-17: qty 10

## 2017-03-17 MED ORDER — DEXTROSE 5 % IV SOLN
500.0000 mg | INTRAVENOUS | Status: DC
  Filled 2017-03-17: qty 500

## 2017-03-17 MED ORDER — METHYLPREDNISOLONE SODIUM SUCC 125 MG IJ SOLR
125.0000 mg | Freq: Once | INTRAMUSCULAR | Status: AC
  Administered 2017-03-17: 125 mg via INTRAVENOUS
  Filled 2017-03-17: qty 2

## 2017-03-17 MED ORDER — GUAIFENESIN ER 600 MG PO TB12
1200.0000 mg | ORAL_TABLET | Freq: Two times a day (BID) | ORAL | Status: DC
  Administered 2017-03-17 – 2017-03-20 (×6): 1200 mg via ORAL
  Filled 2017-03-17 (×6): qty 2

## 2017-03-17 MED ORDER — PAROXETINE HCL 20 MG PO TABS
30.0000 mg | ORAL_TABLET | Freq: Every morning | ORAL | Status: DC
  Administered 2017-03-18 – 2017-03-20 (×3): 30 mg via ORAL
  Filled 2017-03-17 (×3): qty 1

## 2017-03-17 MED ORDER — ASPIRIN 325 MG PO TABS
325.0000 mg | ORAL_TABLET | Freq: Every day | ORAL | Status: DC
  Administered 2017-03-18 – 2017-03-20 (×3): 325 mg via ORAL
  Filled 2017-03-17 (×3): qty 1

## 2017-03-17 MED ORDER — SODIUM CHLORIDE 0.9 % IV SOLN
INTRAVENOUS | Status: DC
  Administered 2017-03-17 – 2017-03-20 (×5): via INTRAVENOUS

## 2017-03-17 MED ORDER — IPRATROPIUM-ALBUTEROL 0.5-2.5 (3) MG/3ML IN SOLN
3.0000 mL | Freq: Four times a day (QID) | RESPIRATORY_TRACT | Status: DC
  Administered 2017-03-17: 3 mL via RESPIRATORY_TRACT
  Filled 2017-03-17: qty 3

## 2017-03-17 MED ORDER — LITHIUM CARBONATE 300 MG PO CAPS
300.0000 mg | ORAL_CAPSULE | ORAL | Status: DC
  Administered 2017-03-18 – 2017-03-20 (×3): 300 mg via ORAL
  Filled 2017-03-17 (×3): qty 1

## 2017-03-17 MED ORDER — ACETAMINOPHEN 325 MG PO TABS
650.0000 mg | ORAL_TABLET | Freq: Once | ORAL | Status: AC | PRN
  Administered 2017-03-17: 650 mg via ORAL
  Filled 2017-03-17: qty 2

## 2017-03-17 MED ORDER — LITHIUM CARBONATE 300 MG PO CAPS
600.0000 mg | ORAL_CAPSULE | Freq: Every day | ORAL | Status: DC
  Administered 2017-03-17 – 2017-03-19 (×3): 600 mg via ORAL
  Filled 2017-03-17 (×3): qty 2

## 2017-03-17 MED ORDER — SIMVASTATIN 20 MG PO TABS
20.0000 mg | ORAL_TABLET | Freq: Every day | ORAL | Status: DC
  Administered 2017-03-17 – 2017-03-19 (×3): 20 mg via ORAL
  Filled 2017-03-17 (×5): qty 1

## 2017-03-17 MED ORDER — DEXTROSE 5 % IV SOLN
1.0000 g | INTRAVENOUS | Status: DC
  Filled 2017-03-17: qty 10

## 2017-03-17 MED ORDER — DEXTROSE 5 % IV SOLN
500.0000 mg | Freq: Once | INTRAVENOUS | Status: AC
  Administered 2017-03-17: 500 mg via INTRAVENOUS
  Filled 2017-03-17: qty 500

## 2017-03-17 MED ORDER — DARIFENACIN HYDROBROMIDE ER 7.5 MG PO TB24
7.5000 mg | ORAL_TABLET | Freq: Every day | ORAL | Status: DC
  Administered 2017-03-18 – 2017-03-20 (×3): 7.5 mg via ORAL
  Filled 2017-03-17 (×3): qty 1

## 2017-03-17 MED ORDER — FLUTICASONE FUROATE-VILANTEROL 100-25 MCG/INH IN AEPB
1.0000 | INHALATION_SPRAY | Freq: Every day | RESPIRATORY_TRACT | Status: DC
  Administered 2017-03-18 – 2017-03-20 (×3): 1 via RESPIRATORY_TRACT
  Filled 2017-03-17: qty 28

## 2017-03-17 MED ORDER — PANTOPRAZOLE SODIUM 40 MG PO TBEC
40.0000 mg | DELAYED_RELEASE_TABLET | Freq: Two times a day (BID) | ORAL | Status: DC
  Administered 2017-03-17 – 2017-03-20 (×6): 40 mg via ORAL
  Filled 2017-03-17 (×6): qty 1

## 2017-03-17 NOTE — ED Notes (Signed)
Bed: WTR6 Expected date:  Expected time:  Means of arrival:  Comments: 

## 2017-03-17 NOTE — ED Notes (Signed)
ED TO INPATIENT HANDOFF REPORT  Name/Age/Gender Kathleen Herrera 57 y.o. female  Code Status    Code Status Orders  (From admission, onward)        Start     Ordered   03/17/17 1551  Full code  Continuous     03/17/17 1551    Code Status History    Date Active Date Inactive Code Status Order ID Comments User Context   02/25/2017 15:29 03/01/2017 16:42 Full Code [id number]  Rozetta Nunnery, NP Inpatient   02/24/2017 23:07 02/25/2017 15:04 Full Code [id number]  Volanda Napoleon, PA-C ED   11/01/2016 03:56 11/03/2016 21:05 Full Code [id number]  Rise Patience, MD ED   09/19/2015 23:08 09/23/2015 16:37 DNR [id number]  Toy Baker, MD Inpatient      Home/SNF/Other  Chief Complaint Flu-like symptoms  Level of Care/Admitting Diagnosis ED Disposition    ED Disposition Condition [facility] Area: [facility] [[id number]]  Level of Care: Med-Surg [16]  Diagnosis: CAP (community acquired pneumonia) [[id number]]  Admitting Physician: Marene Lenz [[id number]]  Attending Physician: Jodie Echevaria, AMRIT [[id number]]  Estimated length of stay: past midnight tomorrow  Certification:: I certify this patient will need inpatient services for at least 2 midnights  PT Class (Do Not Modify): Inpatient [101]  PT Acc Code (Do Not Modify): Private [1]       Medical History Past Medical History:  Diagnosis Date  . Arthritis   . Asthma   . 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 Bipolar disorder ([place])   . Bronchitis   . Depression   . Fibromyalgia Y-2  . Fibromyalgia unknown  . Hyperlipemia   . Hypertension   . Post traumatic stress disorder   . Sinus congestion     Allergies No Known Allergies  IV Location/Drains/Wounds Patient Lines/Drains/Airways Status   Active Line/Drains/Airways    Name:   Placement date:   Placement time:   Site:   Days:   Peripheral IV 03/17/17 Right Forearm   03/17/17    1319    Forearm   less than 1          Labs/Imaging Results  for orders placed or performed during the hospital encounter of 03/17/17 (from the past 48 hour(s))  Rapid strep screen     Status: None   Collection Time: 03/17/17 11:32 AM  Result Value Ref Range   Streptococcus, Group A Screen (Direct) NEGATIVE NEGATIVE    Comment: (NOTE) A Rapid Antigen test may result negative if the antigen level in the sample is below the detection level of this test. The FDA has not cleared this test as a stand-alone test therefore the rapid antigen negative result has reflexed to a Group A Strep culture. Performed at [facility] [place] [address] [place],  [address]   CBC with Differential     Status: Abnormal   Collection Time: 03/17/17 12:48 PM  Result Value Ref Range   WBC 14.6 (H) 4.0 - 10.5 K/uL   RBC 5.18 (H) 3.87 - 5.11 MIL/uL   Hemoglobin 13.8 12.0 - 15.0 g/dL   HCT 42.5 36.0 - 46.0 %   MCV 82.0 78.0 - 100.0 fL   MCH 26.6 26.0 - 34.0 pg   MCHC 32.5 30.0 - 36.0 g/dL   RDW 15.5 11.5 - 15.5 %   Platelets 299 150 - 400 K/uL   Neutrophils Relative % 70 %   Neutro Abs 10.3 (H) 1.7 - 7.7 K/uL  Lymphocytes Relative 23 %   Lymphs Abs 3.3 0.7 - 4.0 K/uL   Monocytes Relative 6 %   Monocytes Absolute 0.9 0.1 - 1.0 K/uL   Eosinophils Relative 1 %   Eosinophils Absolute 0.1 0.0 - 0.7 K/uL   Basophils Relative 0 %   Basophils Absolute 0.0 0.0 - 0.1 K/uL    Comment: Performed at [facility], [place] [address], [place], [place] [address]  Comprehensive metabolic panel     Status: Abnormal   Collection Time: 03/17/17 12:48 PM  Result Value Ref Range   Sodium 136 135 - 145 mmol/L   Potassium 4.3 3.5 - 5.1 mmol/L   Chloride 99 (L) 101 - 111 mmol/L   CO2 29 22 - 32 mmol/L   Glucose, Bld 167 (H) 65 - 99 mg/dL   BUN 6 6 - 20 mg/dL   Creatinine, Ser 0.77 0.44 - 1.00 mg/dL   Calcium 9.3 8.9 - 10.3 mg/dL   Total Protein 8.0 6.5 - 8.1 g/dL   Albumin 3.9 3.5 - 5.0 g/dL   AST 23 15 - 41 U/L   ALT 18 14 - 54 U/L    Alkaline Phosphatase 82 38 - 126 U/L   Total Bilirubin 0.5 0.3 - 1.2 mg/dL   GFR calc non Af Amer >60 >60 mL/min   GFR calc Af Amer >60 >60 mL/min    Comment: (NOTE) The eGFR has been calculated using the CKD EPI equation. This calculation has not been validated in all clinical situations. eGFR's persistently <60 mL/min signify possible Chronic Kidney Disease.    Anion gap 8 5 - 15    Comment: Performed at [facility], [place] [address], [place], [place] [address]  Magnesium     Status: None   Collection Time: 03/17/17 12:48 PM  Result Value Ref Range   Magnesium 2.0 1.7 - 2.4 mg/dL    Comment: Performed at [facility], [place] [address] [place], [place] [address]  I-Stat CG4 Lactic Acid, ED     Status: None   Collection Time: 03/17/17  1:02 PM  Result Value Ref Range   Lactic Acid, Venous 1.53 0.5 - 1.9 mmol/L  Influenza panel by PCR (type A & B)     Status: None   Collection Time: 03/17/17  1:12 PM  Result Value Ref Range   Influenza A By PCR NEGATIVE NEGATIVE   Influenza B By PCR NEGATIVE NEGATIVE    Comment: (NOTE) The Xpert Xpress Flu assay is intended as an aid in the diagnosis of  influenza and should not be used as a sole basis for treatment.  This  assay is FDA approved for nasopharyngeal swab specimens only. Nasal  washings and aspirates are unacceptable for Xpert Xpress Flu testing. Performed at [facility], [place] [address] [place], [place] [address]   I-Stat Troponin, ED (not at [facility])     Status: None   Collection Time: 03/17/17  1:13 PM  Result Value Ref Range   Troponin i, poc 0.00 0.00 - 0.08 ng/mL   Comment 3            Comment: Due to the release kinetics of cTnI, a negative result within the first hours of the onset of symptoms does not rule out myocardial infarction with certainty. If myocardial infarction is still suspected, repeat the test at appropriate intervals.    Dg Chest 2 View  Result  Date: 03/17/2017 CLINICAL DATA:  Cough, wheezing, chills, shortness  of Breath EXAM: CHEST  2 VIEW COMPARISON:  02/24/2017 FINDINGS: Airspace opacity noted in the right upper lobe compatible with pneumonia. Left lung clear. Heart is upper limits normal in size. No effusions or acute bony abnormality. IMPRESSION: Right upper lobe pneumonia. Electronically Signed   By: Rolm Baptise M.D.   On: 03/17/2017 11:51    Pending Labs Unresulted Labs (From admission, onward)   Start     Ordered   03/24/17 0500  Creatinine, serum  (enoxaparin (LOVENOX)    CrCl >/= 30 ml/min)  Weekly,   R    Comments:  while on enoxaparin therapy    03/17/17 1551   03/18/17 5625  Basic metabolic panel  Tomorrow morning,   R     03/17/17 1551   03/18/17 0500  Comprehensive metabolic panel  Tomorrow morning,   R     03/17/17 1551   03/17/17 1544  Respiratory Panel by PCR  (Respiratory virus panel)  Once,   R     03/17/17 1544   03/17/17 1544  Culture, expectorated sputum-assessment  Once,   R     03/17/17 1544   03/17/17 1241  Blood culture (routine x 2)  BLOOD CULTURE X 2,   STAT     03/17/17 1240   03/17/17 1132  Culture, group A strep  Once,   STAT     03/17/17 1132      Vitals/Pain Today's Vitals   03/17/17 1530 03/17/17 1600 03/17/17 1601 03/17/17 1620  BP: (!) 117/102 135/67    Pulse: 70 72 69 69  Resp: 14 (!) _0 Temp:      TempSrc:      SpO2: 93% (!) 89% 91% 92%  Weight:      PainSc:        Isolation Precautions No active isolations  Medications Medications  azithromycin (ZITHROMAX) 500 mg in dextrose 5 % 250 mL IVPB (not administered)  cefTRIAXone (ROCEPHIN) 1 g in dextrose 5 % 50 mL IVPB (not administered)  ipratropium-albuterol (DUONEB) 0.5-2.5 (3) MG/3ML nebulizer solution 3 mL (not administered)  amLODipine (NORVASC) tablet 10 mg (not administered)  aspirin tablet 325 mg (not administered)  fluticasone furoate-vilanterol (BREO ELLIPTA) 100-25 MCG/INH 1 puff (not administered)   gabapentin (NEURONTIN) capsule 300 mg (not administered)  lamoTRIgine (LAMICTAL) tablet 25 mg (not administered)  lithium carbonate capsule 300 mg (not administered)  lithium carbonate capsule 600 mg (not administered)  metoprolol succinate (TOPROL-XL) 24 hr tablet 100 mg (not administered)  pantoprazole (PROTONIX) EC tablet 40 mg (not administered)  PARoxetine (PAXIL) tablet 30 mg (not administered)  simvastatin (ZOCOR) tablet 20 mg (not administered)  darifenacin (ENABLEX) 24 hr tablet 7.5 mg (not administered)  enoxaparin (LOVENOX) injection 55 mg (not administered)  0.9 %  sodium chloride infusion (not administered)  guaiFENesin (MUCINEX) 12 hr tablet 1,200 mg (not administered)  acetaminophen (TYLENOL) tablet 650 mg (650 mg Oral Given 03/17/17 1136)  albuterol (PROVENTIL) (2.5 MG/3ML) 0.083% nebulizer solution 5 mg (5 mg Nebulization Given 03/17/17 1137)  ipratropium (ATROVENT) nebulizer solution 0.5 mg (0.5 mg Nebulization Given 03/17/17 1408)  albuterol (PROVENTIL) (2.5 MG/3ML) 0.083% nebulizer solution 5 mg (5 mg Nebulization Given 03/17/17 1408)  methylPREDNISolone sodium succinate (SOLU-MEDROL) 125 mg/2 mL injection 125 mg (125 mg Intravenous Given 03/17/17 1313)  cefTRIAXone (ROCEPHIN) 1 g in dextrose 5 % 50 mL IVPB (0 g Intravenous Stopped 03/17/17 1355)  azithromycin (ZITHROMAX) 500 mg in dextrose 5 % 250 mL IVPB (0 mg Intravenous Stopped 03/17/17 1613)  Mobility

## 2017-03-17 NOTE — Progress Notes (Signed)
PHARMACY - ENOXAPARIN  Pharmacy consulted to adjust Enoxaparin dose as needed for VTE prophylaxis.    Height: 62.75 inches Weight: 113.4kg BMI = 44 SCr = 93.6 ml/min  Assessment: BMI > 30   BMI > 30 and CrCl > 30 ml/min, therefore will adjust enoxparin dose per manufacturer recommendations to 0.5mg /kg/q24h  PLAN: Enoxaparin 55mg  sq q24h  Terrilee FilesLeann Abbie Jablon, PharmD

## 2017-03-17 NOTE — ED Notes (Signed)
Resp called regarding breathing treatments

## 2017-03-17 NOTE — ED Provider Notes (Signed)
[facility]-EMERGENCY DEPT Provider Note   CSN: [id number] Arrival date & time: 03/17/17  1104     History   Chief Complaint Chief Complaint  Patient presents with  . Influenza  . Cough    HPI Kathleen Herrera is a 57 y.o. female with a h/o of asthma, HTN, HLD who presents to the emergency department with a chief complaint of dyspnea that began 2-3 days ago, constant since onset. Worse with exertion and alleviated by nothing.  She is treated her symptoms with her home Symbicort with no improvement.  She can not find her albuterol inhaler at home.  She also endorses chills, subjective fever, myalgias, sneezing, anorexia, sore throat, nasal congestion that began 5-6 days ago and a productive cough with green and dark brown sputum and chest tightness and pain that began 3 days ago.  She denies nausea, vomiting, diarrhea, abdominal pain, palpitations, lower extremity swelling, dizziness, lightheadedness, or syncope.  She has treated her symptoms with 1 bottle of Mucinex since onset without improvement.  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  She reports that the chest pain is constant, sharp, and worse after coughing that radiates from to her back bilaterally.  EMS reports that she was febrile to 102.9 and refused Tylenol in route.   She currently smokes 3 cigarettes/day.  No history of DM or HIV.  The history is provided by the patient. No language interpreter was used.    Past Medical History:  Diagnosis Date  . Arthritis   . Asthma   . 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 Bipolar disorder (HCC)   . Bronchitis   . Depression   . Fibromyalgia Y-2  . Fibromyalgia unknown  . Hyperlipemia   . Hypertension   . Post traumatic stress disorder   . Sinus congestion     Patient Active Problem List   Diagnosis Date Noted  . Bipolar 1 disorder, depressed, severe (HCC) 02/25/2017  . 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 Episodic recurrent vertigo   . Dermatitis   . Asthma     . CAP (community acquired pneumonia) 09/19/2015  . Dyspnea 09/19/2015  . Acute respiratory failure with hypoxia (HCC) 09/19/2015  . Abnormality of gait 06/29/2014  . Essential hypertension 06/29/2014  . Hyperlipidemia 06/29/2014  . Metabolic syndrome 06/29/2014  . Neuropathic pain 06/29/2014  . Gastroesophageal reflux disease without esophagitis 06/29/2014  . Tachycardia 06/29/2014  . At risk for osteopenia 06/29/2014  . Excessive daytime sleepiness 06/29/2014  . Fibromyalgia   . Abnormal LFTs 03/06/2013  . Extreme obesity 12/18/2012  . Fatty infiltration of liver 07/15/2012  . Gonalgia 07/15/2012  . LBP (low back pain) 07/15/2012  . Arthritis, degenerative 06/12/2012  . Arthritis 05/26/2012  . Bipolar 2 disorder (HCC) 04/17/2012  . Clinical depression 04/17/2012    Past Surgical History:  Procedure Laterality Date  . ABDOMINAL HYSTERECTOMY      OB History    No data available       Home Medications    Prior to Admission medications   Medication Sig Start Date End Date Taking? Authorizing Provider  albuterol (PROVENTIL HFA;VENTOLIN HFA) 108 (90 Base) MCG/ACT inhaler Inhale 2 puffs into the lungs every 6 (six) hours as needed for wheezing or shortness of breath. 09/23/15  Yes Dorothea Ogle, MD  amLODipine (NORVASC) 10 MG tablet Take 1 tablet (10 mg total) by mouth daily. 10/16/16  Yes Bing Neighbors, FNP  aspirin 325 MG tablet Take 1 tablet (325 mg total) by mouth daily.  11/20/16  Yes Bing NeighborsHarris, Kimberly S, FNP  beta carotene 30 MG capsule Take 30 mg by mouth daily.   Yes [provider]  betamethasone dipropionate (DIPROLENE) 0.05 % ointment Apply topically 2 (two) times daily. Patient taking differently: Apply 1 application topically 2 (two) times daily as needed (rash).  12/05/16  Yes Bing NeighborsHarris, Kimberly S, FNP  budesonide-formoterol (SYMBICORT) 80-4.5 MCG/ACT inhaler Inhale 2 puffs into the lungs 2 (two) times daily. 11/20/16  Yes Bing NeighborsHarris, Kimberly S, FNP   furosemide (LASIX) 20 MG tablet Take 1 tablet (20 mg total) by mouth daily. 11/20/16  Yes Bing NeighborsHarris, Kimberly S, FNP  gabapentin (NEURONTIN) 300 MG capsule TAKE 1 CAPSULE (300 MG TOTAL) BY MOUTH THREE TIMES DAILY. 01/25/17  Yes Bing NeighborsHarris, Kimberly S, FNP  hydrOXYzine (ATARAX/VISTARIL) 25 MG tablet Take 1 tablet (25 mg total) by mouth 3 (three) times daily as needed for itching or anxiety. 03/01/17  Yes Money, Gerlene Burdockravis B, FNP  lamoTRIgine (LAMICTAL) 25 MG tablet Take 1 tablet (25 mg total) by mouth 2 (two) times daily. For mood control Patient taking differently: Take 25 mg by mouth 3 (three) times daily. For mood control 03/01/17  Yes Money, Gerlene Burdockravis B, FNP  lithium carbonate 300 MG capsule Take 1 capsule (300 mg total) by mouth every morning. For mood control 03/02/17  Yes Money, Gerlene Burdockravis B, FNP  lithium carbonate 600 MG capsule Take 1 capsule (600 mg total) by mouth at bedtime. For mood control 03/01/17  Yes Money, Gerlene Burdockravis B, FNP  meclizine (ANTIVERT) 12.5 MG tablet TAKE 1 TABLET (12.5 MG TOTAL) BY MOUTH TWO TIMES DAILY AS NEEDED FOR DIZZINESS. Patient taking differently: Take 12.5 mg by mouth every morning. TAKE 1 TABLET (12.5 MG TOTAL) BY MOUTH TWO TIMES DAILY AS NEEDED FOR DIZZINESS. 01/10/17  Yes Bing NeighborsHarris, Kimberly S, FNP  metoprolol succinate (TOPROL-XL) 50 MG 24 hr tablet Take 2 tablets (100 mg total) by mouth daily. Take with or immediately following a meal. 10/16/16  Yes Bing NeighborsHarris, Kimberly S, FNP  nystatin (MYCOSTATIN/NYSTOP) powder APPLY TWO SPRINKLES OF POWDER TO THE AFFECTED INNER THIGHS TO PREVENT ITCHING AND FUNGAL RASH 11/12/16  Yes Bing NeighborsHarris, Kimberly S, FNP  pantoprazole (PROTONIX) 40 MG tablet Take 1 tablet (40 mg total) by mouth 2 (two) times daily before a meal. 10/16/16  Yes Bing NeighborsHarris, Kimberly S, FNP  PARoxetine (PAXIL) 30 MG tablet Take 30 mg by mouth every morning.   Yes [provider]  simvastatin (ZOCOR) 20 MG tablet TAKE ONE TABLET   (20 MG TOTAL ) BY MOUTH   AT BEDTIME 11/20/16  Yes Bing NeighborsHarris,  Kimberly S, FNP  solifenacin (VESICARE) 5 MG tablet Take 1 tablet (5 mg total) by mouth daily. 11/20/16 11/20/17 Yes Bing NeighborsHarris, Kimberly S, FNP    Family History Family History  Problem Relation Age of Onset  . Dementia Mother   . Prostate cancer Father   . Dementia Father   . Prostate cancer Other   . CAD Other     Social History Social History   Tobacco Use  . Smoking status: Current Every Day Smoker    Packs/day: 0.25    Years: 37.00    Pack years: 9.25    Types: Cigarettes  . Smokeless tobacco: Never Used  Substance Use Topics  . Alcohol use: Yes    Comment: occasionally  . Drug use: Yes    Types: "Crack" cocaine    Comment: crack     Allergies   Patient has no known allergies.   Review of Systems Review  of Systems  Constitutional: Positive for chills and fever. Negative for activity change.  HENT: Positive for congestion, sneezing and sore throat. Negative for ear pain, facial swelling, tinnitus and trouble swallowing.   Eyes: Negative for visual disturbance.  Respiratory: Positive for cough, chest tightness, shortness of breath and wheezing. Negative for stridor.   Cardiovascular: Positive for chest pain. Negative for palpitations and leg swelling.  Gastrointestinal: Negative for abdominal pain, diarrhea, nausea and vomiting.  Genitourinary: Negative for dysuria.  Musculoskeletal: Positive for myalgias. Negative for back pain, neck pain and neck stiffness.  Skin: Negative for rash.  Allergic/Immunologic: Negative for immunocompromised state.  Neurological: Negative for weakness and headaches.  Psychiatric/Behavioral: Negative for confusion.     Physical Exam Updated Vital Signs BP 99/61   Pulse 69   Temp (!) 100.9 F (38.3 C) (Oral)   Resp 16   Wt 113.4 kg (250 lb)   SpO2 91%   BMI 44.64 kg/m   Physical Exam  Constitutional: No distress.  Uncomfortable appearing.  Nontoxic-appearing  HENT:  Head: Normocephalic and atraumatic.  Right Ear:  Hearing and ear canal normal. A middle ear effusion is present.  Left Ear: Hearing and ear canal normal. A middle ear effusion is present.  Nose: Mucosal edema present. Right sinus exhibits no maxillary sinus tenderness and no frontal sinus tenderness. Left sinus exhibits no maxillary sinus tenderness and no frontal sinus tenderness.  Mouth/Throat: Uvula is midline and mucous membranes are normal. No trismus in the jaw. Posterior oropharyngeal erythema present. No oropharyngeal exudate, posterior oropharyngeal edema or tonsillar abscesses. No tonsillar exudate.  Eyes: Conjunctivae are normal.  Neck: Normal range of motion. Neck supple.  No meningeal signs.  Cardiovascular: Normal rate and regular rhythm. Exam reveals no gallop and no friction rub.  No murmur heard. Pulmonary/Chest: Effort normal. No stridor. No respiratory distress. She has wheezes. She has no rales. She exhibits no tenderness.  Decreased air movement, which is symmetric, bilaterally.  Faint, scattered is between expiratory wheezes heard bilaterally.  Abdominal: Soft. She exhibits no distension.  Neurological: She is alert.  Skin: Skin is warm. Capillary refill takes less than 2 seconds. No rash noted.  Psychiatric: Her behavior is normal.  Nursing note and vitals reviewed.    ED Treatments / Results  Labs (all labs ordered are listed, but only abnormal results are displayed) Labs Reviewed  CBC WITH DIFFERENTIAL/PLATELET - Abnormal; Notable for the following components:      Result Value   WBC 14.6 (*)    RBC 5.18 (*)    Neutro Abs 10.3 (*)    All other components within normal limits  COMPREHENSIVE METABOLIC PANEL - Abnormal; Notable for the following components:   Chloride 99 (*)    Glucose, Bld 167 (*)    All other components within normal limits  RAPID STREP SCREEN (NOT AT [facility])  CULTURE, GROUP A STREP ([facility])  CULTURE, BLOOD (ROUTINE X 2)  CULTURE, BLOOD (ROUTINE X 2)  RESPIRATORY PANEL BY PCR  CULTURE,  EXPECTORATED SPUTUM-ASSESSMENT  INFLUENZA PANEL BY PCR (TYPE A & B)  MAGNESIUM  I-STAT CG4 LACTIC ACID, ED  I-STAT TROPONIN, ED    EKG  EKG Interpretation None       Radiology Dg Chest 2 View  Result Date: 03/17/2017 CLINICAL DATA:  Cough, wheezing, chills, shortness of Breath EXAM: CHEST  2 VIEW COMPARISON:  02/24/2017 FINDINGS: Airspace opacity noted in the right upper lobe compatible with pneumonia. Left lung clear. Heart is upper limits normal in size.  No effusions or acute bony abnormality. IMPRESSION: Right upper lobe pneumonia. Electronically Signed   By: Charlett Nose M.D.   On: 03/17/2017 11:51    Procedures .Critical Care Performed by: Barkley Boards, PA-C Authorized by: Barkley Boards, PA-C   Critical care provider statement:    Critical care time (minutes):  35   Critical care was necessary to treat or prevent imminent or life-threatening deterioration of the following conditions:  Sepsis   Critical care was time spent personally by me on the following activities:  Ordering and performing treatments and interventions, ordering and review of laboratory studies, ordering and review of radiographic studies, pulse oximetry, re-evaluation of patient's condition, obtaining history from patient or surrogate, examination of patient, evaluation of patient's response to treatment, discussions with consultants and development of treatment plan with patient or surrogate   I assumed direction of critical care for this patient from another provider in my specialty: no     (including critical care time)  Medications Ordered in ED Medications  azithromycin (ZITHROMAX) 500 mg in dextrose 5 % 250 mL IVPB (not administered)  cefTRIAXone (ROCEPHIN) 1 g in dextrose 5 % 50 mL IVPB (not administered)  ipratropium-albuterol (DUONEB) 0.5-2.5 (3) MG/3ML nebulizer solution 3 mL (not administered)  amLODipine (NORVASC) tablet 10 mg (not administered)  aspirin tablet 325 mg (not administered)    fluticasone furoate-vilanterol (BREO ELLIPTA) 100-25 MCG/INH 1 puff (not administered)  gabapentin (NEURONTIN) capsule 300 mg (not administered)  lamoTRIgine (LAMICTAL) tablet 25 mg (not administered)  lithium carbonate capsule 300 mg (not administered)  lithium carbonate capsule 600 mg (not administered)  metoprolol succinate (TOPROL-XL) 24 hr tablet 100 mg (not administered)  pantoprazole (PROTONIX) EC tablet 40 mg (not administered)  PARoxetine (PAXIL) tablet 30 mg (not administered)  simvastatin (ZOCOR) tablet 20 mg (not administered)  darifenacin (ENABLEX) 24 hr tablet 7.5 mg (not administered)  enoxaparin (LOVENOX) injection 55 mg (not administered)  0.9 %  sodium chloride infusion (not administered)  guaiFENesin (MUCINEX) 12 hr tablet 1,200 mg (not administered)  acetaminophen (TYLENOL) tablet 650 mg (650 mg Oral Given 03/17/17 1136)  albuterol (PROVENTIL) (2.5 MG/3ML) 0.083% nebulizer solution 5 mg (5 mg Nebulization Given 03/17/17 1137)  ipratropium (ATROVENT) nebulizer solution 0.5 mg (0.5 mg Nebulization Given 03/17/17 1408)  albuterol (PROVENTIL) (2.5 MG/3ML) 0.083% nebulizer solution 5 mg (5 mg Nebulization Given 03/17/17 1408)  methylPREDNISolone sodium succinate (SOLU-MEDROL) 125 mg/2 mL injection 125 mg (125 mg Intravenous Given 03/17/17 1313)  cefTRIAXone (ROCEPHIN) 1 g in dextrose 5 % 50 mL IVPB (0 g Intravenous Stopped 03/17/17 1355)  azithromycin (ZITHROMAX) 500 mg in dextrose 5 % 250 mL IVPB (0 mg Intravenous Stopped 03/17/17 1613)     Initial Impression / Assessment and Plan / ED Course  I have reviewed the triage vital signs and the nursing notes.  Pertinent labs & imaging results that were available during my care of the patient were reviewed by me and considered in my medical decision making (see chart for details).  Clinical Course as of Mar 17 1614  Sun Mar 17, 2017  1240 Patient recheck after first nebulizer treatment.  Decreased air movement bilaterally with mild  scattered inspiratory and expiratory wheezes.  SaO2 90% with good waveform on the monitor on room air.  She was titrated up to 3 L to obtain an SaO2 of 93% with good waveform on the monitor.  The patient was discussed with Dr. Estell Harpin, attending physician.  [MM]    Clinical Course User Index [MM]  McDonald, Coral Else, PA-C    57 year old female with a history of asthma, HTN, HLD presenting with dyspnea, productive cough with green and brown sputum, chest pain and tightness, and flulike symptoms over the past 5-6 days.  After first nebulizer treatment, patient still has decreased air movement bilaterally on exam with faint wheezes and she is satting on room air with good waveform at 90% so she was placed on 3 L [place].   She was afebrile to 102.9 with EMS, improving to 100.9 after Tylenol administration in the ED. no tachycardia, but the patient is also taking beta-blockers. Leukocytosis of 14.6 with neutrophil count of 10.3. CXR with RUL pneumonia.   She was given 125 mg of IM Solu-Medrol and started on IV azithromycin and ceftriaxone for CAP.  Repeated nebulizer treatment with ipratropium and albuterol.  On re-examination, wheezing has resolved.  Patient's SaO2 remains at 92% on RA at rest and 89% on RA with ambulation. She was placed back on 3L [place] with SaO2 of 94%.  Influenza panel is negative.  Blood cultures were ordered. Lactate 1.58. Prior to initiation of antibiotics.  EKG with no acute changes from previous.  The patient was discussed and evaluated with Dr. Estell Harpin, attending physician who is agreeable with the workup and plan.  Spoke with Dr. Renford Dills with the hospitalist team who will admit for further workup and evaluation. The patient appears reasonably stabilized for admission considering the current resources, flow, and capabilities available in the ED at this time, and I doubt any other [facility] requiring further screening and/or treatment in the ED prior to admission.  Final Clinical Impressions(s) / ED  Diagnoses   Final diagnoses:  Community acquired pneumonia of right upper lobe of lung (HCC)  Sepsis, due to unspecified organism [facility])    ED Discharge Orders    None       Barkley Boards, PA-C 03/17/17 1616    Bethann Berkshire, MD 03/21/17 1659

## 2017-03-17 NOTE — ED Triage Notes (Signed)
Pt has flu like symptoms fever 102.9 refused Tylenol with EMS. [facility] distress noted by [facility]s or on arrival.

## 2017-03-17 NOTE — ED Triage Notes (Signed)
Pt c/o shortness of breath, cough and rib pain. Audible inspiratory wheezing noted in posterior lobes. Pt reports 5 days hx of productive cough. C/o coughing up thick, brown tinged mucus. Stated that she had nausea three days ago, denies nausea today. Cough is moist. No difficulty speaking. Refused liquid Tylenol as offered by EMS due to GERD.

## 2017-03-17 NOTE — H&P (Addendum)
History and Physical    Kathleen Herrera:[id number] DOB: [birth year] DOA: 03/17/2017  PCP: Bing Neighbors, FNP   Patient coming from: Home    Chief Complaint: Shortness of breath, chest pain ,cough  HPI: Kathleen Herrera is a 57 y.o. female with medical history significant of asthma, hypertension, hyperlipidemia, bipolar type II, PTSD, anxiety, smoker who presents to the emergency department from home with complaints of shortness of breath that started about 2-3 days ago.  Patient was apparently all right before that.  She started having cold symptoms few days ago.  Patient also reported of having body aches and chills.  Patient started taking Mucinex to treat for her regular cold but it did not get anything better.  Since yesterday she was also having chest pain when she coughs and she was bringing up dark brown greenish/ phlegm. Patient lives with his friend.  She denies any  sick contacts. Patient smokes 3-4 cigarettes a day.  Currently she does not work and is on disability. Patient also has history of drug abuse.  She says currently she is on a rehabilitation program. Her UDS was positive for opiates and cocaine.  She said the last time she took cocaine was about a month ago.  She says she smokes cocaine and denies any IV drug abuse. She has extensive psychiatric history and follows with psychiatry as an outpatient. Patient was found to be desaturating on presentation.  She was put on supplemental oxygen. Patient seen and examined the bedside in the emergency department.  Currently she looks more comfortable.  She says she is feeling better.  Her vitals were stable during my evaluation and she was saturating 95% on room air.    ED Course: Chest x-ray done in the emergency department was positive of right upper lobe pneumonia.  She was treated with a dose of ceftriaxone and azithromycin.  Patient was given nebulizer treatment and was also given a dose of Solu-Medrol 125 mg.  Review  of Systems: As per HPI otherwise 10 point review of systems negative.    Past Medical History:  Diagnosis Date  . Arthritis   . Asthma   . 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 Bipolar disorder (HCC)   . Bronchitis   . Depression   . Fibromyalgia Y-2  . Fibromyalgia unknown  . Hyperlipemia   . Hypertension   . Post traumatic stress disorder   . Sinus congestion     Past Surgical History:  Procedure Laterality Date  . ABDOMINAL HYSTERECTOMY       reports that she has been smoking cigarettes.  She has a 9.25 pack-year smoking history. she has never used smokeless tobacco. She reports that she drinks alcohol. She reports that she uses drugs. Drug: "Crack" cocaine.  No Known Allergies  Family History  Problem Relation Age of Onset  . Dementia Mother   . Prostate cancer Father   . Dementia Father   . Prostate cancer Other   . CAD Other      Prior to Admission medications   Medication Sig Start Date End Date Taking? Authorizing Provider  albuterol (PROVENTIL HFA;VENTOLIN HFA) 108 (90 Base) MCG/ACT inhaler Inhale 2 puffs into the lungs every 6 (six) hours as needed for wheezing or shortness of breath. 09/23/15  Yes Dorothea Ogle, MD  amLODipine (NORVASC) 10 MG tablet Take 1 tablet (10 mg total) by mouth daily. 10/16/16  Yes Bing Neighbors, FNP  aspirin 325 MG tablet Take 1 tablet (325 mg total) by  mouth daily. 11/20/16  Yes Bing NeighborsHarris, Kimberly S, FNP  beta carotene 30 MG capsule Take 30 mg by mouth daily.   Yes [provider]  betamethasone dipropionate (DIPROLENE) 0.05 % ointment Apply topically 2 (two) times daily. Patient taking differently: Apply 1 application topically 2 (two) times daily as needed (rash).  12/05/16  Yes Bing NeighborsHarris, Kimberly S, FNP  budesonide-formoterol (SYMBICORT) 80-4.5 MCG/ACT inhaler Inhale 2 puffs into the lungs 2 (two) times daily. 11/20/16  Yes Bing NeighborsHarris, Kimberly S, FNP  furosemide (LASIX) 20 MG tablet Take 1 tablet (20 mg total) by mouth daily. 11/20/16  Yes  Bing NeighborsHarris, Kimberly S, FNP  gabapentin (NEURONTIN) 300 MG capsule TAKE 1 CAPSULE (300 MG TOTAL) BY MOUTH THREE TIMES DAILY. 01/25/17  Yes Bing NeighborsHarris, Kimberly S, FNP  hydrOXYzine (ATARAX/VISTARIL) 25 MG tablet Take 1 tablet (25 mg total) by mouth 3 (three) times daily as needed for itching or anxiety. 03/01/17  Yes Money, Gerlene Burdockravis B, FNP  lamoTRIgine (LAMICTAL) 25 MG tablet Take 1 tablet (25 mg total) by mouth 2 (two) times daily. For mood control Patient taking differently: Take 25 mg by mouth 3 (three) times daily. For mood control 03/01/17  Yes Money, Gerlene Burdockravis B, FNP  lithium carbonate 300 MG capsule Take 1 capsule (300 mg total) by mouth every morning. For mood control 03/02/17  Yes Money, Gerlene Burdockravis B, FNP  lithium carbonate 600 MG capsule Take 1 capsule (600 mg total) by mouth at bedtime. For mood control 03/01/17  Yes Money, Gerlene Burdockravis B, FNP  meclizine (ANTIVERT) 12.5 MG tablet TAKE 1 TABLET (12.5 MG TOTAL) BY MOUTH TWO TIMES DAILY AS NEEDED FOR DIZZINESS. Patient taking differently: Take 12.5 mg by mouth every morning. TAKE 1 TABLET (12.5 MG TOTAL) BY MOUTH TWO TIMES DAILY AS NEEDED FOR DIZZINESS. 01/10/17  Yes Bing NeighborsHarris, Kimberly S, FNP  metoprolol succinate (TOPROL-XL) 50 MG 24 hr tablet Take 2 tablets (100 mg total) by mouth daily. Take with or immediately following a meal. 10/16/16  Yes Bing NeighborsHarris, Kimberly S, FNP  nystatin (MYCOSTATIN/NYSTOP) powder APPLY TWO SPRINKLES OF POWDER TO THE AFFECTED INNER THIGHS TO PREVENT ITCHING AND FUNGAL RASH 11/12/16  Yes Bing NeighborsHarris, Kimberly S, FNP  pantoprazole (PROTONIX) 40 MG tablet Take 1 tablet (40 mg total) by mouth 2 (two) times daily before a meal. 10/16/16  Yes Bing NeighborsHarris, Kimberly S, FNP  PARoxetine (PAXIL) 30 MG tablet Take 30 mg by mouth every morning.   Yes [provider]  simvastatin (ZOCOR) 20 MG tablet TAKE ONE TABLET   (20 MG TOTAL ) BY MOUTH   AT BEDTIME 11/20/16  Yes Bing NeighborsHarris, Kimberly S, FNP  solifenacin (VESICARE) 5 MG tablet Take 1 tablet (5 mg total) by mouth  daily. 11/20/16 11/20/17 Yes Bing NeighborsHarris, Kimberly S, FNP    Physical Exam: Vitals:   03/17/17 1400 03/17/17 1409 03/17/17 1430 03/17/17 1456  BP: (!) 100/57  99/61 99/61  Pulse: 66  69 69  Resp: (!) 23  20 16   Temp:      TempSrc:      SpO2: 94% 97% 100% 91%  Weight:        Constitutional: NAD, calm, comfortable Vitals:   03/17/17 1400 03/17/17 1409 03/17/17 1430 03/17/17 1456  BP: (!) 100/57  99/61 99/61  Pulse: 66  69 69  Resp: (!) 23  20 16   Temp:      TempSrc:      SpO2: 94% 97% 100% 91%  Weight:       General: Not in distress, morbidly obese Eyes: PERRL,  lids and conjunctivae normal ENMT: Mucous membranes are moist. Posterior pharynx clear of any exudate or lesions.Normal dentition.  Neck: normal, supple, no masses, no thyromegaly Respiratory: Bilateral slightly decreased air entry, occasional wheezes. Normal respiratory effort. No accessory muscle use.  Cardiovascular: Regular rate and rhythm, no murmurs / rubs / gallops. No extremity edema. 2+ pedal pulses. No carotid bruits.  Abdomen: no tenderness, no masses palpated. No hepatosplenomegaly. Bowel sounds positive.  Musculoskeletal: no clubbing / cyanosis. No joint deformity upper and lower extremities. Good ROM, no contractures. Normal muscle tone.  Skin: no rashes, lesions, ulcers. No induration Neurologic: CN 2-12 grossly intact. Sensation intact, DTR normal. Strength 5/5 in all 4.  Psychiatric: Normal judgment and insight. Alert and oriented x 3. Normal mood.   Foley Catheter:None  Labs on Admission: I have personally reviewed following labs and imaging studies  CBC: Recent Labs  Lab 03/17/17 1248  WBC 14.6*  NEUTROABS 10.3*  HGB 13.8  HCT 42.5  MCV 82.0  PLT 299   Basic Metabolic Panel: Recent Labs  Lab 03/17/17 1248  NA 136  K 4.3  CL 99*  CO2 29  GLUCOSE 167*  BUN 6  CREATININE 0.77  CALCIUM 9.3  MG 2.0   GFR: Estimated Creatinine Clearance: 93.6 mL/min (by C-G formula based on SCr of 0.77  mg/dL). Liver Function Tests: Recent Labs  Lab 03/17/17 1248  AST 23  ALT 18  ALKPHOS 82  BILITOT 0.5  PROT 8.0  ALBUMIN 3.9   No results for input(s): LIPASE, AMYLASE in the last 168 hours. No results for input(s): AMMONIA in the last 168 hours. Coagulation Profile: No results for input(s): INR, PROTIME in the last 168 hours. Cardiac Enzymes: No results for input(s): CKTOTAL, CKMB, CKMBINDEX, TROPONINI in the last 168 hours. BNP (last 3 results) No results for input(s): PROBNP in the last 8760 hours. HbA1C: No results for input(s): HGBA1C in the last 72 hours. CBG: No results for input(s): GLUCAP in the last 168 hours. Lipid Profile: No results for input(s): CHOL, HDL, LDLCALC, TRIG, CHOLHDL, LDLDIRECT in the last 72 hours. Thyroid Function Tests: No results for input(s): TSH, T4TOTAL, FREET4, T3FREE, THYROIDAB in the last 72 hours. Anemia Panel: No results for input(s): VITAMINB12, FOLATE, FERRITIN, TIBC, IRON, RETICCTPCT in the last 72 hours. Urine analysis:    Component Value Date/Time   COLORURINE YELLOW 11/15/2016 0020   APPEARANCEUR HAZY (A) 11/15/2016 0020   LABSPEC 1.020 11/20/2016 1401   PHURINE 5.5 11/20/2016 1401   GLUCOSEU NEGATIVE 11/20/2016 1401   HGBUR MODERATE (A) 11/20/2016 1401   BILIRUBINUR NEGATIVE 11/20/2016 1401   KETONESUR NEGATIVE 11/20/2016 1401   PROTEINUR 30 (A) 11/20/2016 1401   UROBILINOGEN 1.0 11/20/2016 1401   NITRITE NEGATIVE 11/20/2016 1401   LEUKOCYTESUR NEGATIVE 11/20/2016 1401    Radiological Exams on Admission: Dg Chest 2 View  Result Date: 03/17/2017 CLINICAL DATA:  Cough, wheezing, chills, shortness of Breath EXAM: CHEST  2 VIEW COMPARISON:  02/24/2017 FINDINGS: Airspace opacity noted in the right upper lobe compatible with pneumonia. Left lung clear. Heart is upper limits normal in size. No effusions or acute bony abnormality. IMPRESSION: Right upper lobe pneumonia. Electronically Signed   By: Charlett Nose M.D.   On:  03/17/2017 11:51     Assessment/Plan Principal Problem:   CAP (community acquired pneumonia) Active Problems:   Fibromyalgia   Essential hypertension   Hyperlipidemia   Neuropathic pain   Gastroesophageal reflux disease without esophagitis   Bipolar 2 disorder (HCC)  Acute respiratory failure with hypoxia (HCC)   Asthma  Acute respiratory failure with hypoxia: Secondary to pneumonia.  Current respiratory status has stabilized.  Continue supplemental oxygen as needed.    Community-acquired pneumonia: Chest x-ray done in the emergency department showed right upper lobe pneumonia. Started on azithromycin and ceftriaxone.  Will continue bronchodilators.  Continue supplemental oxygen as needed.  We will follow-up sputum culture, blood culture. We will have also sent respiratory viral panel .Flu test found to be negative. Patient has leukocytosis.  We will continue to monitor the trend. She was given a dose of Solu-Medrol in the emergency department.  Patient currently does not have any wheezes.  Will hold on steroids for now.   Continue Mucinex  Asthma: We will continue her home inhalers.  Continue supplemental oxygen as needed  Hypertension: Currently blood pressure on the lower side.  We will continue to monitor the blood pressure.  We will resume her home medication for now.  Hyperlipidemia: Continue statin.  History of GERD: Continue Protonix  History of bipolar disorder type II/PTSD/anxiety: She is on lithium and Lamictal.  Also on paroxetine.  We will continue this.  Level of lithium found to be low on presentation.  Currently her mood is stable. Patient follows with her psychiatrist as an outpatient.  History of neuropathic pain: We will continue gabapentin.  History of drug abuse/smoker/alcohol abuse: Counseled to quit these.  Severity of Illness:   I certify that at the point of admission it is my clinical judgment that the patient will require inpatient hospital care  spanning beyond 2 midnights from the point of admission due to high intensity of service, high risk for further deterioration and high frequency of surveillance required    DVT prophylaxis: Lovenox Code Status: Full Family Communication: None present at the bedside Consults called: None     Meredith Leeds MD [facility] Pager [phone]  If 7PM-7AM, please contact night-coverage [email or web address] Password [facility]  03/17/2017, 3:51 PM

## 2017-03-18 LAB — RESPIRATORY PANEL BY PCR
ADENOVIRUS-RVPPCR: NOT DETECTED
Bordetella pertussis: NOT DETECTED
CORONAVIRUS 229E-RVPPCR: NOT DETECTED
CORONAVIRUS OC43-RVPPCR: NOT DETECTED
Chlamydophila pneumoniae: NOT DETECTED
Coronavirus HKU1: NOT DETECTED
Coronavirus NL63: NOT DETECTED
INFLUENZA A H1-RVPPCR: NOT DETECTED
INFLUENZA A-RVPPCR: NOT DETECTED
Influenza A H1 2009: NOT DETECTED
Influenza A H3: NOT DETECTED
Influenza B: NOT DETECTED
METAPNEUMOVIRUS-RVPPCR: NOT DETECTED
Mycoplasma pneumoniae: NOT DETECTED
PARAINFLUENZA VIRUS 1-RVPPCR: NOT DETECTED
PARAINFLUENZA VIRUS 2-RVPPCR: NOT DETECTED
PARAINFLUENZA VIRUS 4-RVPPCR: NOT DETECTED
Parainfluenza Virus 3: NOT DETECTED
RESPIRATORY SYNCYTIAL VIRUS-RVPPCR: NOT DETECTED
Rhinovirus / Enterovirus: NOT DETECTED

## 2017-03-18 LAB — COMPREHENSIVE METABOLIC PANEL
ALT: 18 U/L (ref 14–54)
AST: 26 U/L (ref 15–41)
Albumin: 3.5 g/dL (ref 3.5–5.0)
Alkaline Phosphatase: 72 U/L (ref 38–126)
Anion gap: 10 (ref 5–15)
BILIRUBIN TOTAL: 0.2 mg/dL — AB (ref 0.3–1.2)
BUN: 12 mg/dL (ref 6–20)
CALCIUM: 9.1 mg/dL (ref 8.9–10.3)
CO2: 25 mmol/L (ref 22–32)
Chloride: 97 mmol/L — ABNORMAL LOW (ref 101–111)
Creatinine, Ser: 0.81 mg/dL (ref 0.44–1.00)
GFR calc Af Amer: >60 mL/min (ref >60)
Glucose, Bld: 252 mg/dL — ABNORMAL HIGH (ref 65–99)
Potassium: 4.5 mmol/L (ref 3.5–5.1)
Sodium: 132 mmol/L — ABNORMAL LOW (ref 135–145)
Total Protein: 7.4 g/dL (ref 6.5–8.1)

## 2017-03-18 LAB — GLUCOSE, CAPILLARY: Glucose-Capillary: 194 mg/dL — ABNORMAL HIGH (ref 65–99)

## 2017-03-18 LAB — CBC WITH DIFFERENTIAL/PLATELET
BASOS ABS: 0 10*3/uL (ref 0.0–0.1)
Basophils Relative: 0 %
Eosinophils Absolute: 0 10*3/uL (ref 0.0–0.7)
Eosinophils Relative: 0 %
HEMATOCRIT: 38.1 % (ref 36.0–46.0)
Hemoglobin: 12.3 g/dL (ref 12.0–15.0)
LYMPHS ABS: 1.8 10*3/uL (ref 0.7–4.0)
LYMPHS PCT: 14 %
MCH: 26.5 pg (ref 26.0–34.0)
MCHC: 32.3 g/dL (ref 30.0–36.0)
MCV: 81.9 fL (ref 78.0–100.0)
MONO ABS: 0.5 10*3/uL (ref 0.1–1.0)
Monocytes Relative: 4 %
NEUTROS ABS: 10.2 10*3/uL — AB (ref 1.7–7.7)
Neutrophils Relative %: 82 %
Platelets: 295 10*3/uL (ref 150–400)
RBC: 4.65 MIL/uL (ref 3.87–5.11)
RDW: 15.4 % (ref 11.5–15.5)
WBC: 12.6 10*3/uL — ABNORMAL HIGH (ref 4.0–10.5)

## 2017-03-18 LAB — EXPECTORATED SPUTUM ASSESSMENT W GRAM STAIN, RFLX TO RESP C

## 2017-03-18 LAB — EXPECTORATED SPUTUM ASSESSMENT W REFEX TO RESP CULTURE

## 2017-03-18 MED ORDER — SODIUM CHLORIDE 0.9 % IV SOLN
1.0000 g | INTRAVENOUS | Status: DC
  Administered 2017-03-18 – 2017-03-19 (×2): 1 g via INTRAVENOUS
  Filled 2017-03-18 (×2): qty 1
  Filled 2017-03-18: qty 10

## 2017-03-18 MED ORDER — SODIUM CHLORIDE 0.9 % IV SOLN
500.0000 mg | INTRAVENOUS | Status: DC
  Administered 2017-03-18: 500 mg via INTRAVENOUS
  Filled 2017-03-18 (×2): qty 500

## 2017-03-18 MED ORDER — IPRATROPIUM-ALBUTEROL 0.5-2.5 (3) MG/3ML IN SOLN
3.0000 mL | Freq: Three times a day (TID) | RESPIRATORY_TRACT | Status: DC
  Administered 2017-03-18 – 2017-03-20 (×6): 3 mL via RESPIRATORY_TRACT
  Filled 2017-03-18 (×6): qty 3

## 2017-03-18 NOTE — Progress Notes (Addendum)
PROGRESS NOTE    Kathleen Herrera  ZOX:[id number] DOB: 16-Apr-1960 DOA: 03/17/2017 PCP: Bing Neighbors, FNP   Brief Narrative: Kathleen Herrera is a 57 y.o. female with medical history significant of asthma, hypertension, hyperlipidemia, bipolar type II, PTSD, anxiety, smoker who presents to the emergency department from home with complaints of shortness of breath that started about 2-3 days ago. Patient was found to be desaturating on presentation.Chest x-ray done in the emergency department was positive of right upper lobe pneumonia.    Assessment & Plan:   Principal Problem:   CAP (community acquired pneumonia) Active Problems:   Fibromyalgia   Essential hypertension   Hyperlipidemia   Neuropathic pain   Gastroesophageal reflux disease without esophagitis   Bipolar 2 disorder (HCC)   Acute respiratory failure with hypoxia (HCC)   Asthma  Acute respiratory failure with hypoxia: Much stable now.Secondary to pneumonia.  Current respiratory status has stabilized.  Continue supplemental oxygen as needed.    Community-acquired pneumonia: Chest x-ray done in the emergency department showed right upper lobe pneumonia. Started on azithromycin and ceftriaxone.  Will continue bronchodilators.  Continue supplemental oxygen as needed.  We will follow-up sputum culture, blood culture. We will have also sent respiratory viral panel .Flu test found to be negative. Patient has leukocytosis.  We will continue to monitor the trend. She was given a dose of Solu-Medrol in the emergency department.  Patient currently does not have any wheezes.  Will hold on steroids for now.   Continue Mucinex  Asthma: We will continue her home inhalers.  Continue supplemental oxygen as needed  Hypertension: Currently blood pressure on the lower side.  We will continue to monitor the blood pressure.  We l resumed her home medication .  Hyperlipidemia: Continue statin.  History of GERD: Continue  Protonix  History of bipolar disorder type II/PTSD/anxiety: She is on lithium and Lamictal.  Also on paroxetine.  We will continue this.  Level of lithium found to be low on presentation.  Currently her mood is stable. Patient follows with her psychiatrist as an outpatient.  History of neuropathic pain: We will continue gabapentin.  History of drug abuse/smoker/alcohol abuse: Counseled to quit these.   DVT prophylaxis: Lovenox Code Status: Full Family Communication: None present at the bedside Disposition Plan: Home in 1-2 days   Consultants: None  Procedures:None  Antimicrobials: Ceftriaxone and azithromycin since 03/17/17  Subjective: Patient seen and examined the bedside this morning.  Remains comfortable.  Respiratory status has stabilized.  She is still coughing and bringing up some phlegm.  Objective: Vitals:   03/17/17 2215 03/18/17 0527 03/18/17 0814 03/18/17 1435  BP: 132/69 118/65    Pulse: 70 65    Resp: 20 20    Temp: 98.7 F (37.1 C) 98.4 F (36.9 C)    TempSrc: Oral Oral    SpO2: 99% 99% 95% 92%  Weight:  112 kg (246 lb 14.6 oz)    Height:        Intake/Output Summary (Last 24 hours) at 03/18/2017 1444 Last data filed at 03/17/2017 1613 Gross per 24 hour  Intake 250 ml  Output -  Net 250 ml   Filed Weights   03/17/17 1127 03/17/17 1739 03/18/17 0527  Weight: 113.4 kg (250 lb) 110.4 kg (243 lb 6.2 oz) 112 kg (246 lb 14.6 oz)    Examination:  General exam: Appears calm and comfortable ,Not in distress,average built HEENT:PERRL,Oral mucosa moist, Ear/Nose normal on gross exam Respiratory system: Bilateral mildly decreased  air entry Cardiovascular system: S1 & S2 heard, RRR. No JVD, murmurs, rubs, gallops or clicks. No pedal edema. Gastrointestinal system: Abdomen is nondistended, soft and nontender. No organomegaly or masses felt. Normal bowel sounds heard. Central nervous system: Alert and oriented. No focal neurological deficits. Extremities:  No edema, no clubbing ,no cyanosis, distal peripheral pulses palpable. Skin: No rashes, lesions or ulcers,no icterus ,no pallor MSK: Normal muscle bulk,tone ,power Psychiatry: Judgement and insight appear normal. Mood & affect appropriate.     Data Reviewed: I have personally reviewed following labs and imaging studies  CBC: Recent Labs  Lab 03/17/17 1248 03/18/17 0952  WBC 14.6* 12.6*  NEUTROABS 10.3* 10.2*  HGB 13.8 12.3  HCT 42.5 38.1  MCV 82.0 81.9  PLT 299 295   Basic Metabolic Panel: Recent Labs  Lab 03/17/17 1248 03/18/17 0402  NA 136 132*  K 4.3 4.5  CL 99* 97*  CO2 29 25  GLUCOSE 167* 252*  BUN 6 12  CREATININE 0.77 0.81  CALCIUM 9.3 9.1  MG 2.0  --    GFR: Estimated Creatinine Clearance: 92.2 mL/min (by C-G formula based on SCr of 0.81 mg/dL). Liver Function Tests: Recent Labs  Lab 03/17/17 1248 03/18/17 0402  AST 23 26  ALT 18 18  ALKPHOS 82 72  BILITOT 0.5 0.2*  PROT 8.0 7.4  ALBUMIN 3.9 3.5   No results for input(s): LIPASE, AMYLASE in the last 168 hours. No results for input(s): AMMONIA in the last 168 hours. Coagulation Profile: No results for input(s): INR, PROTIME in the last 168 hours. Cardiac Enzymes: No results for input(s): CKTOTAL, CKMB, CKMBINDEX, TROPONINI in the last 168 hours. BNP (last 3 results) No results for input(s): PROBNP in the last 8760 hours. HbA1C: No results for input(s): HGBA1C in the last 72 hours. CBG: No results for input(s): GLUCAP in the last 168 hours. Lipid Profile: No results for input(s): CHOL, HDL, LDLCALC, TRIG, CHOLHDL, LDLDIRECT in the last 72 hours. Thyroid Function Tests: No results for input(s): TSH, T4TOTAL, FREET4, T3FREE, THYROIDAB in the last 72 hours. Anemia Panel: No results for input(s): VITAMINB12, FOLATE, FERRITIN, TIBC, IRON, RETICCTPCT in the last 72 hours. Sepsis Labs: Recent Labs  Lab 03/17/17 1302  LATICACIDVEN 1.53    Recent Results (from the past 240 hour(s))  Rapid strep  screen     Status: None   Collection Time: 03/17/17 11:32 AM  Result Value Ref Range Status   Streptococcus, Group A Screen (Direct) NEGATIVE NEGATIVE Final    Comment: (NOTE) A Rapid Antigen test may result negative if the antigen level in the sample is below the detection level of this test. The FDA has not cleared this test as a stand-alone test therefore the rapid antigen negative result has reflexed to a Group A Strep culture. Performed at [facility], [address] [place], [place] [address]   Culture, expectorated sputum-assessment     Status: None   Collection Time: 03/18/17  7:23 AM  Result Value Ref Range Status   Specimen Description SPUTUM  Final   Special Requests NONE  Final   Sputum evaluation   Final    THIS SPECIMEN IS ACCEPTABLE FOR SPUTUM CULTURE Performed at [facility], [address] [place], [place] [address]    Report Status 03/18/2017 FINAL  Final         Radiology Studies: Dg Chest 2 View  Result Date: 03/17/2017 CLINICAL DATA:  Cough, wheezing, chills, shortness of Breath EXAM: CHEST  2 VIEW COMPARISON:  02/24/2017 FINDINGS: Airspace opacity noted in the right upper lobe compatible with pneumonia. Left lung clear. Heart is upper limits normal in size. No effusions or acute bony abnormality. IMPRESSION: Right upper lobe pneumonia. Electronically Signed   By: Charlett Nose M.D.   On: 03/17/2017 11:51        Scheduled Meds: . amLODipine  10 mg Oral Daily  . aspirin  325 mg Oral Daily  . darifenacin  7.5 mg Oral Daily  . enoxaparin (LOVENOX) injection  55 mg Subcutaneous Q24H  . fluticasone furoate-vilanterol  1 puff Inhalation Daily  . gabapentin  300 mg Oral TID  . guaiFENesin  1,200 mg Oral BID  . ipratropium-albuterol  3 mL Nebulization TID  . lamoTRIgine  25 mg Oral BID  . lithium carbonate  300 mg Oral [id number]  . lithium  600 mg Oral QHS  . metoprolol succinate  100 mg Oral Q breakfast  .  pantoprazole  40 mg Oral BID AC  . PARoxetine  30 mg Oral q morning - 10a  . simvastatin  20 mg Oral QHS   Continuous Infusions: . sodium chloride 75 mL/hr at 03/17/17 2132  . azithromycin    . cefTRIAXone (ROCEPHIN)  IV 1 g (03/18/17 1259)     LOS: 1 day    Time spent: 25 mins.More than 50% of that time was spent in counseling and/or coordination of care.      Meredith Leeds, MD [facility] Pager [phone]  If 7PM-7AM, please contact night-coverage [email or web address] Password [facility] 03/18/2017, 2:44 PM

## 2017-03-19 ENCOUNTER — Ambulatory Visit: Payer: Self-pay | Admitting: Family Medicine

## 2017-03-19 DIAGNOSIS — F3181 Bipolar II disorder: Secondary | ICD-10-CM

## 2017-03-19 LAB — CBC WITH DIFFERENTIAL/PLATELET
Basophils Absolute: 0 10*3/uL (ref 0.0–0.1)
Basophils Relative: 0 %
EOS ABS: 0 10*3/uL (ref 0.0–0.7)
EOS PCT: 0 %
HCT: 39.5 % (ref 36.0–46.0)
Hemoglobin: 12.5 g/dL (ref 12.0–15.0)
Lymphocytes Relative: 26 %
Lymphs Abs: 3.7 10*3/uL (ref 0.7–4.0)
MCH: 26.1 pg (ref 26.0–34.0)
MCHC: 31.6 g/dL (ref 30.0–36.0)
MCV: 82.5 fL (ref 78.0–100.0)
MONO ABS: 1 10*3/uL (ref 0.1–1.0)
Monocytes Relative: 7 %
NEUTROS PCT: 67 %
Neutro Abs: 9.4 10*3/uL — ABNORMAL HIGH (ref 1.7–7.7)
PLATELETS: 328 10*3/uL (ref 150–400)
RBC: 4.79 MIL/uL (ref 3.87–5.11)
RDW: 15.4 % (ref 11.5–15.5)
WBC: 14.1 10*3/uL — AB (ref 4.0–10.5)

## 2017-03-19 MED ORDER — AZITHROMYCIN 250 MG PO TABS
500.0000 mg | ORAL_TABLET | Freq: Every day | ORAL | Status: DC
  Administered 2017-03-19 – 2017-03-20 (×2): 500 mg via ORAL
  Filled 2017-03-19 (×2): qty 2

## 2017-03-19 MED ORDER — BENZONATATE 100 MG PO CAPS
200.0000 mg | ORAL_CAPSULE | Freq: Three times a day (TID) | ORAL | Status: DC
  Administered 2017-03-19 – 2017-03-20 (×3): 200 mg via ORAL
  Filled 2017-03-19 (×3): qty 2

## 2017-03-19 NOTE — Progress Notes (Signed)
PROGRESS NOTE    Kathleen Herrera  ZOX:[id number] DOB: February 28, 1960 DOA: 03/17/2017 PCP: Bing Neighbors, FNP   Brief Narrative: Kathleen Herrera is a 57 y.o. female with medical history significant of asthma, hypertension, hyperlipidemia, bipolar type II, PTSD, anxiety, smoker who presents to the emergency department from home with complaints of shortness of breath that started about 2-3 days ago. Patient was found to be desaturating on presentation.Chest x-ray done in the emergency department was positive of right upper lobe pneumonia.    Assessment & Plan:   Principal Problem:   CAP (community acquired pneumonia) Active Problems:   Fibromyalgia   Essential hypertension   Hyperlipidemia   Neuropathic pain   Gastroesophageal reflux disease without esophagitis   Bipolar 2 disorder (HCC)   Acute respiratory failure with hypoxia (HCC)   Asthma  Acute respiratory failure with hypoxia: Much stable now.Secondary to pneumonia.  Current respiratory status has stabilized.  Continue supplemental oxygen as needed.    Community-acquired pneumonia: Chest x-ray done in the emergency department showed right upper lobe pneumonia. Started on azithromycin and ceftriaxone.  Will continue bronchodilators.  Continue supplemental oxygen as needed.  We will follow-up sputum culture, blood culture. We will have also sent respiratory viral panel .Flu test found to be negative. Patient has leukocytosis.  We will continue to monitor the trend. She was given a dose of Solu-Medrol in the emergency department.  Patient currently does not have any wheezes.  Will hold on steroids for now.   Continue Mucinex  Asthma: We will continue her home inhalers.  Continue supplemental oxygen as needed  Hypertension: BP stable.  We will continue to monitor the blood pressure.  We  resumed her home medications .  Hyperlipidemia: Continue statin.  History of GERD: Continue Protonix  History of bipolar disorder  type II/PTSD/anxiety: She is on lithium and Lamictal.  Also on paroxetine.  We will continue this.  Level of lithium found to be low on presentation.  Currently her mood is stable. Patient follows with her psychiatrist as an outpatient.  History of neuropathic pain: We will continue gabapentin.  History of drug abuse/smoker/alcohol abuse: Counseled to quit these.   DVT prophylaxis: Lovenox Code Status: Full Family Communication: None present at the bedside Disposition Plan: Home tomorrow   Consultants: None  Procedures:None  Antimicrobials: Ceftriaxone and azithromycin since 03/17/17  Subjective: Patient seen and examined the bedside this morning.  Remains comfortable.  Discussed discharge planning today but she wants to go tomorrow.  Complains of cough.  Objective: Vitals:   03/18/17 2136 03/19/17 0623 03/19/17 0829 03/19/17 1045  BP: 110/66 114/70  116/70  Pulse: 63 60  (!) 54  Resp: 20 20    Temp: 98.6 F (37 C) 98.5 F (36.9 C)  98.6 F (37 C)  TempSrc: Oral Oral  Oral  SpO2: 98% 94% 97% 96%  Weight:      Height:        Intake/Output Summary (Last 24 hours) at 03/19/2017 1224 Last data filed at 03/18/2017 1900 Gross per 24 hour  Intake 2281.25 ml  Output -  Net 2281.25 ml   Filed Weights   03/17/17 1127 03/17/17 1739 03/18/17 0527  Weight: 113.4 kg (250 lb) 110.4 kg (243 lb 6.2 oz) 112 kg (246 lb 14.6 oz)    Examination:  General exam: Appears calm and comfortable ,Not in distress,average built HEENT:PERRL,Oral mucosa moist, Ear/Nose normal on gross exam Respiratory system: Bilateral mildly decreased air entry Cardiovascular system: S1 & S2 heard,  RRR. No JVD, murmurs, rubs, gallops or clicks. No pedal edema. Gastrointestinal system: Abdomen is nondistended, soft and nontender. No organomegaly or masses felt. Normal bowel sounds heard. Central nervous system: Alert and oriented. No focal neurological deficits. Extremities: No edema, no clubbing ,no  cyanosis, distal peripheral pulses palpable. Skin: No rashes, lesions or ulcers,no icterus ,no pallor MSK: Normal muscle bulk,tone ,power Psychiatry: Judgement and insight appear normal. Mood & affect appropriate.     Data Reviewed: I have personally reviewed following labs and imaging studies  CBC: Recent Labs  Lab 03/17/17 1248 03/18/17 0952 03/19/17 0359  WBC 14.6* 12.6* 14.1*  NEUTROABS 10.3* 10.2* 9.4*  HGB 13.8 12.3 12.5  HCT 42.5 38.1 39.5  MCV 82.0 81.9 82.5  PLT 299 295 328   Basic Metabolic Panel: Recent Labs  Lab 03/17/17 1248 03/18/17 0402  NA 136 132*  K 4.3 4.5  CL 99* 97*  CO2 29 25  GLUCOSE 167* 252*  BUN 6 12  CREATININE 0.77 0.81  CALCIUM 9.3 9.1  MG 2.0  --    GFR: Estimated Creatinine Clearance: 92.2 mL/min (by C-G formula based on SCr of 0.81 mg/dL). Liver Function Tests: Recent Labs  Lab 03/17/17 1248 03/18/17 0402  AST 23 26  ALT 18 18  ALKPHOS 82 72  BILITOT 0.5 0.2*  PROT 8.0 7.4  ALBUMIN 3.9 3.5   No results for input(s): LIPASE, AMYLASE in the last 168 hours. No results for input(s): AMMONIA in the last 168 hours. Coagulation Profile: No results for input(s): INR, PROTIME in the last 168 hours. Cardiac Enzymes: No results for input(s): CKTOTAL, CKMB, CKMBINDEX, TROPONINI in the last 168 hours. BNP (last 3 results) No results for input(s): PROBNP in the last 8760 hours. HbA1C: No results for input(s): HGBA1C in the last 72 hours. CBG: Recent Labs  Lab 03/18/17 2138  GLUCAP 194*   Lipid Profile: No results for input(s): CHOL, HDL, LDLCALC, TRIG, CHOLHDL, LDLDIRECT in the last 72 hours. Thyroid Function Tests: No results for input(s): TSH, T4TOTAL, FREET4, T3FREE, THYROIDAB in the last 72 hours. Anemia Panel: No results for input(s): VITAMINB12, FOLATE, FERRITIN, TIBC, IRON, RETICCTPCT in the last 72 hours. Sepsis Labs: Recent Labs  Lab 03/17/17 1302  LATICACIDVEN 1.53    Recent Results (from the past 240 hour(s))   Rapid strep screen     Status: None   Collection Time: 03/17/17 11:32 AM  Result Value Ref Range Status   Streptococcus, Group A Screen (Direct) NEGATIVE NEGATIVE Final    Comment: (NOTE) A Rapid Antigen test may result negative if the antigen level in the sample is below the detection level of this test. The FDA has not cleared this test as a stand-alone test therefore the rapid antigen negative result has reflexed to a Group A Strep culture. Performed at [facility], [address], [place], [place] [address]   Blood culture (routine x 2)     Status: None (Preliminary result)   Collection Time: 03/17/17 12:49 PM  Result Value Ref Range Status   Specimen Description   Final    BLOOD RIGHT FOREARM Performed at [facility], [address] [place], [place] [address]    Special Requests   Final    BOTTLES DRAWN AEROBIC AND ANAEROBIC Blood Culture adequate volume Performed at [facility], [address], [place], [place] [address]    Culture   Final    NO GROWTH 1 DAY Performed at [facility],  [address] [place], [place] [address]    Report Status PENDING  Incomplete  Blood culture (routine x 2)     Status: None (Preliminary result)   Collection Time: 03/17/17  1:12 PM  Result Value Ref Range Status   Specimen Description   Final    BLOOD RIGHT FOREARM Performed at [facility], [address] [place], [place] [address]    Special Requests   Final    BOTTLES DRAWN AEROBIC AND ANAEROBIC Blood Culture adequate volume Performed at [facility], [address] [place], [place] [address]    Culture   Final    NO GROWTH 1 DAY Performed at [facility], [address] [place], [place] [address]    Report Status PENDING  Incomplete  Respiratory Panel by PCR     Status: None   Collection Time: 03/17/17  3:44 PM  Result Value Ref Range Status   Adenovirus NOT  DETECTED NOT DETECTED Final   Coronavirus 229E NOT DETECTED NOT DETECTED Final   Coronavirus HKU1 NOT DETECTED NOT DETECTED Final   Coronavirus NL63 NOT DETECTED NOT DETECTED Final   Coronavirus OC43 NOT DETECTED NOT DETECTED Final   Metapneumovirus NOT DETECTED NOT DETECTED Final   Rhinovirus / Enterovirus NOT DETECTED NOT DETECTED Final   Influenza A NOT DETECTED NOT DETECTED Final   Influenza A H1 NOT DETECTED NOT DETECTED Final   Influenza A H1 2009 NOT DETECTED NOT DETECTED Final   Influenza A H3 NOT DETECTED NOT DETECTED Final   Influenza B NOT DETECTED NOT DETECTED Final   Parainfluenza Virus 1 NOT DETECTED NOT DETECTED Final   Parainfluenza Virus 2 NOT DETECTED NOT DETECTED Final   Parainfluenza Virus 3 NOT DETECTED NOT DETECTED Final   Parainfluenza Virus 4 NOT DETECTED NOT DETECTED Final   Respiratory Syncytial Virus NOT DETECTED NOT DETECTED Final   Bordetella pertussis NOT DETECTED NOT DETECTED Final   Chlamydophila pneumoniae NOT DETECTED NOT DETECTED Final   Mycoplasma pneumoniae NOT DETECTED NOT DETECTED Final    Comment: Performed at [facility], [address] [place], [place] [address]  Culture, expectorated sputum-assessment     Status: None   Collection Time: 03/18/17  7:23 AM  Result Value Ref Range Status   Specimen Description SPUTUM  Final   Special Requests NONE  Final   Sputum evaluation   Final    THIS SPECIMEN IS ACCEPTABLE FOR SPUTUM CULTURE Performed at [facility], [address] [place], [place] [address]    Report Status 03/18/2017 FINAL  Final  Culture, respiratory (NON-Expectorated)     Status: None (Preliminary result)   Collection Time: 03/18/17  7:23 AM  Result Value Ref Range Status   Specimen Description   Final    SPUTUM Performed at [facility], [address] [place], [place] [address]    Special Requests   Final    NONE Reflexed from [id number] Performed at [facility],  [address] [place], [place] [address]    Gram Stain   Final    MODERATE WBC PRESENT,BOTH PMN AND MONONUCLEAR RARE GRAM POSITIVE COCCI RARE GRAM VARIABLE ROD    Culture   Final    CULTURE REINCUBATED FOR BETTER GROWTH Performed at [facility], [address] [place], [place] [address]    Report Status PENDING  Incomplete         Radiology Studies: No results found.  Scheduled Meds: . amLODipine  10 mg Oral Daily  . aspirin  325 mg Oral Daily  . azithromycin  500 mg Oral Daily  . benzonatate  200 mg Oral TID  . darifenacin  7.5 mg Oral Daily  . enoxaparin (LOVENOX) injection  55 mg Subcutaneous Q24H  . fluticasone furoate-vilanterol  1 puff Inhalation Daily  . gabapentin  300 mg Oral TID  . guaiFENesin  1,200 mg Oral BID  . ipratropium-albuterol  3 mL Nebulization TID  . lamoTRIgine  25 mg Oral BID  . lithium carbonate  300 mg Oral BH-q7a  . lithium  600 mg Oral QHS  . metoprolol succinate  100 mg Oral Q breakfast  . pantoprazole  40 mg Oral BID AC  . PARoxetine  30 mg Oral q morning - 10a  . simvastatin  20 mg Oral QHS   Continuous Infusions: . sodium chloride 75 mL/hr at 03/18/17 2305  . cefTRIAXone (ROCEPHIN)  IV 1 g (03/19/17 1215)     LOS: 2 days    Time spent: 25 mins.More than 50% of that time was spent in counseling and/or coordination of care.      Meredith Leeds, MD [facility] Pager [phone]  If 7PM-7AM, please contact night-coverage [email or web address] Password [facility] 03/19/2017, 12:24 PM

## 2017-03-19 NOTE — Progress Notes (Signed)
Nutrition Brief Note  Patient identified on the Malnutrition Screening Tool (MST) Report  Pt with insignificant weight loss. Pt eating 100% of meals.  Wt Readings from Last 15 Encounters:  03/18/17 246 lb 14.6 oz (112 kg)  11/20/16 240 lb (108.9 kg)  11/02/16 249 lb (112.9 kg)  10/16/16 244 lb (110.7 kg)  10/12/16 250 lb (113.4 kg)  08/16/16 254 lb (115.2 kg)  03/22/16 253 lb (114.8 kg)  03/12/16 254 lb 12.8 oz (115.6 kg)  11/18/15 261 lb (118.4 kg)  09/19/15 124 lb 4.8 oz (56.4 kg)  07/15/15 286 lb (129.7 kg)  05/09/15 270 lb (122.5 kg)  03/28/15 262 lb (118.8 kg)  01/09/15 254 lb (115.2 kg)  12/23/14 259 lb (117.5 kg)    Body mass index is 43.74 kg/m. Patient meets criteria for morbid obesity based on current BMI.   Current diet order is Heart Healthy , patient is consuming approximately 100% of meals at this time. Labs and medications reviewed.   No nutrition interventions warranted at this time. If nutrition issues arise, please consult RD.   Tilda FrancoLindsey Carletha Dawn, MS, RD, LDN [facility] Clinical Dietitian Pager: [phone] After Hours Pager: [phone]

## 2017-03-20 LAB — BASIC METABOLIC PANEL
Anion gap: 11 (ref 5–15)
BUN: 8 mg/dL (ref 6–20)
CALCIUM: 9.1 mg/dL (ref 8.9–10.3)
CO2: 28 mmol/L (ref 22–32)
Chloride: 97 mmol/L — ABNORMAL LOW (ref 101–111)
Creatinine, Ser: 0.49 mg/dL (ref 0.44–1.00)
GLUCOSE: 141 mg/dL — AB (ref 65–99)
Potassium: 3.8 mmol/L (ref 3.5–5.1)
Sodium: 136 mmol/L (ref 135–145)

## 2017-03-20 LAB — CULTURE, GROUP A STREP (THRC)

## 2017-03-20 LAB — CBC WITH DIFFERENTIAL/PLATELET
BASOS PCT: 0 %
Basophils Absolute: 0 10*3/uL (ref 0.0–0.1)
EOS PCT: 1 %
Eosinophils Absolute: 0.1 10*3/uL (ref 0.0–0.7)
HEMATOCRIT: 28.6 % — AB (ref 36.0–46.0)
Hemoglobin: 9 g/dL — ABNORMAL LOW (ref 12.0–15.0)
Lymphocytes Relative: 9 %
Lymphs Abs: 0.9 10*3/uL (ref 0.7–4.0)
MCH: 28.3 pg (ref 26.0–34.0)
MCHC: 31.5 g/dL (ref 30.0–36.0)
MCV: 89.9 fL (ref 78.0–100.0)
MONOS PCT: 11 %
Monocytes Absolute: 1.1 10*3/uL — ABNORMAL HIGH (ref 0.1–1.0)
NEUTROS ABS: 8.4 10*3/uL — AB (ref 1.7–7.7)
Neutrophils Relative %: 79 %
PLATELETS: 298 10*3/uL (ref 150–400)
RBC: 3.18 MIL/uL — ABNORMAL LOW (ref 3.87–5.11)
RDW: 19.4 % — ABNORMAL HIGH (ref 11.5–15.5)
WBC: 10.5 10*3/uL (ref 4.0–10.5)

## 2017-03-20 LAB — HEMOGLOBIN A1C
HEMOGLOBIN A1C: 5.4 % (ref 4.8–5.6)
MEAN PLASMA GLUCOSE: 108.28 mg/dL

## 2017-03-20 LAB — CULTURE, RESPIRATORY W GRAM STAIN: Culture: NORMAL

## 2017-03-20 LAB — CULTURE, RESPIRATORY

## 2017-03-20 MED ORDER — AZITHROMYCIN 500 MG PO TABS: ORAL_TABLET | ORAL | 0 refills | Status: DC

## 2017-03-20 MED ORDER — BENZONATATE 200 MG PO CAPS: 200.0000 mg | ORAL_CAPSULE | Freq: Three times a day (TID) | ORAL | 0 refills | Status: DC

## 2017-03-20 MED ORDER — CEFDINIR 300 MG PO CAPS: 300.0000 mg | ORAL_CAPSULE | Freq: Two times a day (BID) | ORAL | 0 refills | Status: AC

## 2017-03-20 NOTE — Progress Notes (Signed)
Consult-Taxi Voucher  CSW confirmed with patient she does not have any other means of transportation. Patient provided CSW with correct Home address [address] [place], [place]  Patient and nurse both agree the [employer] is a safe transport.   Patient will use her cane. CSW completed and signed the [employer] Voucher.   Vivi BarrackNicole Oran Dillenburg, Theresia MajorsLCSWA, MSW Clinical Social Worker  [phone] 03/20/2017  10:54 AM

## 2017-03-20 NOTE — Discharge Summary (Signed)
Physician Discharge Summary  Kathleen Herrera:[id number] DOB: April 26, 1960 DOA: 03/17/2017  PCP: Bing Neighbors, FNP  Admit date: 03/17/2017 Discharge date: 03/20/2017  Admitted From: Home Disposition:  Home  Discharge Condition: Stable CODE STATUS: Full Diet recommendation: Heart Healthy   Brief/Interim Summary: [name] a 57 y.o.femalewith medical history significant ofasthma, hypertension, hyperlipidemia, bipolar type II, PTSD, anxiety,smoker who presents to the emergency department from home with complaints of shortness of breath that started about 2-3 days ago. Patient was found to be desaturating on presentation.Chest x-ray done in the emergency department was positive of right upper lobe pneumonia.  Patient was admitted and started on IV antibiotics.  Her respiratory status gradually improved.  Currently her respiratory status is stable.  She is saturating fine on room air.  Patient is stable for discharge to home today with oral antibiotics to continue at home to finish the course.  Following problems were addressed during hospitalization:  Acute respiratory failure with hypoxia: Much stable now.Secondary to pneumonia. Current respiratory status has stabilized. Off Oxygen.  Community-acquired pneumonia: Chest x-ray done in the emergency department showed right upper lobe pneumonia. Started on azithromycin and ceftriaxone. Cultures negative. Patient had leukocytosis which has resolved.  Asthma:We will continue her home inhalers.  Hypertension: BP stable. We will continue to monitor the blood pressure. We  resumed her home medications .  Hyperlipidemia: Continue statin.  History of GERD: Continue Protonix  History of bipolar disorder type II/PTSD/anxiety: She is on lithium and Lamictal. Also on paroxetine. We will continue this. Level of lithium found to be low on presentation.Currently her mood is stable. Patient follows with her  psychiatrist as an outpatient.  History of neuropathic pain: We will continue gabapentin.  History of drug abuse/smoker/alcohol abuse: Counseled to quit these.     Discharge Diagnoses:  Principal Problem:   CAP (community acquired pneumonia) Active Problems:   Fibromyalgia   Essential hypertension   Hyperlipidemia   Neuropathic pain   Gastroesophageal reflux disease without esophagitis   Bipolar 2 disorder (HCC)   Acute respiratory failure with hypoxia (HCC)   Asthma    Discharge Instructions  Discharge Instructions    Diet - low sodium heart healthy   Complete by:  As directed    Discharge instructions   Complete by:  As directed    1) Please take prescribed medications as instructed. 2)Please stop smoking. 3)Follow up with your PCP in a week.   Increase activity slowly   Complete by:  As directed      Allergies as of 03/20/2017   No Known Allergies     Medication List    TAKE these medications   albuterol 108 (90 Base) MCG/ACT inhaler Commonly known as:  PROVENTIL HFA;VENTOLIN HFA Inhale 2 puffs into the lungs every 6 (six) hours as needed for wheezing or shortness of breath.   amLODipine 10 MG tablet Commonly known as:  NORVASC Take 1 tablet (10 mg total) by mouth daily.   aspirin 325 MG tablet Take 1 tablet (325 mg total) by mouth daily.   azithromycin 500 MG tablet Commonly known as:  ZITHROMAX Take one tablet once a day   benzonatate 200 MG capsule Commonly known as:  TESSALON Take 1 capsule (200 mg total) by mouth 3 (three) times daily.   beta carotene 30 MG capsule Take 30 mg by mouth daily.   betamethasone dipropionate 0.05 % ointment Commonly known as:  DIPROLENE Apply topically 2 (two) times daily. What changed:    how  much to take  when to take this  reasons to take this   budesonide-formoterol 80-4.5 MCG/ACT inhaler Commonly known as:  SYMBICORT Inhale 2 puffs into the lungs 2 (two) times daily.   cefdinir 300 MG  capsule Commonly known as:  OMNICEF Take 1 capsule (300 mg total) by mouth 2 (two) times daily for 4 days.   furosemide 20 MG tablet Commonly known as:  LASIX Take 1 tablet (20 mg total) by mouth daily.   gabapentin 300 MG capsule Commonly known as:  NEURONTIN TAKE 1 CAPSULE (300 MG TOTAL) BY MOUTH THREE TIMES DAILY.   hydrOXYzine 25 MG tablet Commonly known as:  ATARAX/VISTARIL Take 1 tablet (25 mg total) by mouth 3 (three) times daily as needed for itching or anxiety.   lamoTRIgine 25 MG tablet Commonly known as:  LAMICTAL Take 1 tablet (25 mg total) by mouth 2 (two) times daily. For mood control What changed:    when to take this  additional instructions   lithium 600 MG capsule Take 1 capsule (600 mg total) by mouth at bedtime. For mood control   lithium carbonate 300 MG capsule Take 1 capsule (300 mg total) by mouth every morning. For mood control   meclizine 12.5 MG tablet Commonly known as:  ANTIVERT TAKE 1 TABLET (12.5 MG TOTAL) BY MOUTH TWO TIMES DAILY AS NEEDED FOR DIZZINESS. What changed:    how much to take  how to take this  when to take this  additional instructions   metoprolol succinate 50 MG 24 hr tablet Commonly known as:  TOPROL-XL Take 2 tablets (100 mg total) by mouth daily. Take with or immediately following a meal.   nystatin powder Commonly known as:  MYCOSTATIN/NYSTOP APPLY TWO SPRINKLES OF POWDER TO THE AFFECTED INNER THIGHS TO PREVENT ITCHING AND FUNGAL RASH   pantoprazole 40 MG tablet Commonly known as:  PROTONIX Take 1 tablet (40 mg total) by mouth 2 (two) times daily before a meal.   PARoxetine 30 MG tablet Commonly known as:  PAXIL Take 30 mg by mouth every morning.   simvastatin 20 MG tablet Commonly known as:  ZOCOR TAKE ONE TABLET   (20 MG TOTAL ) BY MOUTH   AT BEDTIME   solifenacin 5 MG tablet Commonly known as:  VESICARE Take 1 tablet (5 mg total) by mouth daily.      Follow-up Information    Bing Neighbors, FNP. Schedule an appointment as soon as possible for a visit in 1 week(s).   Specialty:  Family Medicine Contact information: [address] [place] [address] [phone]          No Known Allergies  Consultations: None  Procedures/Studies: Dg Chest 2 View  Result Date: 03/17/2017 CLINICAL DATA:  Cough, wheezing, chills, shortness of Breath EXAM: CHEST  2 VIEW COMPARISON:  02/24/2017 FINDINGS: Airspace opacity noted in the right upper lobe compatible with pneumonia. Left lung clear. Heart is upper limits normal in size. No effusions or acute bony abnormality. IMPRESSION: Right upper lobe pneumonia. Electronically Signed   By: Charlett Nose M.D.   On: 03/17/2017 11:51   Dg Chest 2 View  Result Date: 02/24/2017 CLINICAL DATA:  Chest pain X 4 days; EXAM: CHEST  2 VIEW COMPARISON:  09/29/2016 FINDINGS: Normal mediastinum and cardiac silhouette. Normal pulmonary vasculature. No evidence of effusion, infiltrate, or pneumothorax. No acute bony abnormality. Degenerative osteophytosis of the spine. IMPRESSION: No acute cardiopulmonary process. Electronically Signed   By: Loura Halt.D.  On: 02/24/2017 18:24    (Echo, Carotid, EGD, Colonoscopy, ERCP)    Subjective: Patient seen and examined the bedside this morning.  Remains comfortable.  Her respiratory status is stable.  Stable for discharge to home today.  Discharge Exam: Vitals:   03/19/17 2030 03/20/17 0509  BP: 120/75 (!) 122/59  Pulse: (!) 55 (!) 51  Resp: 16 16  Temp: 98.4 F (36.9 C) 98.7 F (37.1 C)  SpO2: 98% 91%   Vitals:   03/19/17 1441 03/19/17 1930 03/19/17 2030 03/20/17 0509  BP:   120/75 (!) 122/59  Pulse:   (!) 55 (!) 51  Resp:   16 16  Temp:   98.4 F (36.9 C) 98.7 F (37.1 C)  TempSrc:   Oral Oral  SpO2: 97% 98% 98% 91%  Weight:      Height:        General: Pt is alert, awake, not in acute distress Cardiovascular: RRR, S1/S2 +, no rubs, no gallops Respiratory: CTA bilaterally, no  wheezing, no rhonchi Abdominal: Soft, NT, ND, bowel sounds + Extremities: no edema, no cyanosis    The results of significant diagnostics from this hospitalization (including imaging, microbiology, ancillary and laboratory) are listed below for reference.     Microbiology: Recent Results (from the past 240 hour(s))  Rapid strep screen     Status: None   Collection Time: 03/17/17 11:32 AM  Result Value Ref Range Status   Streptococcus, Group A Screen (Direct) NEGATIVE NEGATIVE Final    Comment: (NOTE) A Rapid Antigen test may result negative if the antigen level in the sample is below the detection level of this test. The FDA has not cleared this test as a stand-alone test therefore the rapid antigen negative result has reflexed to a Group A Strep culture. Performed at [facility], [address], [place], [place] [address]   Blood culture (routine x 2)     Status: None (Preliminary result)   Collection Time: 03/17/17 12:49 PM  Result Value Ref Range Status   Specimen Description   Final    BLOOD RIGHT FOREARM Performed at [facility], [address], [place], [place] [address]    Special Requests   Final    BOTTLES DRAWN AEROBIC AND ANAEROBIC Blood Culture adequate volume Performed at [facility], [address], [place], [place] [address]    Culture   Final    NO GROWTH 1 DAY Performed at [facility], [address] [place], [place] [address]    Report Status PENDING  Incomplete  Blood culture (routine x 2)     Status: None (Preliminary result)   Collection Time: 03/17/17  1:12 PM  Result Value Ref Range Status   Specimen Description   Final    BLOOD RIGHT FOREARM Performed at [facility], [address], [place], [place] [address]    Special Requests   Final    BOTTLES DRAWN AEROBIC AND ANAEROBIC Blood Culture adequate volume Performed at [facility], [address] [place], [place] [address]    Culture   Final    NO GROWTH 1 DAY Performed at [facility], [address] [place], [place] [address]    Report Status PENDING  Incomplete  Respiratory Panel by PCR     Status: None   Collection Time: 03/17/17  3:44 PM  Result Value Ref Range Status   Adenovirus NOT DETECTED NOT DETECTED Final  Coronavirus 229E NOT DETECTED NOT DETECTED Final   Coronavirus HKU1 NOT DETECTED NOT DETECTED Final   Coronavirus NL63 NOT DETECTED NOT DETECTED Final   Coronavirus OC43 NOT DETECTED NOT DETECTED Final   Metapneumovirus NOT DETECTED NOT DETECTED Final   Rhinovirus / Enterovirus NOT DETECTED NOT DETECTED Final   Influenza A NOT DETECTED NOT DETECTED Final   Influenza A H1 NOT DETECTED NOT DETECTED Final   Influenza A H1 2009 NOT DETECTED NOT DETECTED Final   Influenza A H3 NOT DETECTED NOT DETECTED Final   Influenza B NOT DETECTED NOT DETECTED Final   Parainfluenza Virus 1 NOT DETECTED NOT DETECTED Final   Parainfluenza Virus 2 NOT DETECTED NOT DETECTED Final   Parainfluenza Virus 3 NOT DETECTED NOT DETECTED Final   Parainfluenza Virus 4 NOT DETECTED NOT DETECTED Final   Respiratory Syncytial Virus NOT DETECTED NOT DETECTED Final   Bordetella pertussis NOT DETECTED NOT DETECTED Final   Chlamydophila pneumoniae NOT DETECTED NOT DETECTED Final   Mycoplasma pneumoniae NOT DETECTED NOT DETECTED Final    Comment: Performed at [facility], [address] [place], [place] [address]  Culture, expectorated sputum-assessment     Status: None   Collection Time: 03/18/17  7:23 AM  Result Value Ref Range Status   Specimen Description SPUTUM  Final   Special Requests NONE  Final   Sputum evaluation   Final    THIS SPECIMEN IS ACCEPTABLE FOR SPUTUM CULTURE Performed at [facility], [address] [place], [place] [address]    Report Status 03/18/2017 FINAL  Final  Culture, respiratory (NON-Expectorated)     Status: None  (Preliminary result)   Collection Time: 03/18/17  7:23 AM  Result Value Ref Range Status   Specimen Description   Final    SPUTUM Performed at [facility], [address] [place], [place] [address]    Special Requests   Final    NONE Reflexed from [id number] Performed at [facility], [address] [place], [place] [address]    Gram Stain   Final    MODERATE WBC PRESENT,BOTH PMN AND MONONUCLEAR RARE GRAM POSITIVE COCCI RARE GRAM VARIABLE ROD    Culture   Final    CULTURE REINCUBATED FOR BETTER GROWTH Performed at [facility], [address] [place], [place] [address]    Report Status PENDING  Incomplete     Labs: BNP (last 3 results) No results for input(s): BNP in the last 8760 hours. Basic Metabolic Panel: Recent Labs  Lab 03/17/17 1248 03/18/17 0402 03/20/17 0321  NA 136 132* 136  K 4.3 4.5 3.8  CL 99* 97* 97*  CO2 29 25 28   GLUCOSE 167* 252* 141*  BUN 6 12 8   CREATININE 0.77 0.81 0.49  CALCIUM 9.3 9.1 9.1  MG 2.0  --   --    Liver Function Tests: Recent Labs  Lab 03/17/17 1248 03/18/17 0402  AST 23 26  ALT 18 18  ALKPHOS 82 72  BILITOT 0.5 0.2*  PROT 8.0 7.4  ALBUMIN 3.9 3.5   No results for input(s): LIPASE, AMYLASE in the last 168 hours. No results for input(s): AMMONIA in the last 168 hours. CBC: Recent Labs  Lab 03/17/17 1248 03/18/17 0952 03/19/17 0359 03/20/17 0321  WBC 14.6* 12.6* 14.1* 10.5  NEUTROABS 10.3* 10.2* 9.4* 8.4*  HGB 13.8 12.3 12.5 9.0*  HCT 42.5 38.1 39.5 28.6*  MCV 82.0 81.9 82.5 89.9  PLT 299 295  328 298   Cardiac Enzymes: No results for input(s): CKTOTAL, CKMB, CKMBINDEX, TROPONINI in the last 168 hours. BNP: Invalid input(s): POCBNP CBG: Recent Labs  Lab 03/18/17 2138  GLUCAP 194*   D-Dimer No results for input(s): DDIMER in the last 72 hours. Hgb A1c Recent Labs    03/20/17 0321  HGBA1C 5.4   Lipid Profile No results for input(s): CHOL, HDL, LDLCALC,  TRIG, CHOLHDL, LDLDIRECT in the last 72 hours. Thyroid function studies No results for input(s): TSH, T4TOTAL, T3FREE, THYROIDAB in the last 72 hours.  Invalid input(s): FREET3 Anemia work up No results for input(s): VITAMINB12, FOLATE, FERRITIN, TIBC, IRON, RETICCTPCT in the last 72 hours. Urinalysis    Component Value Date/Time   COLORURINE YELLOW 11/15/2016 0020   APPEARANCEUR HAZY (A) 11/15/2016 0020   LABSPEC 1.020 11/20/2016 1401   PHURINE 5.5 11/20/2016 1401   GLUCOSEU NEGATIVE 11/20/2016 1401   HGBUR MODERATE (A) 11/20/2016 1401   BILIRUBINUR NEGATIVE 11/20/2016 1401   KETONESUR NEGATIVE 11/20/2016 1401   PROTEINUR 30 (A) 11/20/2016 1401   UROBILINOGEN 1.0 11/20/2016 1401   NITRITE NEGATIVE 11/20/2016 1401   LEUKOCYTESUR NEGATIVE 11/20/2016 1401   Sepsis Labs Invalid input(s): PROCALCITONIN,  WBC,  LACTICIDVEN Microbiology Recent Results (from the past 240 hour(s))  Rapid strep screen     Status: None   Collection Time: 03/17/17 11:32 AM  Result Value Ref Range Status   Streptococcus, Group A Screen (Direct) NEGATIVE NEGATIVE Final    Comment: (NOTE) A Rapid Antigen test may result negative if the antigen level in the sample is below the detection level of this test. The FDA has not cleared this test as a stand-alone test therefore the rapid antigen negative result has reflexed to a Group A Strep culture. Performed at [facility], [address] [place], [place] [address]   Blood culture (routine x 2)     Status: None (Preliminary result)   Collection Time: 03/17/17 12:49 PM  Result Value Ref Range Status   Specimen Description   Final    BLOOD RIGHT FOREARM Performed at [facility], [address] [place], [place] [address]    Special Requests   Final    BOTTLES DRAWN AEROBIC AND ANAEROBIC Blood Culture adequate volume Performed at [facility], [address], [place], [place] [address]    Culture    Final    NO GROWTH 1 DAY Performed at [facility], [address], [place], [place] [address]    Report Status PENDING  Incomplete  Blood culture (routine x 2)     Status: None (Preliminary result)   Collection Time: 03/17/17  1:12 PM  Result Value Ref Range Status   Specimen Description   Final    BLOOD RIGHT FOREARM Performed at [facility], [address] [place], [place] [address]    Special Requests   Final    BOTTLES DRAWN AEROBIC AND ANAEROBIC Blood Culture adequate volume Performed at [facility], [address] [place], [place] [address]    Culture   Final    NO GROWTH 1 DAY Performed at [facility], [address], [place], [place] [address]    Report Status PENDING  Incomplete  Respiratory Panel by PCR     Status: None   Collection Time: 03/17/17  3:44 PM  Result Value Ref Range Status   Adenovirus NOT DETECTED NOT DETECTED Final   Coronavirus 229E NOT DETECTED NOT  DETECTED Final   Coronavirus HKU1 NOT DETECTED NOT DETECTED Final   Coronavirus NL63 NOT DETECTED NOT DETECTED Final   Coronavirus OC43 NOT DETECTED NOT DETECTED Final   Metapneumovirus NOT DETECTED NOT DETECTED Final   Rhinovirus / Enterovirus NOT DETECTED NOT DETECTED Final   Influenza A NOT DETECTED NOT DETECTED Final   Influenza A H1 NOT DETECTED NOT DETECTED Final   Influenza A H1 2009 NOT DETECTED NOT DETECTED Final   Influenza A H3 NOT DETECTED NOT DETECTED Final   Influenza B NOT DETECTED NOT DETECTED Final   Parainfluenza Virus 1 NOT DETECTED NOT DETECTED Final   Parainfluenza Virus 2 NOT DETECTED NOT DETECTED Final   Parainfluenza Virus 3 NOT DETECTED NOT DETECTED Final   Parainfluenza Virus 4 NOT DETECTED NOT DETECTED Final   Respiratory Syncytial Virus NOT DETECTED NOT DETECTED Final   Bordetella pertussis NOT DETECTED NOT DETECTED Final   Chlamydophila pneumoniae NOT DETECTED NOT DETECTED Final   Mycoplasma pneumoniae NOT DETECTED  NOT DETECTED Final    Comment: Performed at [facility], [address] [place], [place] [address]  Culture, expectorated sputum-assessment     Status: None   Collection Time: 03/18/17  7:23 AM  Result Value Ref Range Status   Specimen Description SPUTUM  Final   Special Requests NONE  Final   Sputum evaluation   Final    THIS SPECIMEN IS ACCEPTABLE FOR SPUTUM CULTURE Performed at [facility], [address], [place], [place] [address]    Report Status 03/18/2017 FINAL  Final  Culture, respiratory (NON-Expectorated)     Status: None (Preliminary result)   Collection Time: 03/18/17  7:23 AM  Result Value Ref Range Status   Specimen Description   Final    SPUTUM Performed at [facility], [address], [place], [place] [address]    Special Requests   Final    NONE Reflexed from [id number] Performed at [facility], [address] [place], [place] [address]    Gram Stain   Final    MODERATE WBC PRESENT,BOTH PMN AND MONONUCLEAR RARE GRAM POSITIVE COCCI RARE GRAM VARIABLE ROD    Culture   Final    CULTURE REINCUBATED FOR BETTER GROWTH Performed at [facility], [address] [place], [place] [address]    Report Status PENDING  Incomplete     Time coordinating discharge: Over 30 minutes  SIGNED:   Meredith Leeds, MD  [facility] 03/20/2017, 8:56 AM Pager [phone]  If 7PM-7AM, please contact night-coverage [email or web address] Password [email or web address]

## 2017-03-23 LAB — CULTURE, BLOOD (ROUTINE X 2)
CULTURE: NO GROWTH
CULTURE: NO GROWTH
Special Requests: ADEQUATE
Special Requests: ADEQUATE

## 2017-04-01 ENCOUNTER — Ambulatory Visit: Payer: Self-pay | Admitting: Family Medicine

## 2017-04-06 ENCOUNTER — Other Ambulatory Visit: Payer: Self-pay | Admitting: Family Medicine

## 2017-04-23 ENCOUNTER — Encounter: Payer: Self-pay | Admitting: Family Medicine

## 2017-04-23 ENCOUNTER — Ambulatory Visit (INDEPENDENT_AMBULATORY_CARE_PROVIDER_SITE_OTHER): Payer: Medicaid Other | Admitting: Family Medicine

## 2017-04-23 VITALS — BP 114/76 | HR 65 | Temp 98.4°F | Resp 16 | Ht 63.0 in | Wt 248.0 lb

## 2017-04-23 DIAGNOSIS — I10 Essential (primary) hypertension: Secondary | ICD-10-CM

## 2017-04-23 DIAGNOSIS — R05 Cough: Secondary | ICD-10-CM

## 2017-04-23 DIAGNOSIS — J181 Lobar pneumonia, unspecified organism: Secondary | ICD-10-CM | POA: Diagnosis not present

## 2017-04-23 DIAGNOSIS — R059 Cough, unspecified: Secondary | ICD-10-CM

## 2017-04-23 DIAGNOSIS — J189 Pneumonia, unspecified organism: Secondary | ICD-10-CM

## 2017-04-23 DIAGNOSIS — D649 Anemia, unspecified: Secondary | ICD-10-CM | POA: Diagnosis not present

## 2017-04-23 LAB — POCT URINALYSIS DIP (DEVICE)
Bilirubin Urine: NEGATIVE
GLUCOSE, UA: NEGATIVE mg/dL
KETONES UR: NEGATIVE mg/dL
Leukocytes, UA: NEGATIVE
NITRITE: NEGATIVE
PH: 6 (ref 5.0–8.0)
PROTEIN: NEGATIVE mg/dL
Specific Gravity, Urine: 1.01 (ref 1.005–1.030)
Urobilinogen, UA: 0.2 mg/dL (ref 0.0–1.0)

## 2017-04-23 MED ORDER — ALBUTEROL SULFATE HFA 108 (90 BASE) MCG/ACT IN AERS: 2.0000 | INHALATION_SPRAY | Freq: Four times a day (QID) | RESPIRATORY_TRACT | 3 refills | Status: DC | PRN

## 2017-04-23 MED ORDER — PROMETHAZINE-CODEINE 6.25-10 MG/5ML PO SYRP
5.0000 mL | ORAL_SOLUTION | Freq: Four times a day (QID) | ORAL | 0 refills | Status: DC | PRN
Start: 2017-04-23 — End: 2017-05-20

## 2017-04-23 MED ORDER — METOPROLOL SUCCINATE ER 50 MG PO TB24: 100.0000 mg | ORAL_TABLET | Freq: Every day | ORAL | 2 refills | Status: DC

## 2017-04-23 NOTE — Patient Instructions (Addendum)
Go into the main hospital to obtain chest x-ray. I have prescribed you phenergan-codeine 5 ml every 6 hours. Use your albuterol in haler every 4-6 hours as needed for shortness of breath or cough. I will notify you of the results of your chest x-ray.    Arthritis Arthritis means joint pain. It can also mean joint disease. A joint is a place where bones come together. People who have arthritis may have:  Red joints.  Swollen joints.  Stiff joints.  Warm joints.  A fever.  A feeling of being sick.  Follow these instructions at home: Pay attention to any changes in your symptoms. Take these actions to help with your pain and swelling. Medicines  Take over-the-counter and prescription medicines only as told by your doctor.  Do not take aspirin for pain if your doctor says that you may have gout. Activity  Rest your joint if your doctor tells you to.  Avoid activities that make the pain worse.  Exercise your joint regularly as told by your doctor. Try doing exercises like: ? Swimming. ? Water aerobics. ? Biking. ? Walking. Joint Care   If your joint is swollen, keep it raised (elevated) if told by your doctor.  If your joint feels stiff in the morning, try taking a warm shower.  If you have diabetes, do not apply heat without asking your doctor.  If told, apply heat to the joint: ? Put a towel between the joint and the hot pack or heating pad. ? Leave the heat on the area for 20-30 minutes.  If told, apply ice to the joint: ? Put ice in a plastic bag. ? Place a towel between your skin and the bag. ? Leave the ice on for 20 minutes, 2-3 times per day.  Keep all follow-up visits as told by your doctor. Contact a doctor if:  The pain gets worse.  You have a fever. Get help right away if:  You have very bad pain in your joint.  You have swelling in your joint.  Your joint is red.  Many joints become painful and swollen.  You have very bad back  pain.  Your leg is very weak.  You cannot control your pee (urine) or poop (stool). This information is not intended to replace advice given to you by your health care provider. Make sure you discuss any questions you have with your health care provider. Document Released: 04/18/2009 Document Revised: 06/30/2015 Document Reviewed: 04/19/2014 Elsevier Interactive Patient Education  2018 [employer]     Cough, Adult Coughing is a reflex that clears your throat and your airways. Coughing helps to heal and protect your lungs. It is normal to cough occasionally, but a cough that happens with other symptoms or lasts a long time may be a sign of a condition that needs treatment. A cough may last only 2-3 weeks (acute), or it may last longer than 8 weeks (chronic). What are the causes? Coughing is commonly caused by:  Breathing in substances that irritate your lungs.  A viral or bacterial respiratory infection.  Allergies.  Asthma.  Postnasal drip.  Smoking.  Acid backing up from the stomach into the esophagus (gastroesophageal reflux).  Certain medicines.  Chronic lung problems, including COPD (or rarely, lung cancer).  Other medical conditions such as heart failure.  Follow these instructions at home: Pay attention to any changes in your symptoms. Take these actions to help with your discomfort:  Take medicines only as told by your health  care provider. ? If you were prescribed an antibiotic medicine, take it as told by your health care provider. Do not stop taking the antibiotic even if you start to feel better. ? Talk with your health care provider before you take a cough suppressant medicine.  Drink enough fluid to keep your urine clear or pale yellow.  If the air is dry, use a cold steam vaporizer or humidifier in your bedroom or your home to help loosen secretions.  Avoid anything that causes you to cough at work or at home.  If your cough is worse at night, try  sleeping in a semi-upright position.  Avoid cigarette smoke. If you smoke, quit smoking. If you need help quitting, ask your health care provider.  Avoid caffeine.  Avoid alcohol.  Rest as needed.  Contact a health care provider if:  You have new symptoms.  You cough up pus.  Your cough does not get better after 2-3 weeks, or your cough gets worse.  You cannot control your cough with suppressant medicines and you are losing sleep.  You develop pain that is getting worse or pain that is not controlled with pain medicines.  You have a fever.  You have unexplained weight loss.  You have night sweats. Get help right away if:  You cough up blood.  You have difficulty breathing.  Your heartbeat is very fast. This information is not intended to replace advice given to you by your health care provider. Make sure you discuss any questions you have with your health care provider. Document Released: 07/21/2010 Document Revised: 06/30/2015 Document Reviewed: 03/31/2014 Elsevier Interactive Patient Education  [employer].

## 2017-04-23 NOTE — Progress Notes (Signed)
Patient ID: Kathleen Herrera, female    DOB: [birth year], 57 y.o.   MRN: [id number]  PCP: Bing Neighbors, FNP  Chief Complaint  Patient presents with  . Hospitalization Follow-up  . Cough  . Arthritis    Subjective:  HPI Kathleen Herrera is a 57 y.o. female with morbid obesity, chronic smoking, COPD, Acid Reflux, presents for hospital follow-up s/p pneumonia and arthritis. She complains of a persistent cough since pneumonia diagnosis. During hospitalization, she was treated with IV antibiotics and discharged on oral Azithromycin. Her cough was treated with benzonatate, although she reports no significant relief with medication. She has not attempted any other medication for cough. She has experienced chills for about 3 days, although negative for fever or body aches, shortness of breath, or wheezing. She has received influenza vaccination.  She also complains of morning stiffness with aches localized to her back and extremities upon awakening. Stiffness improves with activity. She has attempted with tylenol without significant improvement. Denies any warmth of joints or swelling at present. Social History   Socioeconomic History  . Marital status: Single    Spouse name: Not on file  . Number of children: Not on file  . Years of education: Not on file  . Highest education level: Not on file  Social Needs  . Financial resource strain: Not on file  . Food insecurity - worry: Not on file  . Food insecurity - inability: Not on file  . Transportation needs - medical: Not on file  . Transportation needs - non-medical: Not on file  Occupational History  . Not on file  Tobacco Use  . Smoking status: Current Every Day Smoker    Packs/day: 0.25    Years: 37.00    Pack years: 9.25    Types: Cigarettes  . Smokeless tobacco: Never Used  Substance and Sexual Activity  . Alcohol use: Yes    Comment: occasionally  . Drug use: No    Comment: recovering crack addict  . Sexual activity: Not  Currently  Other Topics Concern  . Not on file  Social History Narrative  . Not on file    Family History  Problem Relation Age of Onset  . Dementia Mother   . Prostate cancer Father   . Dementia Father   . Prostate cancer Other   . CAD Other    Review of Systems Pertinent negatives included in HPI  Patient Active Problem List   Diagnosis Date Noted  . Bipolar 1 disorder, depressed, severe (HCC) 02/25/2017  . 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 Episodic recurrent vertigo   . Dermatitis   . Asthma   . CAP (community acquired pneumonia) 09/19/2015  . Dyspnea 09/19/2015  . Acute respiratory failure with hypoxia (HCC) 09/19/2015  . Abnormality of gait 06/29/2014  . Essential hypertension 06/29/2014  . Hyperlipidemia 06/29/2014  . Metabolic syndrome 06/29/2014  . Neuropathic pain 06/29/2014  . Gastroesophageal reflux disease without esophagitis 06/29/2014  . Tachycardia 06/29/2014  . At risk for osteopenia 06/29/2014  . Excessive daytime sleepiness 06/29/2014  . Fibromyalgia   . Abnormal LFTs 03/06/2013  . Extreme obesity 12/18/2012  . Fatty infiltration of liver 07/15/2012  . Gonalgia 07/15/2012  . LBP (low back pain) 07/15/2012  . Arthritis, degenerative 06/12/2012  . Arthritis 05/26/2012  . Bipolar 2 disorder (HCC) 04/17/2012  . Clinical depression 04/17/2012    No Known Allergies  Prior to Admission medications   Medication Sig Start Date End Date Taking? Authorizing Provider  albuterol (PROVENTIL HFA;VENTOLIN  HFA) 108 (90 Base) MCG/ACT inhaler Inhale 2 puffs into the lungs every 6 (six) hours as needed for wheezing or shortness of breath. 09/23/15  Yes Dorothea Ogle, MD  amLODipine (NORVASC) 10 MG tablet Take 1 tablet (10 mg total) by mouth daily. 10/16/16  Yes Bing Neighbors, FNP  aspirin 325 MG tablet Take 1 tablet (325 mg total) by mouth daily. 11/20/16  Yes Bing Neighbors, FNP  furosemide (LASIX) 20 MG tablet Take 1 tablet (20 mg total) by mouth daily. 11/20/16  Yes  Bing Neighbors, FNP  gabapentin (NEURONTIN) 300 MG capsule TAKE 1 CAPSULE (300 MG TOTAL) BY MOUTH THREE TIMES DAILY. 01/25/17  Yes Bing Neighbors, FNP  hydrOXYzine (ATARAX/VISTARIL) 25 MG tablet Take 1 tablet (25 mg total) by mouth 3 (three) times daily as needed for itching or anxiety. 03/01/17  Yes Money, Gerlene Burdock, FNP  lamoTRIgine (LAMICTAL) 25 MG tablet Take 1 tablet (25 mg total) by mouth 2 (two) times daily. For mood control Patient taking differently: Take 25 mg by mouth 3 (three) times daily. For mood control 03/01/17  Yes Money, Gerlene Burdock, FNP  lithium carbonate 300 MG capsule Take 1 capsule (300 mg total) by mouth every morning. For mood control 03/02/17  Yes Money, Gerlene Burdock, FNP  lithium carbonate 600 MG capsule Take 1 capsule (600 mg total) by mouth at bedtime. For mood control 03/01/17  Yes Money, Gerlene Burdock, FNP  meclizine (ANTIVERT) 12.5 MG tablet TAKE 1 TABLET (12.5 MG TOTAL) BY MOUTH TWO TIMES DAILY AS NEEDED FOR DIZZINESS. Patient taking differently: Take 12.5 mg by mouth every morning. TAKE 1 TABLET (12.5 MG TOTAL) BY MOUTH TWO TIMES DAILY AS NEEDED FOR DIZZINESS. 01/10/17  Yes Bing Neighbors, FNP  metoprolol succinate (TOPROL-XL) 50 MG 24 hr tablet Take 2 tablets (100 mg total) by mouth daily. Take with or immediately following a meal. 10/16/16  Yes Bing Neighbors, FNP  nystatin (MYCOSTATIN/NYSTOP) powder APPLY TWO SPRINKLES OF POWDER TO THE AFFECTED INNER THIGHS TO PREVENT ITCHING AND FUNGAL RASH 11/12/16  Yes Bing Neighbors, FNP  pantoprazole (PROTONIX) 40 MG tablet Take 1 tablet (40 mg total) by mouth 2 (two) times daily before a meal. 10/16/16  Yes Bing Neighbors, FNP  PARoxetine (PAXIL) 30 MG tablet Take 30 mg by mouth every morning.   Yes [provider]  simvastatin (ZOCOR) 20 MG tablet TAKE ONE TABLET   (20 MG TOTAL ) BY MOUTH   AT BEDTIME 11/20/16  Yes Bing Neighbors, FNP  solifenacin (VESICARE) 5 MG tablet Take 1 tablet (5 mg total) by mouth  daily. 11/20/16 11/20/17 Yes Bing Neighbors, FNP  SYMBICORT 80-4.5 MCG/ACT inhaler INHALE TWO PUFFS INTO THE LUNGS TWO TIMES DAILY. 04/08/17  Yes Bing Neighbors, FNP  azithromycin (ZITHROMAX) 500 MG tablet Take one tablet once a day Patient not taking: Reported on 04/23/2017 03/20/17   Meredith Leeds, MD  benzonatate (TESSALON) 200 MG capsule Take 1 capsule (200 mg total) by mouth 3 (three) times daily. Patient not taking: Reported on 04/23/2017 03/20/17   Meredith Leeds, MD  beta carotene 30 MG capsule Take 30 mg by mouth daily.    [provider]  betamethasone dipropionate (DIPROLENE) 0.05 % ointment Apply topically 2 (two) times daily. Patient not taking: Reported on 04/23/2017 12/05/16   Bing Neighbors, FNP    Past Medical, Surgical Family and Social History reviewed and updated.    Objective:   Today's Vitals  04/23/17 1346  BP: 114/76  Pulse: 65  Resp: 16  Temp: 98.4 F (36.9 C)  TempSrc: Oral  SpO2: 99%  Weight: 248 lb (112.5 kg)  Height: 5\' 3"  (1.6 m)    Wt Readings from Last 3 Encounters:  04/23/17 248 lb (112.5 kg)  03/18/17 246 lb 14.6 oz (112 kg)  11/20/16 240 lb (108.9 kg)    Physical Exam Constitutional: Patient appears well-developed and well-nourished. No distress. HENT: Normocephalic, atraumatic, External right and left ear normal. Oropharynx is clear and moist. Eyes: Conjunctivae and EOM are normal. PERRLA, no scleral icterus. Neck: Normal ROM. Neck supple. No JVD. No tracheal deviation. No thyromegaly. CVS: RRR, S1/S2 +, no murmurs, no gallops, no carotid bruit.  Pulmonary: Effort and breath sounds normal, no stridor, mild rhonchus cough notes during exam. Lung sounds clear. Abdominal: Soft. BS +, no distension, tenderness, rebound or guarding.  Musculoskeletal: Normal range of motion. No edema and no tenderness.  Neuro: Alert. Normal reflexes, muscle tone coordination. No cranial nerve deficit. Skin: Skin is warm and dry. No  rash noted. Not diaphoretic. No erythema. No pallor. Psychiatric: Normal mood and affect. Behavior, judgment, thought content normal. Assessment & Plan:  1. Community acquired pneumonia of right upper lobe of lung (HCC), obtain a repeat chest x-ray to confirm resolution of infection as patient continues to experience a cough. Asymptomatic of any other symptom.  Treating cough symptomatically. If imaging show persistent PNA will resume antibiotic therapy.  2. Essential hypertension, well-controlled. We have discussed target BP range and blood pressure goal. I have advised patient to check BP regularly and to call us back or report to clinic if the numbers are consistently higher than 140/90. We discussed the importance of compliance with medical therapy and DASH diet recommended, consequences of uncontrolled hypertension discussed. Continue current BP medications. Checking a BMP.  3. Anemia, unspecified type, checking CBC with Differential  4. Cough, trial phenergan with codeine 5 ml every 6 hour as needed for cough. If symptoms worsen or do not improve return for follow-up.    RTC: 6 months for chronic condition follow-up   Godfrey PickKimberly S. Tiburcio PeaHarris, MSN, FNP-C [facility]  [address] [place], [place] [address] [phone]

## 2017-04-24 ENCOUNTER — Telehealth: Payer: Self-pay | Admitting: Family Medicine

## 2017-04-24 LAB — CBC WITH DIFFERENTIAL/PLATELET
Basophils Absolute: 0 10*3/uL (ref 0.0–0.2)
Basos: 0 %
EOS (ABSOLUTE): 0.1 10*3/uL (ref 0.0–0.4)
EOS: 2 %
HEMATOCRIT: 44.5 % (ref 34.0–46.6)
Hemoglobin: 13.6 g/dL (ref 11.1–15.9)
IMMATURE GRANS (ABS): 0 10*3/uL (ref 0.0–0.1)
Immature Granulocytes: 0 %
LYMPHS: 29 %
Lymphocytes Absolute: 2 10*3/uL (ref 0.7–3.1)
MCH: 25.8 pg — ABNORMAL LOW (ref 26.6–33.0)
MCHC: 30.6 g/dL — ABNORMAL LOW (ref 31.5–35.7)
MCV: 84 fL (ref 79–97)
MONOS ABS: 0.4 10*3/uL (ref 0.1–0.9)
Monocytes: 6 %
NEUTROS PCT: 63 %
Neutrophils Absolute: 4.4 10*3/uL (ref 1.4–7.0)
PLATELETS: 345 10*3/uL (ref 150–379)
RBC: 5.28 x10E6/uL (ref 3.77–5.28)
RDW: 15.8 % — AB (ref 12.3–15.4)
WBC: 6.9 10*3/uL (ref 3.4–10.8)

## 2017-04-24 LAB — BASIC METABOLIC PANEL
BUN/Creatinine Ratio: 9 (ref 9–23)
BUN: 8 mg/dL (ref 6–24)
CHLORIDE: 105 mmol/L (ref 96–106)
CO2: 26 mmol/L (ref 20–29)
Calcium: 9.8 mg/dL (ref 8.7–10.2)
Creatinine, Ser: 0.88 mg/dL (ref 0.57–1.00)
GFR calc Af Amer: 84 mL/min/{1.73_m2} (ref >59)
GFR, EST NON AFRICAN AMERICAN: 73 mL/min/{1.73_m2} (ref >59)
Glucose: 80 mg/dL (ref 65–99)
POTASSIUM: 4.3 mmol/L (ref 3.5–5.2)
Sodium: 144 mmol/L (ref 134–144)

## 2017-04-24 MED ORDER — FERROUS SULFATE 325 (65 FE) MG PO TABS: 325.0000 mg | ORAL_TABLET | Freq: Every day | ORAL | 3 refills | Status: DC

## 2017-04-24 NOTE — Telephone Encounter (Signed)
Contact patient to advise hemoglobin is normal. She continues to show signs anemia and I recommend a daily iron tablet to maintain iron stores. I will send a order to the pharmacy.  Godfrey PickKimberly S. Tiburcio PeaHarris, MSN, FNP-C The Patient Care [facility]  [address] [place], [place] [address] [phone]

## 2017-04-25 ENCOUNTER — Other Ambulatory Visit: Payer: Self-pay | Admitting: Family Medicine

## 2017-04-25 ENCOUNTER — Telehealth: Payer: Self-pay

## 2017-04-25 DIAGNOSIS — I10 Essential (primary) hypertension: Secondary | ICD-10-CM

## 2017-04-25 MED ORDER — NYSTATIN 100000 UNIT/GM EX POWD: CUTANEOUS | 2 refills | Status: DC

## 2017-04-25 MED ORDER — TRAMADOL-ACETAMINOPHEN 37.5-325 MG PO TABS: 1.0000 | ORAL_TABLET | Freq: Four times a day (QID) | ORAL | 0 refills | Status: DC | PRN

## 2017-04-25 MED ORDER — CLOTRIMAZOLE-BETAMETHASONE 1-0.05 % EX CREA: 1.0000 "application " | TOPICAL_CREAM | Freq: Two times a day (BID) | CUTANEOUS | 2 refills | Status: DC

## 2017-04-25 NOTE — Progress Notes (Signed)
Notify patient that Diclofenac interacts with lithium therefore I am placing her on Ultracet which is a combination of tramadol and codeine for arthritis pain. Lotrisone and Nystatin have been sent to pharmacy for fungus.

## 2017-04-25 NOTE — Telephone Encounter (Signed)
Patient states that she was suppose to get a medication for arthritis and fungal cream sent to pharmacy.

## 2017-04-26 NOTE — Progress Notes (Signed)
Patient notified

## 2017-04-26 NOTE — Progress Notes (Signed)
Left a vm for patient to callback 

## 2017-05-09 ENCOUNTER — Other Ambulatory Visit: Payer: Self-pay | Admitting: Family Medicine

## 2017-05-20 ENCOUNTER — Ambulatory Visit (INDEPENDENT_AMBULATORY_CARE_PROVIDER_SITE_OTHER): Payer: Medicaid Other | Admitting: Family Medicine

## 2017-05-20 ENCOUNTER — Ambulatory Visit (HOSPITAL_COMMUNITY)
Admission: RE | Admit: 2017-05-20 | Discharge: 2017-05-20 | Disposition: A | Discharge status: Home / Self Care | Payer: Medicaid Other | Source: Ambulatory Visit | Attending: Family Medicine | Admitting: Family Medicine

## 2017-05-20 ENCOUNTER — Encounter: Payer: Self-pay | Admitting: Family Medicine

## 2017-05-20 VITALS — BP 130/66 | HR 59 | Temp 98.6°F | Resp 16 | Ht 63.0 in | Wt 249.0 lb

## 2017-05-20 DIAGNOSIS — Z09 Encounter for follow-up examination after completed treatment for conditions other than malignant neoplasm: Secondary | ICD-10-CM | POA: Insufficient documentation

## 2017-05-20 DIAGNOSIS — J449 Chronic obstructive pulmonary disease, unspecified: Secondary | ICD-10-CM

## 2017-05-20 DIAGNOSIS — Z8701 Personal history of pneumonia (recurrent): Secondary | ICD-10-CM | POA: Diagnosis not present

## 2017-05-20 DIAGNOSIS — F17200 Nicotine dependence, unspecified, uncomplicated: Secondary | ICD-10-CM

## 2017-05-20 DIAGNOSIS — I10 Essential (primary) hypertension: Secondary | ICD-10-CM | POA: Diagnosis not present

## 2017-05-20 DIAGNOSIS — J189 Pneumonia, unspecified organism: Secondary | ICD-10-CM | POA: Diagnosis present

## 2017-05-20 DIAGNOSIS — Z1211 Encounter for screening for malignant neoplasm of colon: Secondary | ICD-10-CM

## 2017-05-20 DIAGNOSIS — F411 Generalized anxiety disorder: Secondary | ICD-10-CM | POA: Diagnosis not present

## 2017-05-20 DIAGNOSIS — Z6841 Body Mass Index (BMI) 40.0 and over, adult: Secondary | ICD-10-CM

## 2017-05-20 DIAGNOSIS — E785 Hyperlipidemia, unspecified: Secondary | ICD-10-CM

## 2017-05-20 DIAGNOSIS — J181 Lobar pneumonia, unspecified organism: Secondary | ICD-10-CM

## 2017-05-20 MED ORDER — HYDROXYZINE HCL 25 MG PO TABS: 25.0000 mg | ORAL_TABLET | Freq: Three times a day (TID) | ORAL | 1 refills | Status: DC | PRN

## 2017-05-20 NOTE — Patient Instructions (Addendum)
Will discontinue ferrous sulfate, most recent hemoglobin was 13.6.  Recommend continuing an iron rich diet.  Discussed food options at length.  Follow-up with behavioral health provider as scheduled.  Review chest x-ray, no acute abnormalities noted.  Pneumonia resolved.   Your blood pressure is at goal on current medication regimen, no medication changes warranted on today.   Recommend smoking cessation.

## 2017-05-26 DIAGNOSIS — J449 Chronic obstructive pulmonary disease, unspecified: Secondary | ICD-10-CM | POA: Insufficient documentation

## 2017-05-26 DIAGNOSIS — Z8701 Personal history of pneumonia (recurrent): Secondary | ICD-10-CM | POA: Insufficient documentation

## 2017-05-26 DIAGNOSIS — F411 Generalized anxiety disorder: Secondary | ICD-10-CM | POA: Insufficient documentation

## 2017-05-26 NOTE — Progress Notes (Signed)
Subjective:    Patient ID: Kathleen Herrera, female    DOB: 26-Feb-1960, 57 y.o.   MRN: [id number]  HPI Kathleen Herrera, a pleasant 57 year old female with a medical history significant for hypertension, asthma, bipolar depression, morbid obesity, COPD and tobacco dependence present for a 6 week follow up. Patient was recently admitted to inpatient services for community acquired pneumonia. Pneumonia was in right upper lobe per chest xray on 03/17/2017. Patient underwent a repeat chest xray this am per primary provider, which showed no acute pulmonary process. Patient currently endorses persistent cough and periodic shortness of breath with exertion. She continues to smoke a half pack of cigarettes per day. Smoking history is greater than 40 years. She has attempted to quit in the past without success. Patient has a diagnosis of COPD and has not been followed by pulmonology. She is on daily Symbicort 80-4.5 two puffs BID. She denies fever, heart palpitations, chest pain, or wheezing.  Kathleen Herrera also has a history of morbid obesity. She does not exercise routinely or follow a reduced calorie, balanced diet. Body mass index is 44.11 kg/m. She typically takes antihypertensive medications consistently and does not check blood pressure at home.   Patient has a history of bipolar depression. She is followed by behaviorial health. She endorses periodic anhedonia. Currently denies suicidal or homicidal intent. Symptoms are controlled on current medication regimen.   Past Medical History:  Diagnosis Date  . Arthritis   . Asthma   . 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 Bipolar disorder (HCC)   . Bronchitis   . Depression   . Fibromyalgia Y-2  . Fibromyalgia unknown  . Hyperlipemia   . Hypertension   . Post traumatic stress disorder   . Sinus congestion    Social History   Socioeconomic History  . Marital status: Single    Spouse name: Not on file  . Number of children: Not on file  . Years of education: Not  on file  . Highest education level: Not on file  Occupational History  . Not on file  Social Needs  . Financial resource strain: Not on file  . Food insecurity:    Worry: Not on file    Inability: Not on file  . Transportation needs:    Medical: Not on file    Non-medical: Not on file  Tobacco Use  . Smoking status: Current Every Day Smoker    Packs/day: 0.25    Years: 37.00    Pack years: 9.25    Types: Cigarettes  . Smokeless tobacco: Never Used  Substance and Sexual Activity  . Alcohol use: Yes    Comment: occasionally  . Drug use: No    Comment: recovering crack addict  . Sexual activity: Not Currently  Lifestyle  . Physical activity:    Days per week: Not on file    Minutes per session: Not on file  . Stress: Not on file  Relationships  . Social connections:    Talks on phone: Not on file    Gets together: Not on file    Attends religious service: Not on file    Active member of club or organization: Not on file    Attends meetings of clubs or organizations: Not on file    Relationship status: Not on file  . Intimate partner violence:    Fear of current or ex partner: Not on file    Emotionally abused: Not on file    Physically abused: Not on file  Forced sexual activity: Not on file  Other Topics Concern  . Not on file  Social History Narrative  . Not on file   Immunization History  Administered Date(s) Administered  . Influenza,inj,Quad PF,6+ Mos 03/12/2016, 10/16/2016  . Pneumococcal Polysaccharide-23 11/18/2015  . Tdap 02/05/2010, 08/27/2014   No Known Allergies  Review of Systems  Constitutional: Negative.   HENT: Negative.   Respiratory: Positive for cough and shortness of breath.   Cardiovascular: Negative.   Gastrointestinal: Negative.   Endocrine: Negative.   Genitourinary: Negative.   Musculoskeletal: Negative.   Skin: Negative.   Allergic/Immunologic: Negative.   Neurological: Negative.   Hematological: Negative.    Psychiatric/Behavioral: Negative.        Objective:   Physical Exam  BP 130/66 (BP Location: Left Arm, Patient Position: Sitting, Cuff Size: Large)   Pulse (!) 59   Temp 98.6 F (37 C) (Oral)   Resp 16   Ht 5\' 3"  (1.6 m)   Wt 249 lb (112.9 kg)   SpO2 99%   BMI 44.11 kg/m    General Appearance:    Alert, cooperative, no distress, appears stated age  Head:    Normocephalic, without obvious abnormality, atraumatic  Eyes:    PERRL, conjunctiva/corneas clear, EOM's intact, fundi    benign, both eyes  Ears:    Normal TM's and external ear canals, both ears  Nose:   Nares normal, septum midline, mucosa normal, no drainage    or sinus tenderness  Throat:   Lips, mucosa, and tongue normal; teeth and gums normal  Neck:   Supple, symmetrical, trachea midline, no adenopathy;    thyroid:  no enlargement/tenderness/nodules; no carotid   bruit or JVD  Back:     Symmetric, no curvature, ROM normal, no CVA tenderness  Lungs:     Clear to auscultation bilaterally, respirations unlabored  Chest Wall:    No tenderness or deformity   Heart:    Regular rate and rhythm, S1 and S2 normal, no murmur, rub   or gallop  Abdomen:     Soft, non-tender, bowel sounds active all four quadrants,    no masses, no organomegaly  Extremities:   Extremities normal, atraumatic, no cyanosis or edema  Pulses:   2+ and symmetric all extremities  Skin:   Skin color, texture, turgor normal, no rashes or lesions  Lymph nodes:   Cervical, supraclavicular, and axillary nodes normal  Neurologic:   CNII-XII intact, normal strength, sensation and reflexes    throughout         Assessment & Plan:  1. History of pneumonia Pneumonia resolved per chest xray this am. No further treatment or evaluation warranted.   2. Chronic obstructive pulmonary disease, unspecified COPD type (HCC) History of COPD, will send referral to pulmonology for further workup and evaluation of condition.  Recommend smoking cessation to lesson  symptoms associated with COPD.   3. Generalized anxiety disorder Continue to follow up with [facility] as scheduled - hydrOXYzine (ATARAX/VISTARIL) 25 MG tablet; Take 1 tablet (25 mg total) by mouth 3 (three) times daily as needed for itching or anxiety.  Dispense: 30 tablet; Refill: 1  4. Essential hypertension Blood pressure at goal, no medication changes warranted.  - Continue medication, monitor blood pressure at home. Continue DASH diet. Reminded to report  to  ER if any CP, SOB, nausea, dizziness, severe HA, changes vision/speech, left arm numbness and tingling and jaw pain.  5. Hyperlipidemia, unspecified hyperlipidemia type The 10-year ASCVD risk score Denman George DC  Montez HagemanJr., et al., 2013) is: 11.1%   Values used to calculate the score:     Age: [age] years     Sex: Female     Is Non-Hispanic African American: Yes     Diabetic: No     Tobacco smoker: Yes     Systolic Blood Pressure: 130 mmHg     Is BP treated: Yes     HDL Cholesterol: 64 mg/dL     Total Cholesterol: 204 mg/dL Repeat lipid panel in 3 months. Continue low dose statin therapy.   6. Colon cancer screening - Cologuard  7. Morbid obesity with BMI of 40.0-44.9, adult (HCC) Body mass index is 44.11 kg/m. Recommend a lowfat, low carbohydrate diet divided over 5-6 small meals, increase water intake to 6-8 glasses, and increase daily activity level.   8. Tobacco dependence Smoking cessation instruction/counseling given:  counseled patient on the dangers of tobacco use, advised patient to stop smoking, and reviewed strategies to maximize success  RTC: 3 months for chronic conditions with primary provider   Nolon NationsLachina Moore Pammie Chirino  MSN, FNP-C Patient Care [facility] [address]  [place], [place] [address] [phone]

## 2017-06-06 ENCOUNTER — Other Ambulatory Visit: Payer: Self-pay | Admitting: Family Medicine

## 2017-06-13 ENCOUNTER — Encounter: Payer: Self-pay | Admitting: Family Medicine

## 2017-06-13 ENCOUNTER — Ambulatory Visit (INDEPENDENT_AMBULATORY_CARE_PROVIDER_SITE_OTHER): Payer: Medicaid Other | Admitting: Family Medicine

## 2017-06-13 VITALS — BP 134/71 | HR 60 | Temp 98.8°F | Resp 16 | Ht 63.0 in | Wt 252.0 lb

## 2017-06-13 DIAGNOSIS — R21 Rash and other nonspecific skin eruption: Secondary | ICD-10-CM

## 2017-06-13 DIAGNOSIS — Z2089 Contact with and (suspected) exposure to other communicable diseases: Secondary | ICD-10-CM | POA: Diagnosis not present

## 2017-06-13 DIAGNOSIS — L299 Pruritus, unspecified: Secondary | ICD-10-CM | POA: Diagnosis not present

## 2017-06-13 DIAGNOSIS — Z207 Contact with and (suspected) exposure to pediculosis, acariasis and other infestations: Secondary | ICD-10-CM

## 2017-06-13 MED ORDER — HYDROXYZINE HCL 25 MG PO TABS: 25.0000 mg | ORAL_TABLET | Freq: Three times a day (TID) | ORAL | 1 refills | Status: DC | PRN

## 2017-06-13 MED ORDER — HYDROCORTISONE 2.5 % EX CREA: TOPICAL_CREAM | Freq: Two times a day (BID) | CUTANEOUS | 0 refills | Status: DC

## 2017-06-13 MED ORDER — PERMETHRIN 5 % EX CREA: 1.0000 "application " | TOPICAL_CREAM | Freq: Once | CUTANEOUS | 0 refills | Status: AC

## 2017-06-13 NOTE — Progress Notes (Signed)
Subjective:     Kathleen Herrera is a 57 y.o. female with multiple comorbidities that presents with a generalized rash.  Patient complains of a generalized itchy rash that has been present for greater than 2 weeks.  Patient states that itching has been worsening primarily at night.  She attributes rash to a bedbug infestation. She says that she is assuming that rash is from bed bug exposure.    Rashes characterized as red and itchy.  Patient has been using over-the-counter rubbing alcohol with minimal relief.  Patient does not have contacts with similar symptoms. Patient denies: arthralgia, congestion, cough, crankiness, decrease in energy level, fever, headache, irritability, myalgia, nausea, sore throat and vomiting. Patient has not had new exposures (soaps, lotions, laundry detergents, foods, medications, plants, insects or animals).   Past Medical History:  Diagnosis Date  . Arthritis   . Asthma   . 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 Bipolar disorder (HCC)   . Bronchitis   . Depression   . Fibromyalgia Y-2  . Fibromyalgia unknown  . Hyperlipemia   . Hypertension   . Post traumatic stress disorder   . Sinus congestion    Social History   Socioeconomic History  . Marital status: Single    Spouse name: Not on file  . Number of children: Not on file  . Years of education: Not on file  . Highest education level: Not on file  Occupational History  . Not on file  Social Needs  . Financial resource strain: Not on file  . Food insecurity:    Worry: Not on file    Inability: Not on file  . Transportation needs:    Medical: Not on file    Non-medical: Not on file  Tobacco Use  . Smoking status: Current Every Day Smoker    Packs/day: 0.25    Years: 37.00    Pack years: 9.25    Types: Cigarettes  . Smokeless tobacco: Never Used  Substance and Sexual Activity  . Alcohol use: Yes    Comment: occasionally  . Drug use: No    Comment: recovering crack addict  . Sexual activity: Not Currently   Lifestyle  . Physical activity:    Days per week: Not on file    Minutes per session: Not on file  . Stress: Not on file  Relationships  . Social connections:    Talks on phone: Not on file    Gets together: Not on file    Attends religious service: Not on file    Active member of club or organization: Not on file    Attends meetings of clubs or organizations: Not on file    Relationship status: Not on file  . Intimate partner violence:    Fear of current or ex partner: Not on file    Emotionally abused: Not on file    Physically abused: Not on file    Forced sexual activity: Not on file  Other Topics Concern  . Not on file  Social History Narrative  . Not on file   No Known Allergies Immunization History  Administered Date(s) Administered  . Influenza,inj,Quad PF,6+ Mos 03/12/2016, 10/16/2016  . Pneumococcal Polysaccharide-23 11/18/2015  . Tdap 02/05/2010, 08/27/2014    Objective:  Physical Exam  Cardiovascular: Normal rate, regular rhythm and normal heart sounds.  Pulmonary/Chest: Effort normal and breath sounds normal.  Abdominal: Soft. Bowel sounds are normal.  Skin: Rash noted. Rash is macular (generalized, erythematous, to webfolds).      Assessment:  scabies    Plan:  Scabies exposure Provided written information on application of permethrin.  - permethrin (ELIMITE) 5 % cream; Apply 1 application topically once for 1 dose.  Dispense: 60 g; Refill: 0 - hydrocortisone 2.5 % cream; Apply topically 2 (two) times daily.  Dispense: 30 g; Refill: 0  Pruritus  - hydrOXYzine (ATARAX/VISTARIL) 25 MG tablet; Take 1 tablet (25 mg total) by mouth 3 (three) times daily as needed for itching or anxiety.  Dispense: 30 tablet; Refill: 1  - hydrocortisone 2.5 % cream; Apply topically 2 (two) times daily.  Dispense: 30 g; Refill: 0  Generalized rash - hydrOXYzine (ATARAX/VISTARIL) 25 MG tablet; Take 1 tablet (25 mg total) by mouth 3 (three) times daily as needed for  itching or anxiety.  Dispense: 30 tablet; Refill: 1 - hydrocortisone 2.5 % cream; Apply topically 2 (two) times daily.  Dispense: 30 g; Refill: 0    The patient was given clear instructions to go to ER or return to medical center if symptoms do not improve, worsen or new problems develop. The patient verbalized understanding.     Nolon Nations  MSN, FNP-C Patient Care [facility] [address] [place], [place] [address] [phone]

## 2017-06-13 NOTE — Patient Instructions (Signed)
Bedbugs Bedbugs are tiny bugs that live in and around beds. During the day, they stay hidden. At night, they come out and bite. Where are bedbugs found? Bedbugs can be found anywhere. It does not matter if a place is clean or dirty. They are often found in:  Hotels.  Shelters.  Dorms.  Hospitals.  Nursing homes.  Places where there are many birds or bats.  What are bedbug bites like? A bedbug bite leaves a small red bump with a darker red dot in the middle. The bump may show up soon after a person is bitten or a day or more later. Bedbug bites usually do not hurt, but they may itch. Most people do not need treatment for bedbug bites. The bumps usually go away on their own in a few days. How do I check for bedbugs? Bedbugs are reddish-brown, oval, and flat. They are very small and they cannot fly. Look for bedbugs in these places:  On mattresses, bed frames, headboards, and box springs.  On drapes and curtains in bedrooms.  Under the carpet in bedrooms.  Behind electrical outlets.  Behind any wallpaper that is peeling.  Inside luggage.  Also look for black or red spots or stains on or near the bed. What should I do if I find bedbugs? When Traveling Check your clothes, suitcase, and belongings for bedbugs before you go back home. You may want to throw away anything that has bedbugs on it. At Home Your bedroom may need to be treated by a pest control expert. You may also need to throw away mattresses or luggage. To help keep bedbugs from coming back, you may want to:  Put a plastic cover over your mattress.  Wash your clothes and bedding in water that is hotter than 120F (48.9C). Dry them on a hot setting.  Vacuum often around the bed and in all of the cracks where the bugs might hide.  Check all used furniture, bedding, or clothes that you bring into your home.  Get rid of bird nests and bat roosts that are near your home.  In Your Bed Try wearing pajamas that  have long sleeves and pant legs. Bedbugs usually bite areas of the skin that are not covered. This information is not intended to replace advice given to you by your health care provider. Make sure you discuss any questions you have with your health care provider. Document Released: 05/09/2010 Document Revised: 06/30/2015 Document Reviewed: 01/18/2014 Elsevier Interactive Patient Education  2018 [place], Adult Scabies is a skin condition that happens when very small insects get under the skin (infestation). This causes a rash and severe itchiness. Scabies can spread from person to person (is contagious). If you get scabies, it is common for others in your household to get scabies too. With proper treatment, symptoms usually go away in 2-4 weeks. Scabies usually does not cause lasting problems. What are the causes? This condition is caused by mites (Sarcoptes scabiei, or human itch mites) that can only be seen with a microscope. The mites get into the top layer of skin and lay eggs. Scabies can spread from person to person through:  Close contact with a person who has scabies.  Contact with infested items, such as towels, bedding, or clothing.  What increases the risk? This condition is more likely to develop in:  People who live in nursing homes and other extended-care facilities.  People who have sexual contact with a partner who has scabies.  Young  children who attend child care facilities.  People who care for others who are at increased risk for scabies.  What are the signs or symptoms? Symptoms of this condition may include:  Severe itchiness. This is often worse at night.  A rash that includes tiny red bumps or blisters. The rash commonly occurs on the wrist, elbow, armpit, fingers, waist, groin, or buttocks. Bumps may form a line (burrow) in some areas.  Skin irritation. This can include scaly patches or sores.  How is this diagnosed? This condition is  diagnosed with a physical exam. Your health care provider will look closely at your skin. In some cases, your health care provider may take a sample of your affected skin (skin scraping) and have it examined under a microscope. How is this treated? This condition may be treated with:  Medicated cream or lotion that kills the mites. This is spread on the entire body and left on for several hours. Usually, one treatment with medicated cream or lotion is enough to kill all of the mites. In severe cases, the treatment may be repeated.  Medicated cream that relieves itching.  Medicines that help to relieve itching.  Medicines that kill the mites. This treatment is rarely used.  Follow these instructions at home:  Medicines  Take or apply over-the-counter and prescription medicines as told by your health care provider.  Apply medicated cream or lotion as told by your health care provider.  Do not wash off the medicated cream or lotion until the necessary amount of time has passed. Skin Care  Avoid scratching your affected skin.  Keep your fingernails closely trimmed to reduce injury from scratching.  Take cool baths or apply cool washcloths to help reduce itching. General instructions  Clean all items that you recently had contact with, including bedding, clothing, and furniture. Do this on the same day that your treatment starts. ? Use hot water when you wash items. ? Place unwashable items into closed, airtight plastic bags for at least 3 days. The mites cannot live for more than 3 days away from human skin. ? Vacuum furniture and mattresses that you use.  Make sure that other people who may have been infested are examined by a health care provider. These include members of your household and anyone who may have had contact with infested items.  Keep all follow-up visits as told by your health care provider. This is important. Contact a health care provider if:  You have itching  that does not go away after 4 weeks of treatment.  You continue to develop new bumps or burrows.  You have redness, swelling, or pain in your rash area after treatment.  You have fluid, blood, or pus coming from your rash. This information is not intended to replace advice given to you by your health care provider. Make sure you discuss any questions you have with your health care provider. Document Released: 10/13/2014 Document Revised: 06/30/2015 Document Reviewed: 08/24/2014 Elsevier Interactive Patient Education  2018 [place]. Permethrin lotion What is this medicine? PERMETHRIN (per METH rin) is used to treat head lice infestations. It acts by destroying both the lice and their eggs. This medicine may be used for other purposes; ask your health care provider or pharmacist if you have questions. COMMON BRAND NAME(S): Nix Complete Lice Elimination Kit, Nix Lice Killing Creme Rinse What should I tell my health care provider before I take this medicine? They need to know if you have any of these conditions: -asthma -  an unusual or allergic reaction to permethrin, veterinary or household insecticides, other medicines, chrysanthemums, foods, dyes, or preservatives -pregnant or trying to get pregnant -breast-feeding How should I use this medicine? This medicine is for external use only. Do not take by mouth. Follow the directions on the product label. Do not wash hair with conditioner or a combination shampoo-conditioner immediately before applying this medicine. Follow product-specific instructions for length of application and product removal. All products should be rinsed from the hair over a sink or tub to limit skin exposure; do not use hot water. In general, do not re-wash the hair for 1 to 2 days after use. Talk to your pediatrician regarding the use of this medicine in children. While this drug may be prescribed for children as young as 35 months old for selected conditions,  precautions do apply. Overdosage: If you think you have taken too much of this medicine contact a poison control center or emergency room at once. NOTE: This medicine is only for you. Do not share this medicine with others. What if I miss a dose? This does not apply. What may interact with this medicine? Interactions are not expected. Do not use any other skin products on the affected area without telling your doctor or health care professional. This list may not describe all possible interactions. Give your health care provider a list of all the medicines, herbs, non-prescription drugs, or dietary supplements you use. Also tell them if you smoke, drink alcohol, or use illegal drugs. Some items may interact with your medicine. What should I watch for while using this medicine? This medicine is used as a single application treatment. If live lice are observed 7 or more days after initial application, a second treatment may be needed. Head lice can be spread from one person to another by direct contact with clothing, hats, scarves, bedding, towels, washcloths, hairbrushes, and combs. All members of your household should be examined for head lice and should receive treatment if they are found to be infected. If you have any questions about this, check with your doctor or health care professional. To prevent reinfection or spreading of the infection, the following steps should be taken: Machine wash all clothing, bedding, towels, and washcloths in very hot water and dry them using the hot cycle of a dryer for at least 20 minutes. Clothing or bedding that cannot be washed should be dry cleaned or sealed in an airtight plastic bag for 2 weeks. Shampoo any wigs or hairpieces. You should also wash all hairbrushes and combs in very hot soapy water (above 130 degrees F) for 5 to 10 minutes. Do not share your hairbrushes or combs with other people. Wash all toys in very hot water (above 130 degrees F) for 5 to 10  minutes or seal in an airtight plastic bag for 2 weeks. Also, clean the house or room by vacuuming furniture, rugs, and floors. What side effects may I notice from receiving this medicine? Side effects that usually do not require medical attention (report to your doctor or health care professional if they continue or are bothersome): -itching -redness or mild swelling of the scalp -stinging or burning -tingling sensation This list may not describe all possible side effects. Call your doctor for medical advice about side effects. You may report side effects to FDA at 1-800-FDA-1088. Where should I keep my medicine? Keep out of the reach of children. Store at room temperature away from heat and direct light. Do not refrigerate or freeze.  After treatment, throw away any unused medicine. NOTE: This sheet is a summary. It may not cover all possible information. If you have questions about this medicine, talk to your doctor, pharmacist, or health care provider.  2018 Elsevier/Gold Standard (2015-08-19 11:51:24)

## 2017-06-20 ENCOUNTER — Ambulatory Visit (INDEPENDENT_AMBULATORY_CARE_PROVIDER_SITE_OTHER): Payer: Medicaid Other | Admitting: Internal Medicine

## 2017-06-20 ENCOUNTER — Encounter: Payer: Self-pay | Admitting: Internal Medicine

## 2017-06-20 DIAGNOSIS — K219 Gastro-esophageal reflux disease without esophagitis: Secondary | ICD-10-CM

## 2017-06-20 DIAGNOSIS — R058 Other specified cough: Secondary | ICD-10-CM

## 2017-06-20 DIAGNOSIS — R05 Cough: Secondary | ICD-10-CM | POA: Diagnosis not present

## 2017-06-20 NOTE — Progress Notes (Signed)
Subjective:     Patient ID: Kathleen Herrera, female   DOB: Jun 21, 1960,    MRN: [id number]  HPI  6 yobf active smoker with "skin itching/ rash"  Rhinorrhea / coughing eval by allergist >" very allergic" so placed on shots from ages [age] by allergist [place]" and helped some but cough still persisted then much worse since summer 2018  so referred to pulmonary clinic 06/20/2017 by  Erin Sons Hollis/ Joaquin Courts .   06/20/2017 1st [facility] office visit/ Jerilee Space   Chief Complaint  Patient presents with  . Pulmonary Consult    Referred by Julianne Handler, NP. Pt c/o cough for the past several months. Cough is occ prod with clear sputum. Eating can occ trigger the cough.   although rhinorrhea persistent since childhood that really changed much but summer of 2018 cough much worse and resulted  1) ER eval with nl cxr in 09/29/16 2) ER 10/31/16 ataxia with nl sinus mri 11/01/17  3) 2/101/19 RUL pna assoc cough / wheezing chills  4) 05/20/17 cxr nl   Cough worse right at hs / no am flare / already says takes gabapentin 300 tid for chronic pain  "Albuterol does nothing neither does symbicort "/ using lots of mint products  No change doe = MMRC1 = can walk nl pace, flat grade, can't hurry or go uphills or steps s sob     Kouffman Reflux v Neurogenic Cough Differentiator Reflux Comments  Do you awaken from a sound sleep coughing violently?                            With trouble breathing? Yes def  No    Do you have choking episodes when you cannot  Get enough air, gasping for air ?              no   Do you usually cough when you lie down into  The bed, or when you just lie down to rest ?                          Yes   Do you usually cough after meals or eating?         Yes   Do you cough when (or after) you bend over?    no   GERD SCORE     Kouffman Reflux v Neurogenic Cough Differentiator Neurogenic   Do you more-or-less cough all day long? Sporadic    Does change of temperature make  you cough? When gets hot   Does laughing or chuckling cause you to cough? yes   Do fumes (perfume, automobile fumes, burned  Toast, etc.,) cause you to cough ?      Yes def    Does speaking, singing, or talking on the phone cause you to cough   ?               No    Neurogenic/Airway score      Intermittent dysphagia    No obvious other patterns in day to day or daytime variability or assoc excess/ purulent sputum or mucus plugs or hemoptysis or cp or chest tightness, subjective wheeze or overt sinus or hb symptoms. No unusual exposure hx or h/o childhood pna/ asthma or knowledge of premature birth.    Also denies any obvious fluctuation of symptoms with weather or environmental changes or other aggravating or alleviating factors  except as outlined above   Current Allergies, Complete Past Medical History, Past Surgical History, Family History, and Social History were reviewed in [employer] record.  ROS  The following are not active complaints unless bolded Hoarseness, sore throat, dysphagia, dental problems, itching, sneezing,  nasal congestion or discharge of excess mucus or purulent secretions, ear ache,   fever, chills, sweats, unintended wt loss or wt gain, classically pleuritic or exertional cp,  orthopnea pnd or arm/hand swelling  or leg swelling, presyncope, palpitations, abdominal pain, anorexia, nausea, vomiting, diarrhea  or change in bowel habits or change in bladder habits, change in stools or change in urine, dysuria, hematuria,  rash, arthralgias= low back, both knees , visual complaints, headache, numbness, weakness or ataxia or problems with walking or coordination,  change in mood or  memory.        Current Meds  Medication Sig  . albuterol (PROVENTIL HFA;VENTOLIN HFA) 108 (90 Base) MCG/ACT inhaler Inhale 2 puffs into the lungs every 6 (six) hours as needed for wheezing or shortness of breath.  Marland Kitchen amLODipine (NORVASC) 10 MG tablet Take 1 tablet (10 mg  total) by mouth daily.  . beta carotene 30 MG capsule Take 30 mg by mouth daily.  . clotrimazole-betamethasone (LOTRISONE) cream Apply 1 application topically 2 (two) times daily.  . furosemide (LASIX) 20 MG tablet TAKE 1 TABLET (20 MG TOTAL) BY MOUTH DAILY.  Marland Kitchen gabapentin (NEURONTIN) 300 MG capsule TAKE 1 CAPSULE (300 MG TOTAL) BY MOUTH THREE TIMES DAILY.  . GNP ASPIRIN 325 MG tablet TAKE 1 TABLET (325 MG TOTAL) BY MOUTH DAILY.  . hydrocortisone 2.5 % cream Apply topically 2 (two) times daily.  . hydrOXYzine (ATARAX/VISTARIL) 25 MG tablet Take 1 tablet (25 mg total) by mouth 3 (three) times daily as needed for itching or anxiety.  . lamoTRIgine (LAMICTAL) 25 MG tablet Take 1 tablet (25 mg total) by mouth 2 (two) times daily. For mood control (Patient taking differently: Take 25 mg by mouth 3 (three) times daily. For mood control)  . lithium carbonate 300 MG capsule Take 1 capsule (300 mg total) by mouth every morning. For mood control  . lithium carbonate 600 MG capsule Take 1 capsule (600 mg total) by mouth at bedtime. For mood control  . meclizine (ANTIVERT) 12.5 MG tablet TAKE ONE TABLET (12.5 MG TOTAL) BY MOUTH TWICE A DAY AS NEEDED FOR DIZZINESS  . metoprolol succinate (TOPROL-XL) 50 MG 24 hr tablet Take 2 tablets (100 mg total) by mouth daily. Take with or immediately following a meal.  . nystatin (MYCOSTATIN/NYSTOP) powder APPLY TWO SPRINKLES OF POWDER TO THE AFFECTED INNER THIGHS TO PREVENT ITCHING AND FUNGAL RASH  . pantoprazole (PROTONIX) 40 MG tablet TAKE 1 TABLET (40 MG TOTAL) BY MOUTH TWO TIMES DAILY BEFORE A MEAL.  Marland Kitchen PARoxetine (PAXIL) 30 MG tablet Take 30 mg by mouth every morning.  . simvastatin (ZOCOR) 20 MG tablet TAKE ONE TABLET   (20 MG TOTAL ) BY MOUTH   AT BEDTIME  . solifenacin (VESICARE) 5 MG tablet Take 1 tablet (5 mg total) by mouth daily.  . SYMBICORT 80-4.5 MCG/ACT inhaler INHALE TWO PUFFS INTO THE LUNGS TWO TIMES DAILY.  . traMADol-acetaminophen (ULTRACET) 37.5-325 MG  tablet Take 1-2 tablets by mouth every 6 (six) hours as needed.           Review of Systems     Objective:   Physical Exam    amb obese wf moves slowly due to back not  sob  Wt Readings from Last 3 Encounters:  06/20/17 246 lb 9.6 oz (111.9 kg)  06/13/17 252 lb (114.3 kg)  05/20/17 249 lb (112.9 kg)     Vital signs reviewed - Note on arrival 02 sats  97% on RA   HEENT: nl dentition, turbinates bilaterally, and oropharynx. Nl external ear canals without cough reflex   NECK :  without JVD/Nodes/TM/ nl carotid upstrokes bilaterally   LUNGS: no acc muscle use,  Nl contour chest which is clear to A and P bilaterally without cough on insp or exp maneuvers   CV:  RRR  no s3 or murmur or increase in P2, and no edema   ABD:  Quite obese  with  Limited inspiratory excursion in the supine position. No bruits or organomegaly appreciated, bowel sounds nl  MS:  Slow gait/ ext warm without deformities, calf tenderness, cyanosis or clubbing No obvious joint restrictions   SKIN: warm and dry without lesions    NEURO:  alert, approp, nl sensorium with  no motor or cerebellar deficits apparent.      I personally reviewed images and agree with radiology impression as follows:  CXR:   4/151/19 No active cardiopulmonary disease.    Assessment:

## 2017-06-20 NOTE — Patient Instructions (Addendum)
Continue pantoprazole 40 mg Take 30- 60 min before your first and last meals of the day   Ok to stop albuterol and symbicort - restart if get worse   GERD (REFLUX)  is an extremely common cause of respiratory symptoms just like yours , many times with no obvious heartburn at all.    It can be treated with medication, but also with lifestyle changes including elevation of the head of your bed (ideally with 6 inch  bed blocks),  Smoking cessation, avoidance of late meals, excessive alcohol, and avoid fatty foods, chocolate, peppermint, colas, red wine, and acidic juices such as orange juice.  NO MINT OR MENTHOL PRODUCTS SO NO COUGH DROPS   USE SUGARLESS CANDY INSTEAD (Jolley ranchers or Stover's or Life Savers) or even ice chips will also do - the key is to swallow to prevent all throat clearing. NO OIL BASED VITAMINS - use powdered substitutes.  For drainage / throat tickle try take CHLORPHENIRAMINE  4 mg - take one every 4 hours as needed - available over the counter- may cause drowsiness so start with a dose or two an hour before bed  and see how you tolerate it before trying in daytime     If not better in a couple of weeks please call me to schedule a swallowing evaluation   The key is to stop smoking completely before smoking completely stops you!

## 2017-06-21 ENCOUNTER — Encounter: Payer: Self-pay | Admitting: Internal Medicine

## 2017-06-21 DIAGNOSIS — R05 Cough: Secondary | ICD-10-CM | POA: Insufficient documentation

## 2017-06-21 DIAGNOSIS — R058 Other specified cough: Secondary | ICD-10-CM | POA: Insufficient documentation

## 2017-06-21 NOTE — Assessment & Plan Note (Signed)
Body mass index is 43 kg/m.    Lab Results  Component Value Date   TSH 1.501 02/27/2017     Contributing to gerd risk/ doe/reviewed the need and the process to achieve and maintain neg calorie balance > defer f/u primary care including intermittently monitoring thyroid status

## 2017-06-21 NOTE — Assessment & Plan Note (Addendum)
REC: Try off symbicort 06/20/2017 and max rx for GERD and 1st gen H1 blockers per guidelines  and if not better DgEs next  - if worse restart symb as would indicate an alternative dx of cough variant asthma   - The proper method of use, as well as anticipated side effects, of a metered-dose inhaler are discussed and demonstrated to the patient. Improved effectiveness after extensive coaching during this visit to a level of approximately 25 % from a baseline of 50 % with hfa     Comment:  Upper airway cough syndrome (previously labeled PNDS),  is so named because it's frequently impossible to sort out how much is  CR/sinusitis with freq throat clearing (which can be related to primary GERD)   vs  causing  secondary (" extra esophageal")  GERD from wide swings in gastric pressure that occur with throat clearing, often  promoting self use of mint and menthol lozenges that reduce the lower esophageal sphincter tone and exacerbate the problem further in a cyclical fashion.   These are the same pts (now being labeled as having "irritable larynx syndrome" by some cough centers) who not infrequently have a history of having failed to tolerate ace inhibitors,  dry powder inhalers (but sometimes also hfa ICS which may be the case here)  or biphosphonates or report having atypical/extraesophageal reflux symptoms that don't respond to standard doses of PPI  and are easily confused as having aecopd or asthma flares by even experienced allergists/ pulmonologists (myself included).    Total time devoted to counseling  > 50 % of initial 60 min office visit:  review case with pt/ discussion of options/alternatives/ personally creating written customized instructions  in presence of pt  then going over those specific  Instructions directly with the pt including how to use all of the meds but in particular covering each new medication in detail and the difference between the maintenance= "automatic" meds and the prns using  an action plan format for the latter (If this problem/symptom => do that organization reading Left to right).  Please see AVS from this visit for a full list of these instructions which I personally wrote for this pt and  are unique to this visit.   See device teaching which extended face to face time for this visit

## 2017-06-21 NOTE — Assessment & Plan Note (Signed)
Clinical dx assoc with dysphagia and cough  Of the three most common causes of  Sub-acute / recurrent or chronic cough, only one (GERD)  can actually contribute to/ trigger  the other two (asthma and post nasal drip syndrome)  and perpetuate the cylce of cough.  While not intuitively obvious, many patients with chronic low grade reflux do not cough until there is a primary insult that disturbs the protective epithelial barrier and exposes sensitive nerve endings.   This is typically viral but can due to PNDS and  either may apply here.     The point is that once this occurs, it is difficult to eliminate the cycle  using anything but a maximally effective acid suppression regimen at least in the short run, accompanied by an appropriate diet to address non acid GERD and control symptoms of pnds with 1st gen H1 blockers per guidelines  Then Dg Es if not better  OFF ALL MINT PRODUCTS.

## 2017-06-25 ENCOUNTER — Other Ambulatory Visit: Payer: Self-pay | Admitting: Family Medicine

## 2017-07-08 ENCOUNTER — Encounter: Payer: Self-pay | Admitting: Family Medicine

## 2017-07-08 ENCOUNTER — Ambulatory Visit (INDEPENDENT_AMBULATORY_CARE_PROVIDER_SITE_OTHER): Payer: Medicaid Other | Admitting: Family Medicine

## 2017-07-08 VITALS — BP 116/62 | HR 64 | Temp 98.0°F | Ht 63.5 in | Wt 253.7 lb

## 2017-07-08 DIAGNOSIS — L299 Pruritus, unspecified: Secondary | ICD-10-CM

## 2017-07-08 DIAGNOSIS — R21 Rash and other nonspecific skin eruption: Secondary | ICD-10-CM

## 2017-07-08 DIAGNOSIS — I10 Essential (primary) hypertension: Secondary | ICD-10-CM

## 2017-07-08 DIAGNOSIS — Z09 Encounter for follow-up examination after completed treatment for conditions other than malignant neoplasm: Secondary | ICD-10-CM

## 2017-07-08 DIAGNOSIS — W57XXXA Bitten or stung by nonvenomous insect and other nonvenomous arthropods, initial encounter: Secondary | ICD-10-CM | POA: Diagnosis not present

## 2017-07-08 DIAGNOSIS — F411 Generalized anxiety disorder: Secondary | ICD-10-CM

## 2017-07-08 MED ORDER — TRIAMCINOLONE ACETONIDE 0.1 % EX CREA: 1.0000 "application " | TOPICAL_CREAM | Freq: Two times a day (BID) | CUTANEOUS | 0 refills | Status: DC

## 2017-07-08 NOTE — Patient Instructions (Signed)
Triamcinolone skin cream, ointment, lotion, or aerosol What is this medicine? TRIAMCINOLONE (trye am SIN oh lone) is a corticosteroid. It is used on the skin to reduce swelling, redness, itching, and allergic reactions. This medicine may be used for other purposes; ask your health care provider or pharmacist if you have questions. COMMON BRAND NAME(S): Aristocort, Aristocort A, Aristocort HP, Cinalog, Cinolar, DERMASORB TA Complete, Flutex, Kenalog, Pediaderm TA, SP Rx 228, Triacet, Trianex, Triderm What should I tell my health care provider before I take this medicine? They need to know if you have any of these conditions: -diabetes -infection, like tuberculosis, herpes, or fungal infection -large areas of burned or damaged skin -skin wasting or thinning -an unusual or allergic reaction to triamcinolone, corticosteroids, other medicines, foods, dyes, or preservatives -pregnant or trying to get pregnant -breast-feeding How should I use this medicine? This medicine is for external use only. Do not take by mouth. Follow the directions on the prescription label. Wash your hands before and after use. Apply a thin film of medicine to the affected area. Do not cover with a bandage or dressing unless your doctor or health care professional tells you to. Do not use on healthy skin or over large areas of skin. Do not get this medicine in your eyes. If you do, rinse out with plenty of cool tap water. It is important not to use more medicine than prescribed. Do not use your medicine more often than directed. Talk to your pediatrician regarding the use of this medicine in children. Special care may be needed. Elderly patients are more likely to have damaged skin through aging, and this may increase side effects. This medicine should only be used for brief periods and infrequently in older patients. Overdosage: If you think you have taken too much of this medicine contact a poison control center or emergency  room at once. NOTE: This medicine is only for you. Do not share this medicine with others. What if I miss a dose? If you miss a dose, use it as soon as you can. If it is almost time for your next dose, use only that dose. Do not use double or extra doses. What may interact with this medicine? Interactions are not expected. This list may not describe all possible interactions. Give your health care provider a list of all the medicines, herbs, non-prescription drugs, or dietary supplements you use. Also tell them if you smoke, drink alcohol, or use illegal drugs. Some items may interact with your medicine. What should I watch for while using this medicine? Tell your doctor or health care professional if your symptoms do not start to get better within one week. Do not use for more than 14 days. Do not use on healthy skin or over large areas of skin. Tell your doctor or health care professional if you are exposed to anyone with measles or chickenpox, or if you develop sores or blisters that do not heal properly. Do not use an airtight bandage to cover the affected area unless your doctor or health care professional tells you to. If you are to cover the area, follow the instructions carefully. Covering the area where the medicine is applied can increase the amount that passes through the skin and increases the risk of side effects. If treating the diaper area of a child, avoid covering the treated area with tight-fitting diapers or plastic pants. This may increase the amount of medicine that passes through the skin and increase the risk of serious side   effects. What side effects may I notice from receiving this medicine? Side effects that you should report to your doctor or health care professional as soon as possible: -burning or itching of the skin -dark red spots on the skin -infection -painful, red, pus filled blisters in hair follicles -thinning of the skin, sunburn more likely especially on the  face Side effects that usually do not require medical attention (report to your doctor or health care professional if they continue or are bothersome): -dry skin, irritation -unusual increased growth of hair on the face or body This list may not describe all possible side effects. Call your doctor for medical advice about side effects. You may report side effects to FDA at 1-800-FDA-1088. Where should I keep my medicine? Keep out of the reach of children. Store at room temperature between 15 and 30 degrees C (59 and 86 degrees F). Do not freeze. Throw away any unused medicine after the expiration date. NOTE: This sheet is a summary. It may not cover all possible information. If you have questions about this medicine, talk to your doctor, pharmacist, or health care provider.  2018 Elsevier/Gold Standard (2013-05-14 15:59:51)  

## 2017-07-08 NOTE — Progress Notes (Signed)
Subjective:    Patient ID: [name], female    DOB: 11/02/1960, 57 y.o.   MRN: [id number]   PCP: Raliegh Ip, NP  Chief Complaint  Patient presents with  . PAPERWORK    fl2 form   Past Medical History:  Diagnosis Date  . Arthritis   . Asthma   . 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 Bipolar disorder (HCC)   . Bronchitis   . Depression   . Fibromyalgia Y-2  . Fibromyalgia unknown  . Hyperlipemia   . Hypertension   . Post traumatic stress disorder   . Sinus congestion     Current Status: She is here today for assessment of skin 'bites.'  She states that they are r/t bedbugs. She is currently unable to relocate from the apartment complex that she is current living.  She was given a letter from our office, by [place] Hollis, NP on 06/13/2017 to give to her landlord about possible bedbug infestation. She continues to have rash and itching, but states that it has not worsened.  Denies fevers, chills, increased fatigue, recent infections, and night sweats. She denies shortness of breath, and chest pain.   Family History  Problem Relation Age of Onset  . Dementia Mother   . Prostate cancer Father   . Dementia Father   . Prostate cancer Other   . CAD Other     Social History   Socioeconomic History  . Marital status: Single    Spouse name: Not on file  . Number of children: Not on file  . Years of education: Not on file  . Highest education level: Not on file  Occupational History  . Not on file  Social Needs  . Financial resource strain: Not on file  . Food insecurity:    Worry: Not on file    Inability: Not on file  . Transportation needs:    Medical: Not on file    Non-medical: Not on file  Tobacco Use  . Smoking status: Current Every Day Smoker    Packs/day: 0.25    Years: 37.00    Pack years: 9.25    Types: Cigarettes  . Smokeless tobacco: Never Used  Substance and Sexual Activity  . Alcohol use: Yes    Comment: occasionally  . Drug use: No    Comment:  recovering crack addict  . Sexual activity: Not Currently  Lifestyle  . Physical activity:    Days per week: Not on file    Minutes per session: Not on file  . Stress: Not on file  Relationships  . Social connections:    Talks on phone: Not on file    Gets together: Not on file    Attends religious service: Not on file    Active member of club or organization: Not on file    Attends meetings of clubs or organizations: Not on file    Relationship status: Not on file  . Intimate partner violence:    Fear of current or ex partner: Not on file    Emotionally abused: Not on file    Physically abused: Not on file    Forced sexual activity: Not on file  Other Topics Concern  . Not on file  Social History Narrative  . Not on file   Past Surgical History:  Procedure Laterality Date  . ABDOMINAL HYSTERECTOMY     .   Review of Systems  Constitutional: Negative.   HENT: Negative.   Eyes: Negative.   Respiratory:  Positive for cough (occasional; r/t allergies/asthma).   Cardiovascular: Negative.   Gastrointestinal: Negative.   Endocrine: Negative.   Genitourinary: Negative.   Skin: Positive for rash (itchy, red).  Allergic/Immunologic: Negative.   Neurological: Positive for dizziness (r/t vertigo).  Hematological: Negative.   Psychiatric/Behavioral: Negative.    Objective:   Physical Exam  Constitutional: She is oriented to person, place, and time. She appears well-developed and well-nourished.  HENT:  Head: Normocephalic and atraumatic.  Left Ear: External ear normal.  Nose: Nose normal.  Mouth/Throat: Oropharynx is clear and moist.  Eyes: Pupils are equal, round, and reactive to light. Conjunctivae and EOM are normal.  Neck: Normal range of motion. Neck supple.  Cardiovascular: Normal rate, regular rhythm, normal heart sounds and intact distal pulses.  Pulmonary/Chest: Effort normal and breath sounds normal.  Abdominal: Soft. Bowel sounds are normal.  Musculoskeletal:  Normal range of motion.  Neurological: She is alert and oriented to person, place, and time.  Skin: Skin is warm and dry. Capillary refill takes less than 2 seconds. Rash (red, itchy) noted. There is erythema (legs and arms).  Psychiatric: She has a normal mood and affect. Her behavior is normal. Judgment and thought content normal.  Nursing note and vitals reviewed.  Assessment & Plan:   1. Bedbug bite, initial encounter Patient was counseled prevention of bedbugs:  PREVENTION Certain measures may be helpful for preventing bedbug infestation:  ?Visual examination of hotel rooms or other new sleeping areas for bedbugs or bedbug feces prior to use, with particular attention to mattress cords and crevices in box springs.  ?Placement of luggage on a luggage rack or away from the bed while traveling. Placement of worn garments in a sealed plastic bag to minimize bedbug attraction to worn clothing.  ?Careful examination of "used" items, such as items from garage sales or resale shops (especially bedding items), for bedbugs or bedbug feces prior to bringing them inside the home.  - triamcinolone cream (KENALOG) 0.1 %; Apply 1 application topically 2 (two) times daily.  Dispense: 30 g; Refill: 0  2. Pruritus Stable. Not worsened. She will use Triamcinolone cream as directed. She will take oral Benadryl as needed for intense itching.   3. Generalized anxiety disorder She is followed by psychiatrist at [facility]. Continue Lithium and Paxil as directed.   4. Essential hypertension BP is 116/62 today. Continue Amlodipine, Lasix, and Metoprolol as directed.   5. Generalized rash Stable. No signs of infection noted. We will order Triamcinolone Cream to help elleviate symptoms.   6. Follow up She will follow up in 1 month for re-assessment.   Meds ordered this encounter  Medications  . triamcinolone cream (KENALOG) 0.1 %    Sig: Apply 1 application topically 2 (two) times daily.    Dispense:   30 g    Refill:  0    Raliegh IpNatalie Somer Trotter,  MSN, FNP-BC Patient [facility] [address]  [place], [place] [address] [phone]

## 2017-07-16 ENCOUNTER — Other Ambulatory Visit: Payer: Self-pay

## 2017-07-26 ENCOUNTER — Other Ambulatory Visit: Payer: Self-pay | Admitting: Family Medicine

## 2017-08-07 ENCOUNTER — Ambulatory Visit: Payer: Self-pay | Admitting: Family Medicine

## 2017-08-10 ENCOUNTER — Other Ambulatory Visit: Payer: Self-pay | Admitting: Internal Medicine

## 2017-08-10 ENCOUNTER — Other Ambulatory Visit: Payer: Self-pay | Admitting: Family Medicine

## 2017-08-10 DIAGNOSIS — I10 Essential (primary) hypertension: Secondary | ICD-10-CM

## 2017-08-15 ENCOUNTER — Other Ambulatory Visit: Payer: Self-pay | Admitting: Family Medicine

## 2017-08-19 ENCOUNTER — Other Ambulatory Visit: Payer: Self-pay

## 2017-08-19 MED ORDER — TRAMADOL-ACETAMINOPHEN 37.5-325 MG PO TABS: 1.0000 | ORAL_TABLET | Freq: Four times a day (QID) | ORAL | 0 refills | Status: DC | PRN

## 2017-09-17 ENCOUNTER — Ambulatory Visit: Payer: Self-pay | Admitting: Family Medicine

## 2017-09-24 ENCOUNTER — Encounter: Payer: Self-pay | Admitting: Family Medicine

## 2017-09-24 ENCOUNTER — Ambulatory Visit (INDEPENDENT_AMBULATORY_CARE_PROVIDER_SITE_OTHER): Payer: Medicaid Other | Admitting: Family Medicine

## 2017-09-24 VITALS — BP 120/94 | HR 60 | Temp 98.1°F | Ht 63.0 in | Wt 249.0 lb

## 2017-09-24 DIAGNOSIS — E7800 Pure hypercholesterolemia, unspecified: Secondary | ICD-10-CM | POA: Diagnosis not present

## 2017-09-24 DIAGNOSIS — Z23 Encounter for immunization: Secondary | ICD-10-CM | POA: Diagnosis not present

## 2017-09-24 DIAGNOSIS — S161XXA Strain of muscle, fascia and tendon at neck level, initial encounter: Secondary | ICD-10-CM | POA: Diagnosis not present

## 2017-09-24 DIAGNOSIS — M79604 Pain in right leg: Secondary | ICD-10-CM

## 2017-09-24 DIAGNOSIS — Z09 Encounter for follow-up examination after completed treatment for conditions other than malignant neoplasm: Secondary | ICD-10-CM

## 2017-09-24 DIAGNOSIS — M79605 Pain in left leg: Secondary | ICD-10-CM

## 2017-09-24 MED ORDER — SIMVASTATIN 20 MG PO TABS: ORAL_TABLET | ORAL | 3 refills | Status: DC

## 2017-09-24 MED ORDER — PANTOPRAZOLE SODIUM 40 MG PO TBEC: DELAYED_RELEASE_TABLET | ORAL | 6 refills | Status: DC

## 2017-09-24 MED ORDER — CYCLOBENZAPRINE HCL 10 MG PO TABS: 10.0000 mg | ORAL_TABLET | Freq: Three times a day (TID) | ORAL | 2 refills | Status: DC | PRN

## 2017-09-24 MED ORDER — TRAMADOL HCL 50 MG PO TABS: 50.0000 mg | ORAL_TABLET | Freq: Four times a day (QID) | ORAL | 0 refills | Status: DC | PRN

## 2017-09-24 NOTE — Patient Instructions (Signed)
Tramadol tablets What is this medicine? TRAMADOL (TRA ma dole) is a pain reliever. It is used to treat moderate to severe pain in adults. This medicine may be used for other purposes; ask your health care provider or pharmacist if you have questions. COMMON BRAND NAME(S): Ultram What should I tell my health care provider before I take this medicine? They need to know if you have any of these conditions: -brain tumor -depression -drug abuse or addiction -head injury -if you frequently drink alcohol containing drinks -kidney disease or trouble passing urine -liver disease -lung disease, asthma, or breathing problems -seizures or epilepsy -suicidal thoughts, plans, or attempt; a previous suicide attempt by you or a family member -an unusual or allergic reaction to tramadol, codeine, other medicines, foods, dyes, or preservatives -pregnant or trying to get pregnant -breast-feeding How should I use this medicine? Take this medicine by mouth with a full glass of water. Follow the directions on the prescription label. You can take it with or without food. If it upsets your stomach, take it with food. Do not take your medicine more often than directed. A special MedGuide will be given to you by the pharmacist with each prescription and refill. Be sure to read this information carefully each time. Talk to your pediatrician regarding the use of this medicine in children. Special care may be needed. Overdosage: If you think you have taken too much of this medicine contact a poison control center or emergency room at once. NOTE: This medicine is only for you. Do not share this medicine with others. What if I miss a dose? If you miss a dose, take it as soon as you can. If it is almost time for your next dose, take only that dose. Do not take double or extra doses. What may interact with this medicine? Do not take this medication with any of the following medicines: -MAOIs like Carbex, Eldepryl,  Marplan, Nardil, and Parnate This medicine may also interact with the following medications: -alcohol -antihistamines for allergy, cough and cold -certain medicines for anxiety or sleep -certain medicines for depression like amitriptyline, fluoxetine, sertraline -certain medicines for migraine headache like almotriptan, eletriptan, frovatriptan, naratriptan, rizatriptan, sumatriptan, zolmitriptan -certain medicines for seizures like carbamazepine, oxcarbazepine, phenobarbital, primidone -certain medicines that treat or prevent blood clots like warfarin -digoxin -furazolidone -general anesthetics like halothane, isoflurane, methoxyflurane, propofol -linezolid -local anesthetics like lidocaine, pramoxine, tetracaine -medicines that relax muscles for surgery -other narcotic medicines for pain or cough -phenothiazines like chlorpromazine, mesoridazine, prochlorperazine, thioridazine -procarbazine This list may not describe all possible interactions. Give your health care provider a list of all the medicines, herbs, non-prescription drugs, or dietary supplements you use. Also tell them if you smoke, drink alcohol, or use illegal drugs. Some items may interact with your medicine. What should I watch for while using this medicine? Tell your doctor or health care professional if your pain does not go away, if it gets worse, or if you have new or a different type of pain. You may develop tolerance to the medicine. Tolerance means that you will need a higher dose of the medicine for pain relief. Tolerance is normal and is expected if you take this medicine for a long time. Do not suddenly stop taking your medicine because you may develop a severe reaction. Your body becomes used to the medicine. This does NOT mean you are addicted. Addiction is a behavior related to getting and using a drug for a non-medical reason. If you have pain, you   have a medical reason to take pain medicine. Your doctor will tell  you how much medicine to take. If your doctor wants you to stop the medicine, the dose will be slowly lowered over time to avoid any side effects. There are different types of narcotic medicines (opiates). If you take more than one type at the same time or if you are taking another medicine that also causes drowsiness, you may have more side effects. Give your health care provider a list of all medicines you use. Your doctor will tell you how much medicine to take. Do not take more medicine than directed. Call emergency for help if you have problems breathing or unusual sleepiness. You may get drowsy or dizzy. Do not drive, use machinery, or do anything that needs mental alertness until you know how this medicine affects you. Do not stand or sit up quickly, especially if you are an older patient. This reduces the risk of dizzy or fainting spells. Alcohol can increase or decrease the effects of this medicine. Avoid alcoholic drinks. You may have constipation. Try to have a bowel movement at least every 2 to 3 days. If you do not have a bowel movement for 3 days, call your doctor or health care professional. Your mouth may get dry. Chewing sugarless gum or sucking hard candy, and drinking plenty of water may help. Contact your doctor if the problem does not go away or is severe. What side effects may I notice from receiving this medicine? Side effects that you should report to your doctor or health care professional as soon as possible: -allergic reactions like skin rash, itching or hives, swelling of the face, lips, or tongue -breathing problems -confusion -seizures -signs and symptoms of low blood pressure like dizziness; feeling faint or lightheaded, falls; unusually weak or tired -trouble passing urine or change in the amount of urine Side effects that usually do not require medical attention (report to your doctor or health care professional if they continue or are bothersome): -constipation -dry  mouth -nausea, vomiting -tiredness This list may not describe all possible side effects. Call your doctor for medical advice about side effects. You may report side effects to FDA at 1-800-FDA-1088. Where should I keep my medicine? Keep out of the reach of children. This medicine may cause accidental overdose and death if it taken by other adults, children, or pets. Mix any unused medicine with a substance like cat litter or coffee grounds. Then throw the medicine away in a sealed container like a sealed bag or a coffee can with a lid. Do not use the medicine after the expiration date. Store at room temperature between 15 and 30 degrees C (59 and 86 degrees F). NOTE: This sheet is a summary. It may not cover all possible information. If you have questions about this medicine, talk to your doctor, pharmacist, or health care provider.  2018 Elsevier/Gold Standard (2014-10-17 09:00:04)  

## 2017-09-24 NOTE — Progress Notes (Signed)
Follow Up  Subjective:    Patient ID: Kathleen Herrera, female    DOB: 13-Oct-1960, 57 y.o.   MRN: [id number]   Chief Complaint  Patient presents with  . Neck Pain    HPI  [name] has a past medical history of PTSD, Hypertension, Hyperlipidemia, Fibromyalgia, Depression, Bronchitis, Bipolar Disorder, Ataxia, Asthma, and Arthritis. She is here today for follow up.   Current Status: Since her last office visit, she is doing well with c/o neck strain for at least one month. She has used Acetaminophen with minimal relief.   She denies fevers, chills, fatigue, recent infections, weight loss, and night sweats.   She has not had any headaches, visual changes, dizziness, and falls.   No chest pain, heart palpitations, cough and shortness of breath reported.   No reports of GI problems such as nausea, vomiting, diarrhea, and constipation. She has no reports of blood in stools, dysuria and hematuria.   No depression or anxiety reported.   She denies pain today.   Review of Systems  Constitutional: Negative.   HENT: Negative.   Eyes: Negative.   Respiratory: Negative.   Cardiovascular: Negative.   Gastrointestinal: Positive for abdominal distention (Obese).  Endocrine: Negative.   Genitourinary: Negative.   Musculoskeletal: Positive for arthralgias (Generalized joint pain).  Skin: Negative.   Allergic/Immunologic: Negative.   Psychiatric/Behavioral: Negative.    Objective:   Physical Exam  Constitutional: She is oriented to person, place, and time. She appears well-developed and well-nourished.  Cardiovascular: Normal rate, regular rhythm, normal heart sounds and intact distal pulses.  Pulmonary/Chest: Effort normal and breath sounds normal.  Abdominal: Soft. Bowel sounds are normal. She exhibits distension (Obese).  Neurological: She is alert and oriented to person, place, and time.  Skin: Skin is warm and dry. Capillary refill takes less than 2 seconds.  Psychiatric: She has  a normal mood and affect. Her behavior is normal. Judgment and thought content normal.   Assessment & Plan:   1. Acute strain of neck muscle, initial encounter Moderate pain today. We will refill Flexeril today to aide in minimizing neck pain.  - cyclobenzaprine (FLEXERIL) 10 MG tablet; Take 1 tablet (10 mg total) by mouth 3 (three) times daily as needed for muscle spasms.  Dispense: 30 tablet; Refill: 2  2. Pure hypercholesterolemia Continue Zocor as prescribed.  - simvastatin (ZOCOR) 20 MG tablet; TAKE ONE TABLET   (20 MG TOTAL ) BY MOUTH   AT BEDTIME  Dispense: 90 tablet; Refill: 3  3. Bilateral leg pain We will initiate trial of Tramadol today.  - traMADol (ULTRAM) 50 MG tablet; Take 1 tablet (50 mg total) by mouth every 6 (six) hours as needed.  Dispense: 30 tablet; Refill: 0  4. Need for immunization against influenza - Flu Vaccine QUAD 36+ mos IM  5. Follow up She will follow up in 6 months.   Meds ordered this encounter  Medications  . cyclobenzaprine (FLEXERIL) 10 MG tablet    Sig: Take 1 tablet (10 mg total) by mouth 3 (three) times daily as needed for muscle spasms.    Dispense:  30 tablet    Refill:  2  . pantoprazole (PROTONIX) 40 MG tablet    Sig: TAKE 1 TABLET (40 MG TOTAL) BY MOUTH TWO TIMES DAILY BEFORE A MEAL.    Dispense:  60 tablet    Refill:  6  . simvastatin (ZOCOR) 20 MG tablet    Sig: TAKE ONE TABLET   (20 MG TOTAL )  BY MOUTH   AT BEDTIME    Dispense:  90 tablet    Refill:  3  . traMADol (ULTRAM) 50 MG tablet    Sig: Take 1 tablet (50 mg total) by mouth every 6 (six) hours as needed.    Dispense:  30 tablet    Refill:  0   Raliegh IpNatalie Unknown Schleyer,  MSN, FNP-C Patient [facility] [address]  , [place] [id number] [phone]

## 2017-09-25 ENCOUNTER — Other Ambulatory Visit: Payer: Self-pay | Admitting: Family Medicine

## 2017-09-25 DIAGNOSIS — I10 Essential (primary) hypertension: Secondary | ICD-10-CM

## 2017-09-28 ENCOUNTER — Other Ambulatory Visit: Payer: Self-pay | Admitting: Family Medicine

## 2017-10-12 ENCOUNTER — Other Ambulatory Visit: Payer: Self-pay | Admitting: Family Medicine

## 2017-10-24 ENCOUNTER — Ambulatory Visit: Payer: Self-pay | Admitting: Family Medicine

## 2017-11-07 ENCOUNTER — Other Ambulatory Visit: Payer: Self-pay | Admitting: Family Medicine

## 2017-12-03 ENCOUNTER — Telehealth: Payer: Self-pay

## 2017-12-03 NOTE — Telephone Encounter (Signed)
Called, to remind of appointment tomorrow. Phone was busy

## 2017-12-04 ENCOUNTER — Ambulatory Visit: Payer: Self-pay | Admitting: Family Medicine

## 2017-12-11 ENCOUNTER — Other Ambulatory Visit: Payer: Self-pay | Admitting: Internal Medicine

## 2017-12-11 ENCOUNTER — Other Ambulatory Visit: Payer: Self-pay | Admitting: Family Medicine

## 2017-12-11 DIAGNOSIS — S161XXA Strain of muscle, fascia and tendon at neck level, initial encounter: Secondary | ICD-10-CM

## 2017-12-11 DIAGNOSIS — I10 Essential (primary) hypertension: Secondary | ICD-10-CM

## 2017-12-17 IMAGING — CT CT HEAD W/O CM
3 of 4 series · 16 of 47 positions shown, 19 images · non-contrast
Comparison: None.

CLINICAL DATA: Sudden onset of dizziness.

EXAM:
CT HEAD WITHOUT CONTRAST
TECHNIQUE: Contiguous axial images were obtained from the base of the skull
through the vertex without intravenous contrast.

[Series 2: head w/o · axial · non-contrast · 0.45mm/px · z∈[+1340,+1470]mm · 10 of 32 slices shown, 13 images]
[im 3/32  brain]
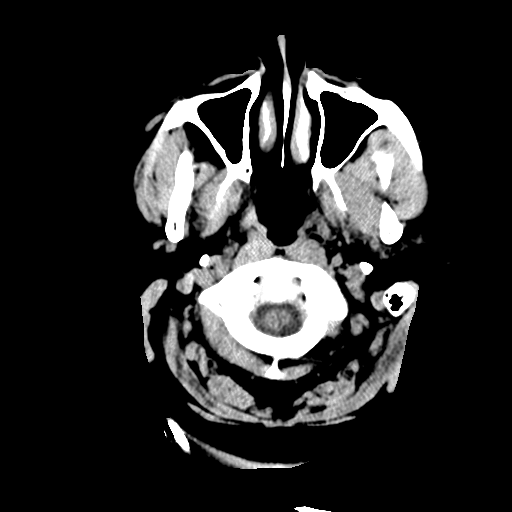
[im 3/32  bone]
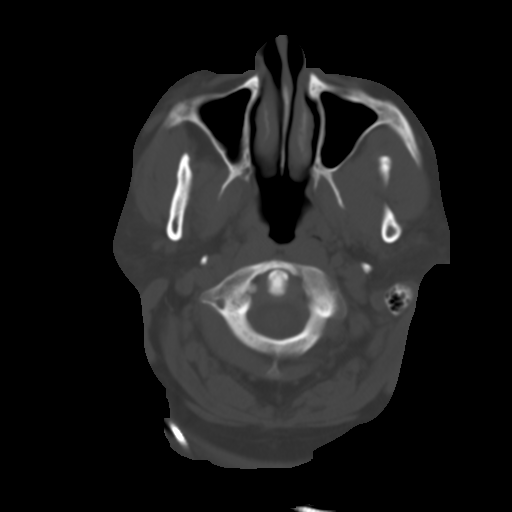
[im 5/32  brain]
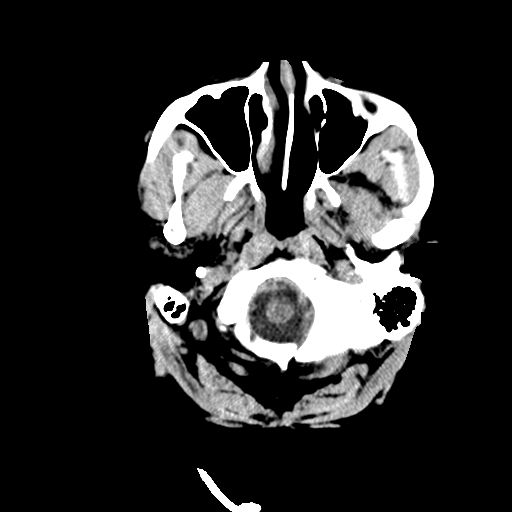
[im 9/32  brain]
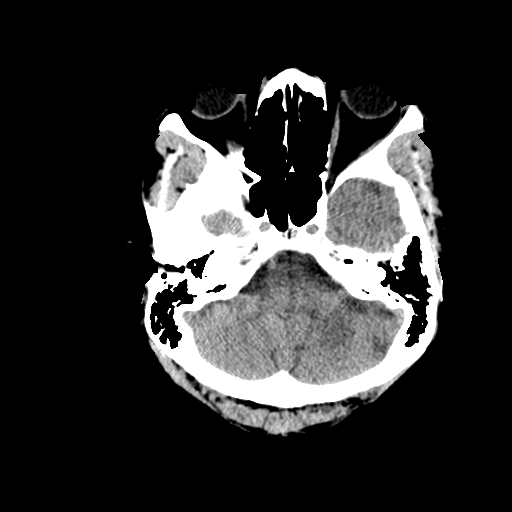
[im 12/32  brain]
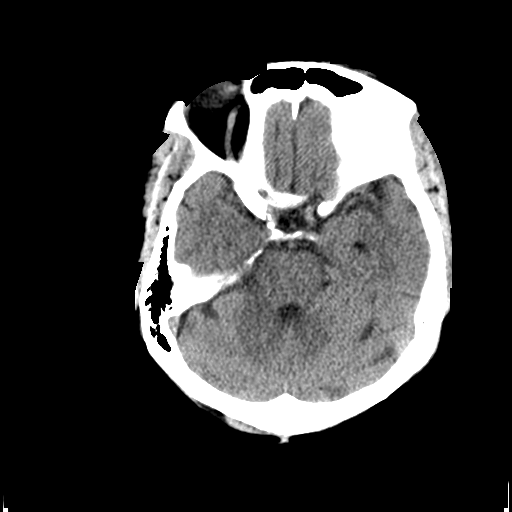
[im 14/32  brain]
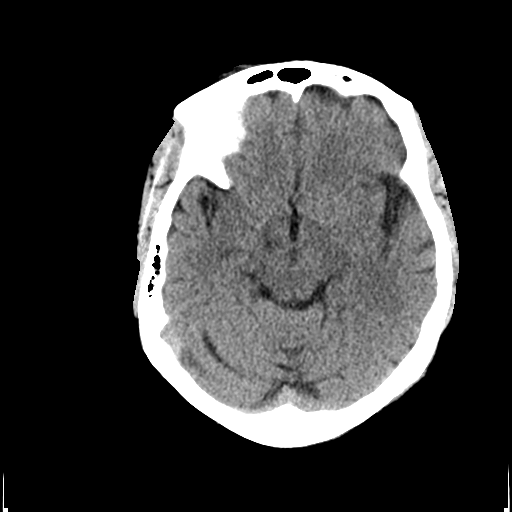
[im 14/32  bone]
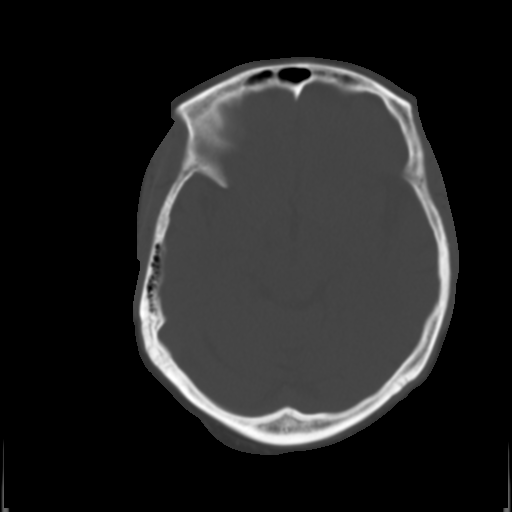
[im 18/32  brain]
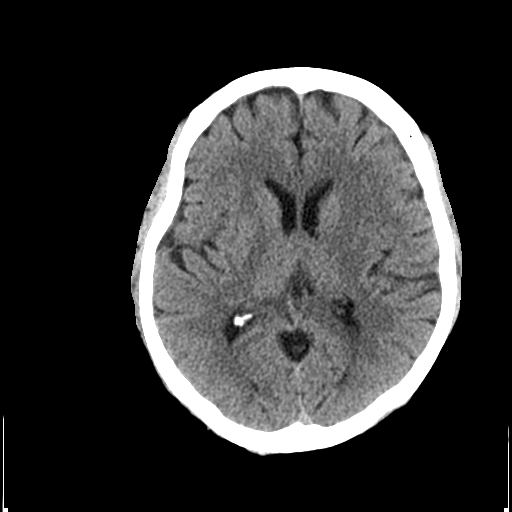
[im 20/32  brain]
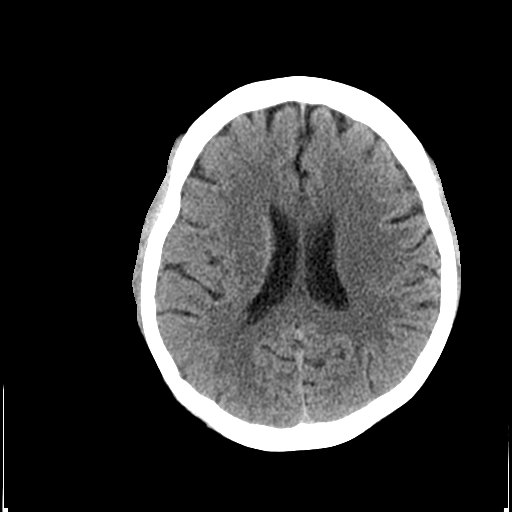
[im 23/32  brain]
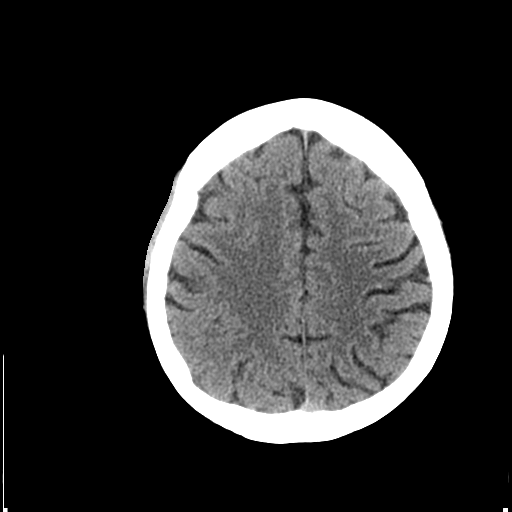
[im 27/32  brain]
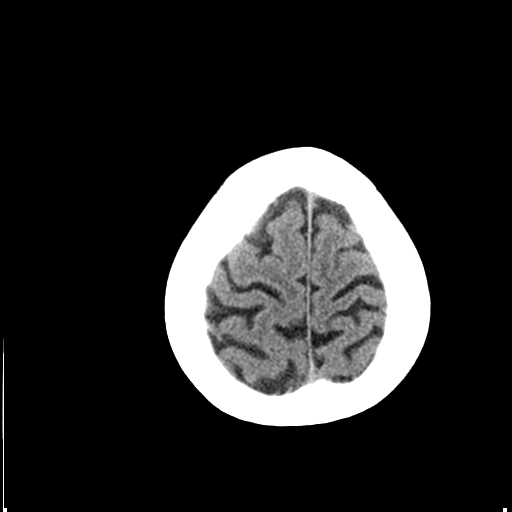
[im 27/32  bone]
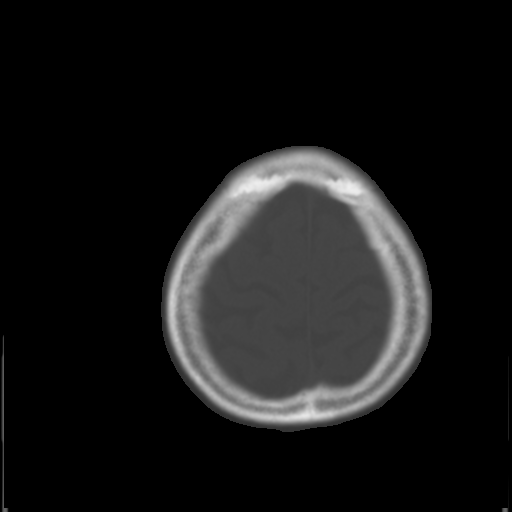
[im 29/32  brain]
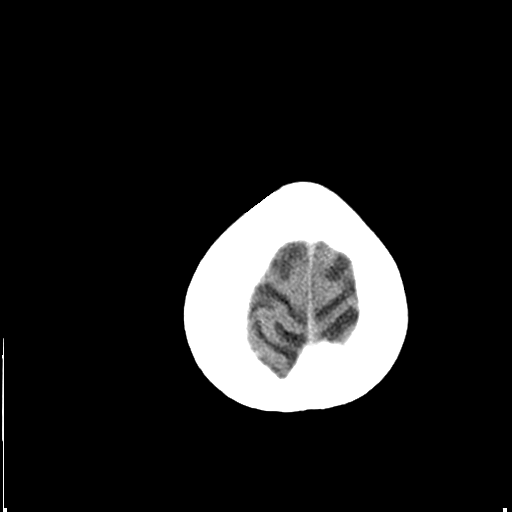

[Series 4: coronal · coronal · 0.29mm/px · 3 of 60 slices shown]
[im 20/60  brain]
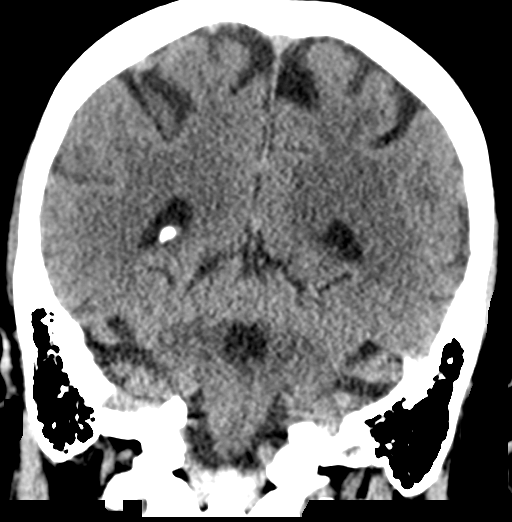
[im 27/60  brain]
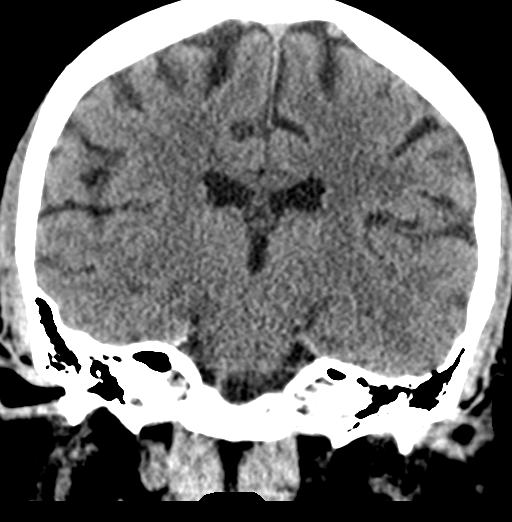
[im 33/60  brain]
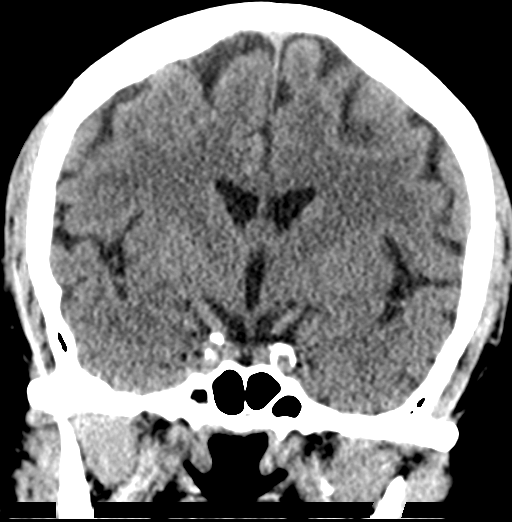

[Series 5: sagittal · sagittal · 0.32mm/px · 3 of 50 slices shown]
[im 17/50  brain]
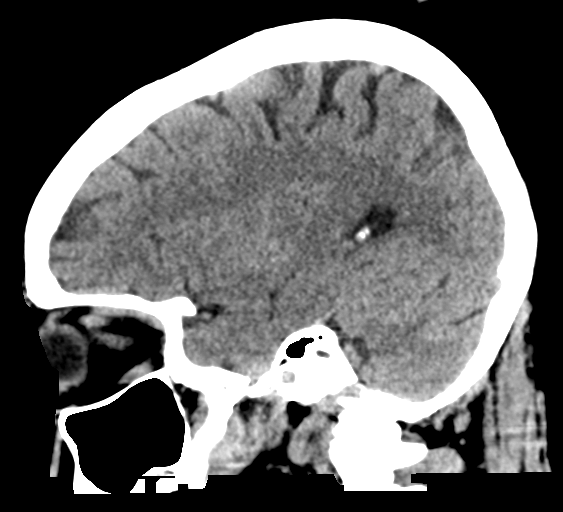
[im 25/50  brain]
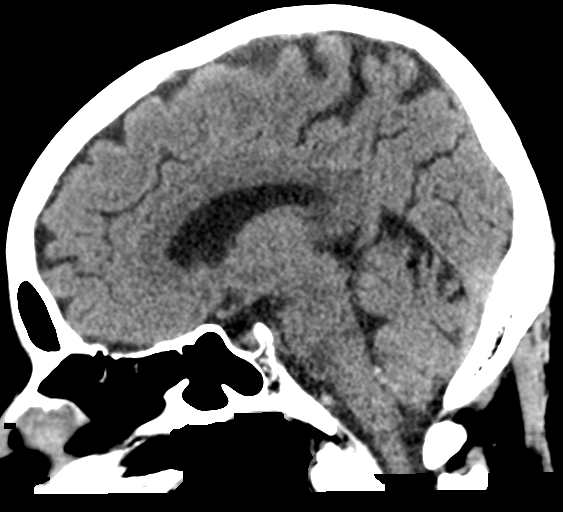
[im 33/50  brain]
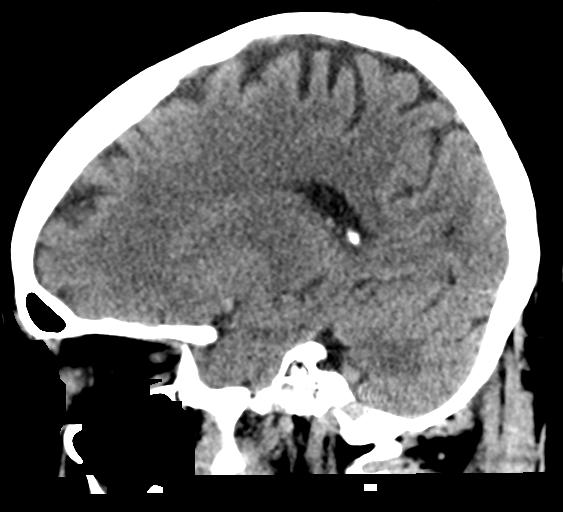

[16 of 47 positions shown; findings below may reference images not displayed]

FINDINGS: Brain: Brain volume is normal for age. No intracranial hemorrhage,
mass effect, or midline shift. No hydrocephalus. The basilar
cisterns are patent. No evidence of territorial infarct or acute
ischemia. No extra-axial or intracranial fluid collection.

Vascular: No hyperdense vessel or unexpected calcification.

Skull: No fracture or focal lesion.

Sinuses/Orbits: Paranasal sinuses and mastoid air cells are clear.
The visualized orbits are unremarkable.

Other: None.
IMPRESSION: No acute intracranial abnormality.

## 2017-12-26 ENCOUNTER — Emergency Department (HOSPITAL_COMMUNITY)
Admission: EM | Admit: 2017-12-26 | Discharge: 2017-12-26 | Disposition: A | Discharge status: Home / Self Care | Payer: Medicaid Other | Attending: Emergency Medicine | Admitting: Emergency Medicine

## 2017-12-26 ENCOUNTER — Other Ambulatory Visit: Payer: Self-pay

## 2017-12-26 ENCOUNTER — Encounter (HOSPITAL_COMMUNITY): Payer: Self-pay

## 2017-12-26 DIAGNOSIS — Z79899 Other long term (current) drug therapy: Secondary | ICD-10-CM | POA: Insufficient documentation

## 2017-12-26 DIAGNOSIS — H1131 Conjunctival hemorrhage, right eye: Secondary | ICD-10-CM

## 2017-12-26 DIAGNOSIS — S0511XA Contusion of eyeball and orbital tissues, right eye, initial encounter: Secondary | ICD-10-CM | POA: Diagnosis not present

## 2017-12-26 DIAGNOSIS — F17210 Nicotine dependence, cigarettes, uncomplicated: Secondary | ICD-10-CM | POA: Diagnosis not present

## 2017-12-26 DIAGNOSIS — E876 Hypokalemia: Secondary | ICD-10-CM

## 2017-12-26 DIAGNOSIS — Y999 Unspecified external cause status: Secondary | ICD-10-CM | POA: Insufficient documentation

## 2017-12-26 DIAGNOSIS — Y939 Activity, unspecified: Secondary | ICD-10-CM | POA: Insufficient documentation

## 2017-12-26 DIAGNOSIS — S01111A Laceration without foreign body of right eyelid and periocular area, initial encounter: Secondary | ICD-10-CM | POA: Diagnosis present

## 2017-12-26 DIAGNOSIS — R42 Dizziness and giddiness: Secondary | ICD-10-CM | POA: Diagnosis not present

## 2017-12-26 DIAGNOSIS — Y929 Unspecified place or not applicable: Secondary | ICD-10-CM | POA: Insufficient documentation

## 2017-12-26 DIAGNOSIS — Z23 Encounter for immunization: Secondary | ICD-10-CM | POA: Diagnosis not present

## 2017-12-26 DIAGNOSIS — I10 Essential (primary) hypertension: Secondary | ICD-10-CM | POA: Insufficient documentation

## 2017-12-26 DIAGNOSIS — W01190A Fall on same level from slipping, tripping and stumbling with subsequent striking against furniture, initial encounter: Secondary | ICD-10-CM | POA: Diagnosis not present

## 2017-12-26 DIAGNOSIS — F319 Bipolar disorder, unspecified: Secondary | ICD-10-CM | POA: Diagnosis not present

## 2017-12-26 LAB — CBC WITH DIFFERENTIAL/PLATELET
ABS IMMATURE GRANULOCYTES: 0.01 10*3/uL (ref 0.00–0.07)
BASOS ABS: 0.1 10*3/uL (ref 0.0–0.1)
BASOS PCT: 1 %
EOS ABS: 0.2 10*3/uL (ref 0.0–0.5)
Eosinophils Relative: 3 %
HCT: 48.1 % — ABNORMAL HIGH (ref 36.0–46.0)
Hemoglobin: 14.6 g/dL (ref 12.0–15.0)
IMMATURE GRANULOCYTES: 0 %
Lymphocytes Relative: 37 %
Lymphs Abs: 2.8 10*3/uL (ref 0.7–4.0)
MCH: 25.5 pg — ABNORMAL LOW (ref 26.0–34.0)
MCHC: 30.4 g/dL (ref 30.0–36.0)
MCV: 84.1 fL (ref 80.0–100.0)
MONOS PCT: 8 %
Monocytes Absolute: 0.6 10*3/uL (ref 0.1–1.0)
NEUTROS ABS: 3.8 10*3/uL (ref 1.7–7.7)
NEUTROS PCT: 51 %
NRBC: 0 % (ref 0.0–0.2)
PLATELETS: 266 10*3/uL (ref 150–400)
RBC: 5.72 MIL/uL — AB (ref 3.87–5.11)
RDW: 16.6 % — AB (ref 11.5–15.5)
WBC: 7.5 10*3/uL (ref 4.0–10.5)

## 2017-12-26 LAB — I-STAT CHEM 8, ED
BUN: 5 mg/dL — AB (ref 6–20)
CALCIUM ION: 1.16 mmol/L (ref 1.15–1.40)
CHLORIDE: 100 mmol/L (ref 98–111)
Creatinine, Ser: 0.8 mg/dL (ref 0.44–1.00)
GLUCOSE: 154 mg/dL — AB (ref 70–99)
HCT: 47 % — ABNORMAL HIGH (ref 36.0–46.0)
Hemoglobin: 16 g/dL — ABNORMAL HIGH (ref 12.0–15.0)
POTASSIUM: 2.9 mmol/L — AB (ref 3.5–5.1)
Sodium: 137 mmol/L (ref 135–145)
TCO2: 29 mmol/L (ref 22–32)

## 2017-12-26 MED ORDER — TETRACAINE HCL 0.5 % OP SOLN
2.0000 [drp] | Freq: Once | OPHTHALMIC | Status: AC
  Administered 2017-12-26: 2 [drp] via OPHTHALMIC
  Filled 2017-12-26: qty 4

## 2017-12-26 MED ORDER — IBUPROFEN 800 MG PO TABS
800.0000 mg | ORAL_TABLET | Freq: Once | ORAL | Status: DC
  Filled 2017-12-26: qty 1

## 2017-12-26 MED ORDER — FLUORESCEIN SODIUM 1 MG OP STRP
1.0000 | ORAL_STRIP | Freq: Once | OPHTHALMIC | Status: AC
  Administered 2017-12-26: 1 via OPHTHALMIC
  Filled 2017-12-26: qty 1

## 2017-12-26 MED ORDER — POTASSIUM CHLORIDE CRYS ER 20 MEQ PO TBCR: 20.0000 meq | EXTENDED_RELEASE_TABLET | Freq: Every day | ORAL | 0 refills | Status: AC

## 2017-12-26 MED ORDER — MECLIZINE HCL 25 MG PO TABS
25.0000 mg | ORAL_TABLET | Freq: Once | ORAL | Status: AC
  Administered 2017-12-26: 25 mg via ORAL
  Filled 2017-12-26: qty 1

## 2017-12-26 MED ORDER — POTASSIUM CHLORIDE 20 MEQ/15ML (10%) PO SOLN
40.0000 meq | Freq: Once | ORAL | Status: AC
  Administered 2017-12-26: 40 meq via ORAL
  Filled 2017-12-26: qty 30

## 2017-12-26 MED ORDER — MECLIZINE HCL 25 MG PO TABS: 25.0000 mg | ORAL_TABLET | Freq: Three times a day (TID) | ORAL | 0 refills | Status: DC | PRN

## 2017-12-26 MED ORDER — POTASSIUM CHLORIDE CRYS ER 20 MEQ PO TBCR: 40.0000 meq | EXTENDED_RELEASE_TABLET | Freq: Once | ORAL | Status: DC

## 2017-12-26 MED ORDER — TETANUS-DIPHTH-ACELL PERTUSSIS 5-2.5-18.5 LF-MCG/0.5 IM SUSP
0.5000 mL | Freq: Once | INTRAMUSCULAR | Status: AC
  Administered 2017-12-26: 0.5 mL via INTRAMUSCULAR
  Filled 2017-12-26: qty 0.5

## 2017-12-26 NOTE — ED Provider Notes (Signed)
[facility]-EMERGENCY DEPT Provider Note   CSN: [id number] Arrival date & time: 12/26/17  1744     History   Chief Complaint Chief Complaint  Patient presents with  . eye redness    HPI Kathleen Herrera is a 57 y.o. female.  The history is provided by the patient. No language interpreter was used.     57 year old female with history of fibromyalgia, bipolar, arthritis, ataxia presenting for evaluation of eye injury.  Patient report yesterday she was experiencing a bout of dizziness related to her vertigo.  She fell forward, striking her right side of face against a table.  She denies any loss of consciousness.  She did suffer a cut to her right eyebrow.  Today she noticed redness surrounding her eyes with concerns her.  She does complain of pain with eye movement and with blinking.  She states pain is moderate in severity, sharp, throbbing.  She is not up-to-date with tetanus.  She denies any diplopia or loss of vision.  She denies any nosebleed.  She denies any specific treatment tried.    Past Medical History:  Diagnosis Date  . Arthritis   . Asthma   . 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 Bipolar disorder (HCC)   . Bronchitis   . Depression   . Fibromyalgia Y-2  . Fibromyalgia unknown  . Hyperlipemia   . Hypertension   . Post traumatic stress disorder   . Sinus congestion     Patient Active Problem List   Diagnosis Date Noted  . Upper airway cough syndrome 06/21/2017  . Chronic obstructive pulmonary disease (HCC) 05/26/2017  . History of pneumonia 05/26/2017  . Generalized anxiety disorder 05/26/2017  . Bipolar 1 disorder, depressed, severe (HCC) 02/25/2017  . 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 Episodic recurrent vertigo   . Dermatitis   . Asthma   . CAP (community acquired pneumonia) 09/19/2015  . Dyspnea 09/19/2015  . Acute respiratory failure with hypoxia (HCC) 09/19/2015  . Abnormality of gait 06/29/2014  . Essential hypertension 06/29/2014  . Hyperlipidemia  06/29/2014  . Metabolic syndrome 06/29/2014  . Neuropathic pain 06/29/2014  . Gastroesophageal reflux disease without esophagitis 06/29/2014  . Tachycardia 06/29/2014  . At risk for osteopenia 06/29/2014  . Excessive daytime sleepiness 06/29/2014  . Fibromyalgia   . Abnormal LFTs 03/06/2013  . Morbid obesity due to excess calories (HCC) 12/18/2012  . Fatty infiltration of liver 07/15/2012  . Gonalgia 07/15/2012  . LBP (low back pain) 07/15/2012  . Arthritis, degenerative 06/12/2012  . Arthritis 05/26/2012  . Bipolar 2 disorder (HCC) 04/17/2012  . Clinical depression 04/17/2012    Past Surgical History:  Procedure Laterality Date  . ABDOMINAL HYSTERECTOMY       OB History   None      Home Medications    Prior to Admission medications   Medication Sig Start Date End Date Taking? Authorizing Provider  amLODipine (NORVASC) 10 MG tablet TAKE 1 TABLET (10 MG TOTAL) BY MOUTH DAILY. 09/26/17   Kallie LocksStroud, Natalie M, FNP  beta carotene 30 MG capsule Take 30 mg by mouth daily.    [provider]  clotrimazole-betamethasone (LOTRISONE) cream APPLY 1 APPLICATION TOPICALLY TWO (TWO) TIMES DAILY. 06/25/17   Massie MaroonHollis, Lachina M, FNP  cyclobenzaprine (FLEXERIL) 10 MG tablet Take 1 tablet (10 mg total) by mouth 3 (three) times daily as needed for muscle spasms. 09/24/17   Kallie LocksStroud, Natalie M, FNP  furosemide (LASIX) 20 MG tablet TAKE 1 TABLET (20 MG TOTAL) BY MOUTH DAILY. 08/12/17  Quentin Angst, MD  gabapentin (NEURONTIN) 300 MG capsule TAKE 1 CAPSULE (300 MG TOTAL) BY MOUTH THREE TIMES DAILY. 06/06/17   Quentin Angst, MD  GNP ASPIRIN 325 MG tablet TAKE 1 TABLET (325 MG TOTAL) BY MOUTH DAILY. Patient not taking: Reported on 09/24/2017 06/06/17   Quentin Angst, MD  hydrocortisone 2.5 % cream Apply topically 2 (two) times daily. Patient not taking: Reported on 09/24/2017 06/13/17   Massie Maroon, FNP  hydrOXYzine (ATARAX/VISTARIL) 25 MG tablet Take 1 tablet (25 mg total) by  mouth 3 (three) times daily as needed for itching or anxiety. 06/13/17   Massie Maroon, FNP  lamoTRIgine (LAMICTAL) 25 MG tablet Take 1 tablet (25 mg total) by mouth 2 (two) times daily. For mood control Patient taking differently: Take 25 mg by mouth 3 (three) times daily. For mood control 03/01/17   Money, Gerlene Burdock, FNP  lithium carbonate 300 MG capsule Take 1 capsule (300 mg total) by mouth every morning. For mood control 03/02/17   Money, Gerlene Burdock, FNP  lithium carbonate 600 MG capsule Take 1 capsule (600 mg total) by mouth at bedtime. For mood control 03/01/17   Money, Gerlene Burdock, FNP  meclizine (ANTIVERT) 12.5 MG tablet TAKE ONE TABLET BY MOUTH TWICE A DAY AS NEEDED FOR DIZZINESS 08/13/17   Kallie Locks, FNP  metoprolol succinate (TOPROL-XL) 50 MG 24 hr tablet Take 2 tablets (100 mg total) by mouth daily. Take with or immediately following a meal. 04/23/17   Bing Neighbors, FNP  nystatin (MYCOSTATIN/NYSTOP) powder APPLY TWO SPRINKLES OF POWDER TO THE AFFECTED INNER THIGHS TO PREVENT ITCHING AND FUNGAL RASH 04/25/17   Bing Neighbors, FNP  pantoprazole (PROTONIX) 40 MG tablet TAKE 1 TABLET (40 MG TOTAL) BY MOUTH TWO TIMES DAILY BEFORE A MEAL. 09/24/17   Kallie Locks, FNP  PARoxetine (PAXIL) 30 MG tablet Take 30 mg by mouth every morning.    [provider]  PROAIR HFA [id number] [phone] Base) MCG/ACT inhaler INHALE TWO PUFFS INTO THE LUNGS EVERY 6 HOURS AS NEEDED FOR WHEEZING OR SHORTNESS OF BREATH. 07/26/17   Kallie Locks, FNP  simvastatin (ZOCOR) 20 MG tablet TAKE ONE TABLET   (20 MG TOTAL ) BY MOUTH   AT BEDTIME 09/24/17   Kallie Locks, FNP  SYMBICORT 80-4.5 MCG/ACT inhaler INHALE TWO PUFFS INTO THE LUNGS TWO TIMES DAILY. 08/13/17   Kallie Locks, FNP  traMADol (ULTRAM) 50 MG tablet Take 1 tablet (50 mg total) by mouth every 6 (six) hours as needed. 09/24/17   Kallie Locks, FNP  triamcinolone cream (KENALOG) 0.1 % Apply 1 application topically 2 (two) times daily. 07/08/17    Kallie Locks, FNP  VESICARE 5 MG tablet TAKE 1 TABLET (5 MG TOTAL) BY MOUTH DAILY. 08/15/17   Quentin Angst, MD    Family History Family History  Problem Relation Age of Onset  . Dementia Mother   . Prostate cancer Father   . Dementia Father   . Prostate cancer Other   . CAD Other     Social History Social History   Tobacco Use  . Smoking status: Current Every Day Smoker    Packs/day: 0.25    Years: 37.00    Pack years: 9.25    Types: Cigarettes  . Smokeless tobacco: Never Used  Substance Use Topics  . Alcohol use: Yes    Comment: occasionally  . Drug use: No    Comment: recovering crack addict  Allergies   Patient has no known allergies.   Review of Systems Review of Systems  Constitutional: Negative for fever.  Eyes: Positive for pain and redness. Negative for photophobia and visual disturbance.  Skin: Positive for wound.  Neurological: Negative for numbness and headaches.     Physical Exam Updated Vital Signs BP (!) 136/105 (BP Location: Left Arm) Comment: Took BP meds today  Pulse (!) 57   Temp 98.5 F (36.9 C) (Oral)   Resp 16   Ht 5\' 3"  (1.6 m)   Wt 99.8 kg   SpO2 99%   BMI 38.97 kg/m   Physical Exam  Constitutional: She appears well-developed and well-nourished. No distress.  HENT:  Head: Atraumatic.  1 cm superficial laceration noted to right eyebrow laterally not actively bleeding.  No foreign body noted.  Is mildly tender to palpation.  No hemotympanum, no septal hematoma, no malocclusion, no midface tenderness, no raccoon's eyes, no battle sign.  Eyes: Pupils are equal, round, and reactive to light. EOM are normal. Lids are everted and swept, no foreign bodies found. Right eye exhibits no chemosis, no discharge, no exudate and no hordeolum. No foreign body present in the right eye. Right conjunctiva is injected. Right conjunctiva has a hemorrhage. Left conjunctiva is not injected. Left conjunctiva has no hemorrhage.  Slit lamp  exam:      The right eye shows no corneal abrasion, no corneal flare, no corneal ulcer, no foreign body, no hyphema, no hypopyon, no fluorescein uptake and no anterior chamber bulge.  Visual Acuity Bilateral Near  20/50         Bilateral Distance  -         R Near  20/70         R Distance  -         L Near  20/70          Neck: Normal range of motion. Neck supple.  Neurological: She is alert.  Skin: No rash noted.  Psychiatric: She has a normal mood and affect.  Nursing note and vitals reviewed.    ED Treatments / Results  Labs (all labs ordered are listed, but only abnormal results are displayed) Labs Reviewed  CBC WITH DIFFERENTIAL/PLATELET - Abnormal; Notable for the following components:      Result Value   RBC 5.72 (*)    HCT 48.1 (*)    MCH 25.5 (*)    RDW 16.6 (*)    All other components within normal limits  I-STAT CHEM 8, ED - Abnormal; Notable for the following components:   Potassium 2.9 (*)    BUN 5 (*)    Glucose, Bld 154 (*)    Hemoglobin 16.0 (*)    HCT 47.0 (*)    All other components within normal limits    EKG None  Radiology No results found.  Procedures Procedures (including critical care time)  Medications Ordered in ED Medications  fluorescein ophthalmic strip 1 strip (1 strip Both Eyes Given 12/26/17 1834)  tetracaine (PONTOCAINE) 0.5 % ophthalmic solution 2 drop (2 drops Right Eye Given 12/26/17 1834)  Tdap (BOOSTRIX) injection 0.5 mL (0.5 mLs Intramuscular Given 12/26/17 1859)  potassium chloride 20 MEQ/15ML (10%) solution 40 mEq (40 mEq Oral Given 12/26/17 2030)  meclizine (ANTIVERT) tablet 25 mg (25 mg Oral Given 12/26/17 2030)     Initial Impression / Assessment and Plan / ED Course  I have reviewed the triage vital signs and the nursing notes.  Pertinent labs &  imaging results that were available during my care of the patient were reviewed by me and considered in my medical decision making (see chart for details).     BP  120/71   Pulse (!) 54   Temp 98.5 F (36.9 C) (Oral)   Resp 18   Ht 5\' 3"  (1.6 m)   Wt 99.8 kg   SpO2 90%   BMI 38.97 kg/m    Final Clinical Impressions(s) / ED Diagnoses   Final diagnoses:  Laceration of right eyebrow, initial encounter  Subconjunctival hemorrhage, right  Hypokalemia    ED Discharge Orders         Ordered    meclizine (ANTIVERT) 25 MG tablet  3 times daily PRN     12/26/17 2006    potassium chloride SA (K-DUR,KLOR-CON) 20 MEQ tablet  Daily     12/26/17 2006         6:45 PM Patient presents with concerns of right eye redness after she fell and struck her face against the corner of a table yesterday afternoon.  She does have a superficial laceration above her right eyebrow laterally.  She has evidence of some conjunctival hemorrhage involving the right eye.  No evidence of hyphema, no chemosis, no significant vision changes, no impingements of extraocular movement.  Her pupils equal round and reactive.  No fluorescein stain uptake and negative Seidel sign.  Laceration is not amenable for repair as it has been for more than 24 hours and has scabbed up.  We will update her tetanus status.  Will check visual acuity.  Low suspicion for orbital floor fracture, uveitis, acute angle glaucoma, or other acute ocular emergency.  7:16 PM Visual acuity performed without concerning finding.  Patient given reassurance.  Ophthalmology referral given as needed.  Plan to discharge.  Dr. Adriana Simas has seen and evaluated pt and recommend basic labs as screening tools.  Will obtain CBC and Istat chem 8.  K+ is low at 2.9, supplementation given. Pt otherwise stable for discharge.    Fayrene Helper, PA-C 12/27/17 1504    Donnetta Hutching, MD 12/28/17 [phone]

## 2017-12-26 NOTE — ED Notes (Signed)
Pts employer called earlier today saying the patient told him she was here yesterday and didn't get a note.  He spoke to medical records who advised him there was no encounter for yesterday.

## 2017-12-26 NOTE — Discharge Instructions (Signed)
You have been evaluated for your eye redness.  This is due to blood vessels ruptured.  It will resolve over time.  Your potassium level is low today.  Take supplementation prescribed or eat a banana daily and have it recheck in 1 week.  Take tylenol or ibuprofen as needed for headache.  Return if you have any concerns.

## 2017-12-26 NOTE — ED Triage Notes (Signed)
Patient states she was having vertigo yesterday and fell hitting her right eye on the corner of the table. Patient has a small laceration of the eyebrow area and right eye redness. patient has sensitivity to light.

## 2017-12-31 IMAGING — CR DG FEMUR 2+V*R*
4 series · 4 of 4 positions shown · non-contrast
Comparison: None.

CLINICAL DATA: Status post fall, with right upper thigh pain.
Initial encounter.

EXAM:
RIGHT FEMUR 2 VIEWS

[x femur proximal ap right (1 of 3)]
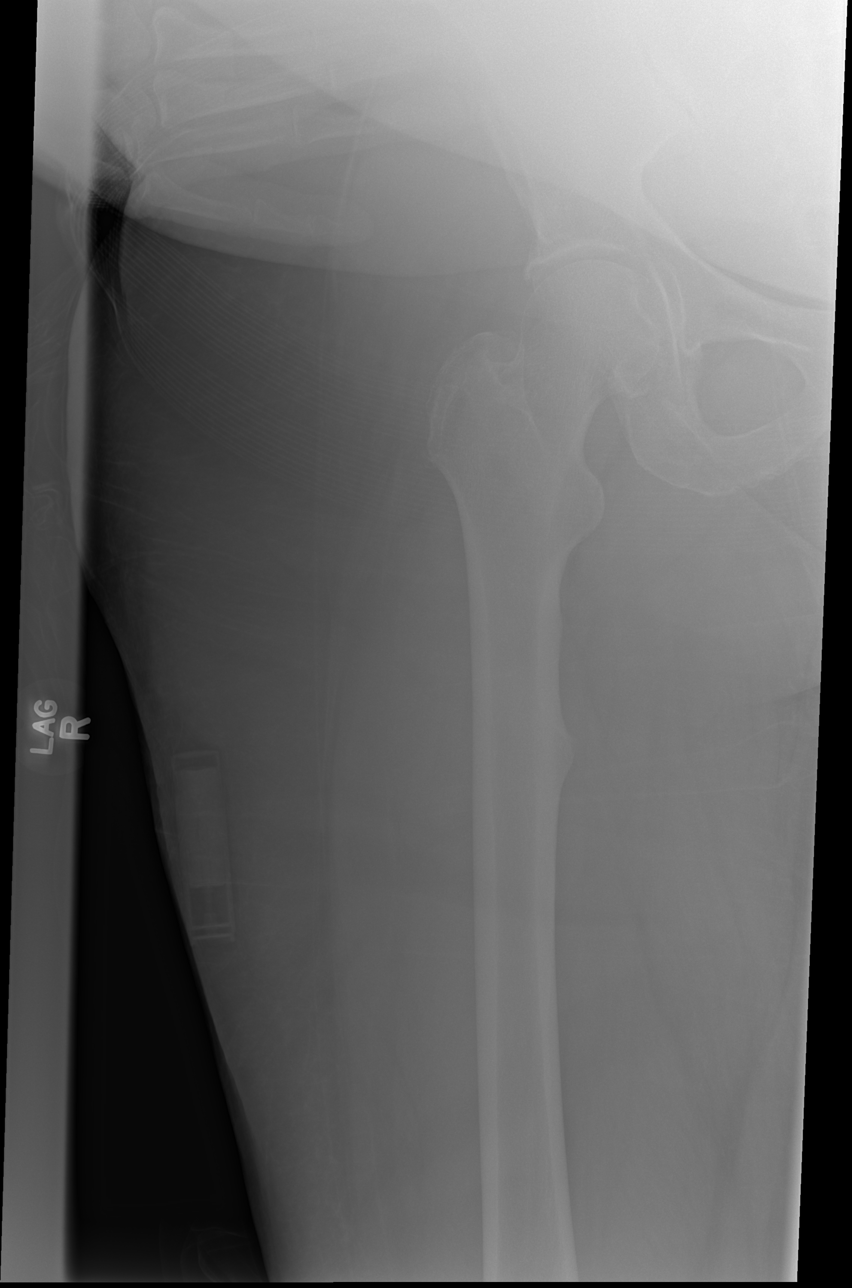

[x femur proximal ap right (2 of 3)]
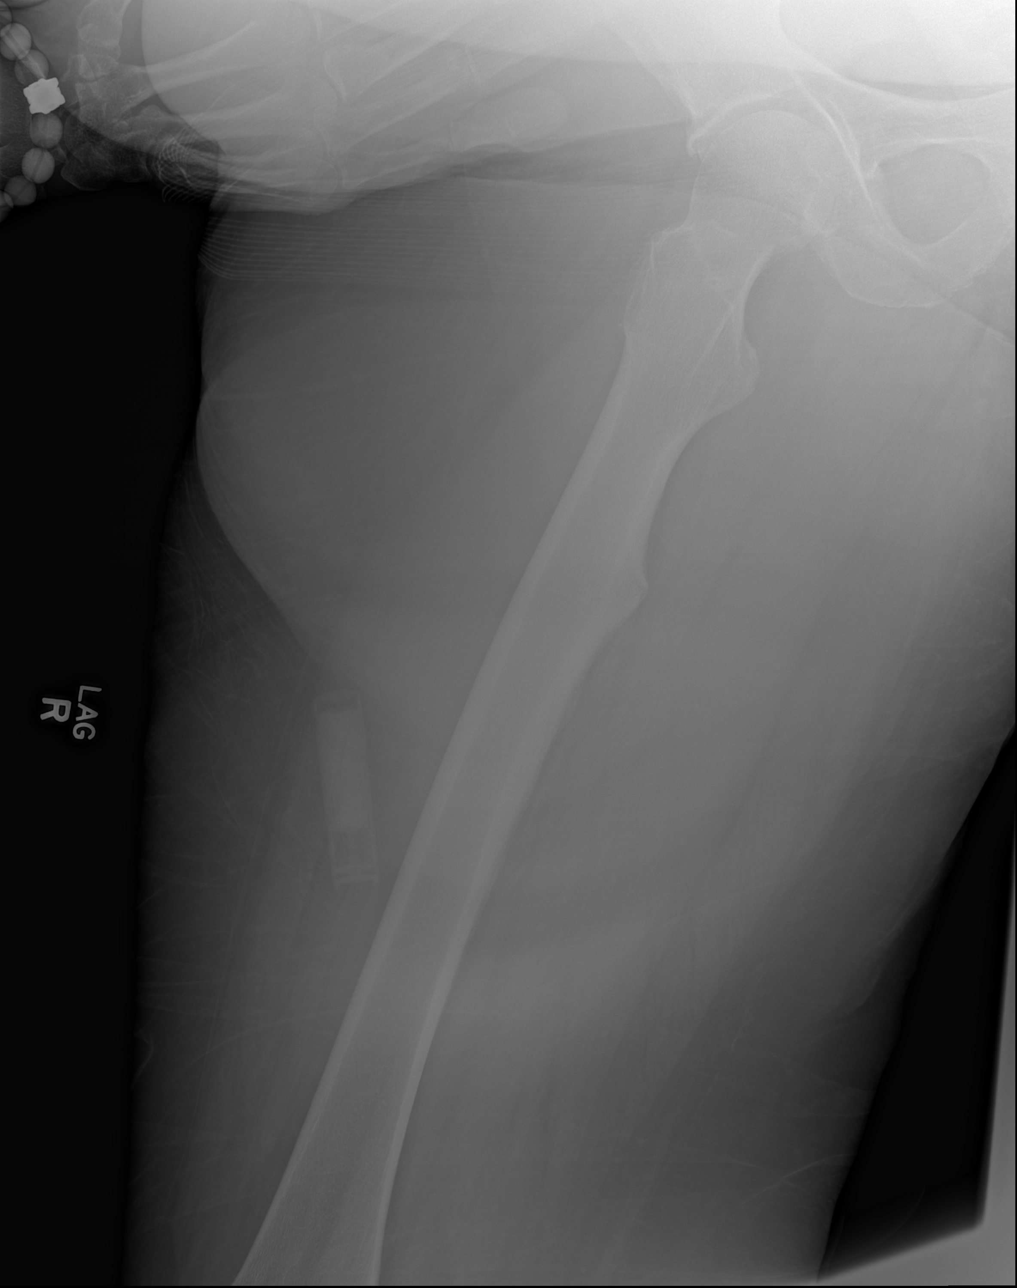

[x femur distal lat right]
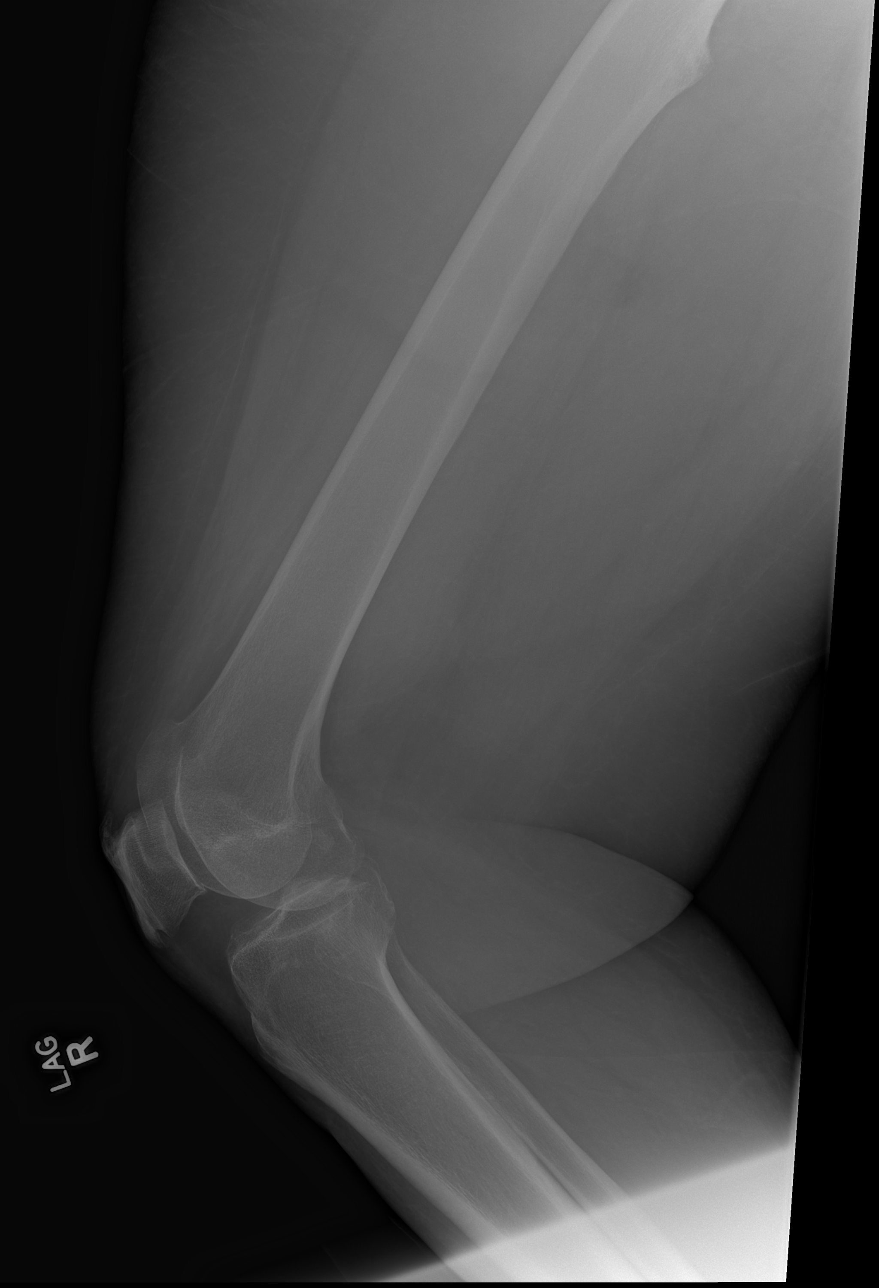

[x femur proximal ap right (3 of 3)]
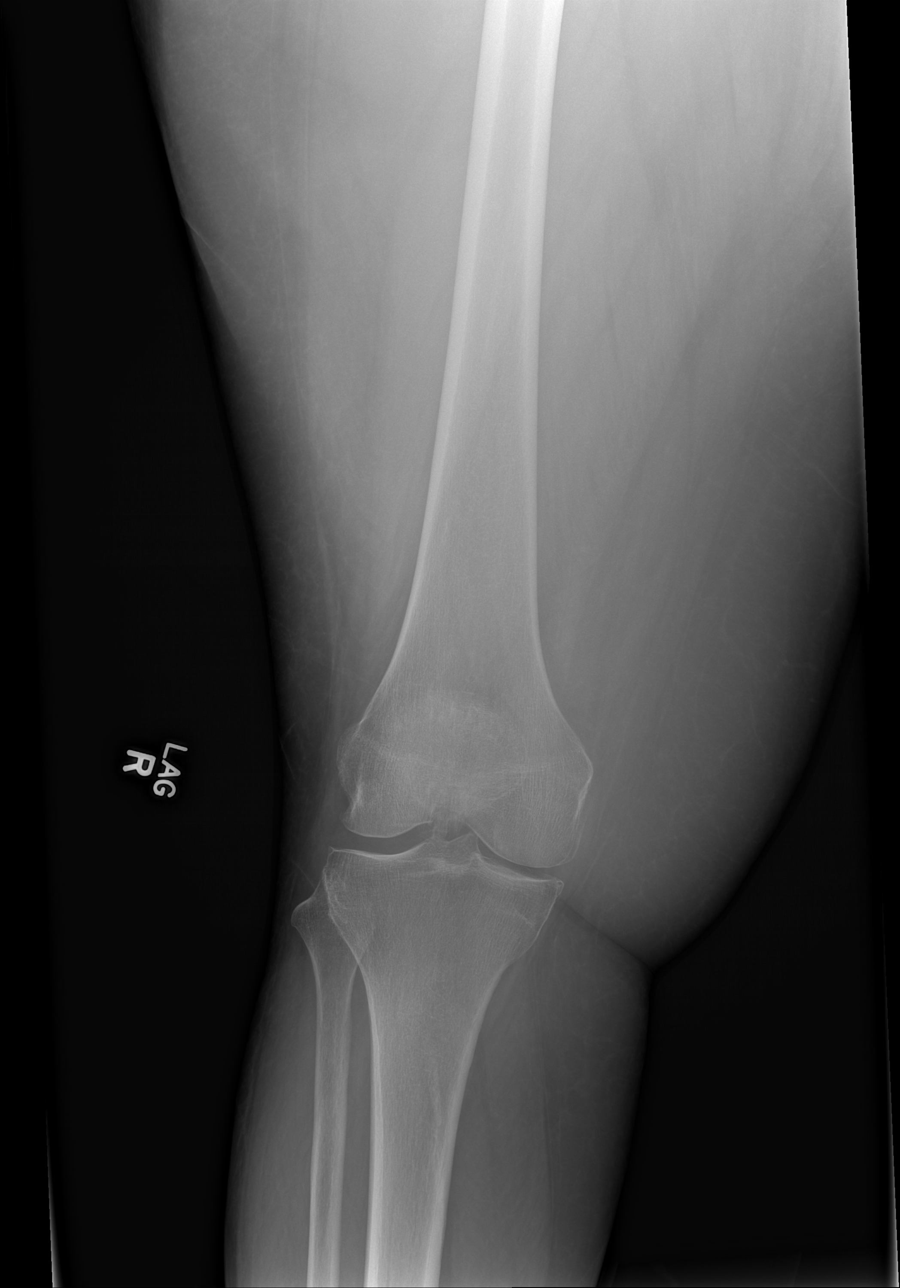

[4 of 4 positions shown; findings below may reference images not displayed]

FINDINGS: There is no evidence of fracture or dislocation. The right femur
appears intact. The right femoral head remains seated at the
acetabulum.

No significant soft tissue abnormalities are characterized on
radiograph. A rectangular density overlying the right upper thigh is
thought to be outside the patient. No knee joint effusion is seen.
IMPRESSION: No evidence of fracture or dislocation.

## 2018-01-20 ENCOUNTER — Other Ambulatory Visit: Payer: Self-pay

## 2018-01-20 ENCOUNTER — Emergency Department (HOSPITAL_COMMUNITY)
Admission: EM | Admit: 2018-01-20 | Discharge: 2018-01-20 | Disposition: A | Discharge status: Home / Self Care | Payer: Medicaid Other | Attending: Emergency Medicine | Admitting: Emergency Medicine

## 2018-01-20 ENCOUNTER — Encounter (HOSPITAL_COMMUNITY): Payer: Self-pay | Admitting: Emergency Medicine

## 2018-01-20 ENCOUNTER — Emergency Department (HOSPITAL_COMMUNITY): Payer: Medicaid Other

## 2018-01-20 DIAGNOSIS — Z79899 Other long term (current) drug therapy: Secondary | ICD-10-CM | POA: Diagnosis not present

## 2018-01-20 DIAGNOSIS — F17210 Nicotine dependence, cigarettes, uncomplicated: Secondary | ICD-10-CM | POA: Diagnosis not present

## 2018-01-20 DIAGNOSIS — I10 Essential (primary) hypertension: Secondary | ICD-10-CM | POA: Diagnosis not present

## 2018-01-20 DIAGNOSIS — R05 Cough: Secondary | ICD-10-CM | POA: Diagnosis present

## 2018-01-20 DIAGNOSIS — J209 Acute bronchitis, unspecified: Secondary | ICD-10-CM | POA: Diagnosis not present

## 2018-01-20 LAB — CBC WITH DIFFERENTIAL/PLATELET
Abs Immature Granulocytes: 0.02 10*3/uL (ref 0.00–0.07)
Basophils Absolute: 0 10*3/uL (ref 0.0–0.1)
Basophils Relative: 1 %
Eosinophils Absolute: 0.1 10*3/uL (ref 0.0–0.5)
Eosinophils Relative: 1 %
HCT: 44.1 % (ref 36.0–46.0)
Hemoglobin: 13.2 g/dL (ref 12.0–15.0)
Immature Granulocytes: 0 %
Lymphocytes Relative: 26 %
Lymphs Abs: 2 10*3/uL (ref 0.7–4.0)
MCH: 25.9 pg — ABNORMAL LOW (ref 26.0–34.0)
MCHC: 29.9 g/dL — ABNORMAL LOW (ref 30.0–36.0)
MCV: 86.6 fL (ref 80.0–100.0)
Monocytes Absolute: 0.5 10*3/uL (ref 0.1–1.0)
Monocytes Relative: 6 %
Neutro Abs: 5 10*3/uL (ref 1.7–7.7)
Neutrophils Relative %: 66 %
Platelets: 280 10*3/uL (ref 150–400)
RBC: 5.09 MIL/uL (ref 3.87–5.11)
RDW: 17.2 % — ABNORMAL HIGH (ref 11.5–15.5)
WBC: 7.7 10*3/uL (ref 4.0–10.5)
nRBC: 0 % (ref 0.0–0.2)

## 2018-01-20 LAB — URINALYSIS, ROUTINE W REFLEX MICROSCOPIC
Bilirubin Urine: NEGATIVE
Glucose, UA: NEGATIVE mg/dL
Ketones, ur: NEGATIVE mg/dL
Nitrite: NEGATIVE
Protein, ur: NEGATIVE mg/dL
Specific Gravity, Urine: 1.004 — ABNORMAL LOW (ref 1.005–1.030)
pH: 8 (ref 5.0–8.0)

## 2018-01-20 LAB — BASIC METABOLIC PANEL
Anion gap: 11 (ref 5–15)
BUN: <5 mg/dL — ABNORMAL LOW (ref 6–20)
CO2: 25 mmol/L (ref 22–32)
Calcium: 8.9 mg/dL (ref 8.9–10.3)
Chloride: 105 mmol/L (ref 98–111)
Creatinine, Ser: 0.75 mg/dL (ref 0.44–1.00)
GFR calc Af Amer: >60 mL/min (ref >60)
GFR calc non Af Amer: >60 mL/min (ref >60)
Glucose, Bld: 189 mg/dL — ABNORMAL HIGH (ref 70–99)
Potassium: 3.4 mmol/L — ABNORMAL LOW (ref 3.5–5.1)
Sodium: 141 mmol/L (ref 135–145)

## 2018-01-20 LAB — I-STAT TROPONIN, ED: Troponin i, poc: 0 ng/mL (ref 0.00–0.08)

## 2018-01-20 MED ORDER — PROMETHAZINE-DM 6.25-15 MG/5ML PO SYRP: 5.0000 mL | ORAL_SOLUTION | Freq: Four times a day (QID) | ORAL | 0 refills | Status: DC | PRN

## 2018-01-20 MED ORDER — BENZONATATE 100 MG PO CAPS: 100.0000 mg | ORAL_CAPSULE | Freq: Three times a day (TID) | ORAL | 0 refills | Status: DC

## 2018-01-20 MED ORDER — DOXYCYCLINE HYCLATE 100 MG PO CAPS: 100.0000 mg | ORAL_CAPSULE | Freq: Two times a day (BID) | ORAL | 0 refills | Status: DC

## 2018-01-20 MED ORDER — GUAIFENESIN ER 1200 MG PO TB12: 1.0000 | ORAL_TABLET | Freq: Two times a day (BID) | ORAL | 0 refills | Status: DC

## 2018-01-20 NOTE — Patient Outreach (Signed)
ED Peer Support Specialist Patient Intake (Complete at intake & 30-60 Day Follow-up)  Name: Kathleen Herrera  MRN: [id number]  Age: 57 y.o.   Date of Admission: 01/20/2018  Intake: Initial Comments:      Primary Reason Admitted: Substance Abuse.  Lab values: Alcohol/ETOH: Not completed Positive UDS? No Amphetamines: No Barbiturates: No Benzodiazepines: No Cocaine: Yes Opiates: No Cannabinoids: No  Demographic information: Gender: Female Ethnicity: African American Marital Status: Single Insurance Status: Medicaid Ecologist (Work Neurosurgeon, Physicist, medical, Social research officer, government.: Yes([employer]) Lives with: Alone Living situation: House/Apartment  Reported Patient History: Patient reported health conditions: Bipolar disorder, Anxiety disorders(PTSD) Patient aware of HIV and hepatitis status: No  In past year, has patient visited ED for any reason? No  Number of ED visits:    Reason(s) for visit:    In past year, has patient been hospitalized for any reason? No  Number of hospitalizations:    Reason(s) for hospitalization:    In past year, has patient been arrested? No  Number of arrests:    Reason(s) for arrest:    In past year, has patient been incarcerated? No  Number of incarcerations:    Reason(s) for incarceration:    In past year, has patient received medication-assisted treatment? No  In past year, patient received the following treatments: Individual therapy([facility])  In past year, has patient received any harm reduction services? No  Did this include any of the following?    In past year, has patient received care from a mental health provider for diagnosis other than SUD? No  In past year, is this first time patient has overdosed? No  Number of past overdoses:    In past year, is this first time patient has been hospitalized for an overdose? No  Number of hospitalizations for overdose(s):    Is patient currently receiving  treatment for a mental health diagnosis? No  Patient reports experiencing difficulty participating in SUD treatment: No    Most important reason(s) for this difficulty?    Has patient received prior services for treatment? Yes  In past, patient has received services from following agencies: [facility] [employer] [facility])  Plan of Care:  Suggested follow up at these agencies/treatment centers: Day Treatment Program(Wants to received substance Abuse services )  Other information: [facility]PSS met with Pt and was able to gain information to better assist Pt. Pt stated that she has been involved in [facility] services and since they have closed she has been using substances and wants to seek help. CPSS was able to use Substance based recovery services. CPSS praised Pt for addressing her concerns and wanting to change her ways to better the quality of life. CPSS made Pt aware that there is services that Pt can get involved in, an CPSS gave Pt resource information for a facility that will offer the services that Pt is seeking. CPSS left contact information for Pt to stay in touch.    Aaron Edelman Zyaire Mccleod, CPSS  01/20/2018 3:23 PM

## 2018-01-20 NOTE — ED Triage Notes (Addendum)
Pt complaint productive cough and "green" diarrhea over past week; no appetite; "craving cold stuff." Hx of pneumonia.

## 2018-01-20 NOTE — Discharge Instructions (Addendum)
Return here as needed.  Follow-up with your primary doctor.  Your testing here today did not show any heart related damage.  I do feel that she will need follow-up by your doctor for this.

## 2018-01-20 NOTE — ED Provider Notes (Signed)
[facility]-EMERGENCY DEPT Provider Note   CSN: [id number] Arrival date & time: 01/20/18  1003     History   Chief Complaint Chief Complaint  Patient presents with  . Cough  . Diarrhea    HPI Kathleen Herrera is a 57 y.o. female.  HPI Patient presents to the emergency department with cough and she is also had diarrhea over the past week.  The patient states the cough is been productive.  She states that she is also had chest over the last few months patient states the chest pain does not go away totally.  Patient also states that she has had problems with crack addiction.  The patient states that she is unsure if she has had any fever.  The patient denies chest pain, shortness of breath, headache,blurred vision, neck pain, fever, weakness, numbness, dizziness, anorexia, edema, abdominal pain, nausea, vomiting, diarrhea, rash, back pain, dysuria, hematemesis, bloody stool, near syncope, or syncope. Past Medical History:  Diagnosis Date  . Arthritis   . Asthma   . 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 Bipolar disorder (HCC)   . Bronchitis   . Depression   . Fibromyalgia Y-2  . Fibromyalgia unknown  . Hyperlipemia   . Hypertension   . Post traumatic stress disorder   . Sinus congestion     Patient Active Problem List   Diagnosis Date Noted  . Upper airway cough syndrome 06/21/2017  . Chronic obstructive pulmonary disease (HCC) 05/26/2017  . History of pneumonia 05/26/2017  . Generalized anxiety disorder 05/26/2017  . Bipolar 1 disorder, depressed, severe (HCC) 02/25/2017  . 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 Episodic recurrent vertigo   . Dermatitis   . Asthma   . CAP (community acquired pneumonia) 09/19/2015  . Dyspnea 09/19/2015  . Acute respiratory failure with hypoxia (HCC) 09/19/2015  . Abnormality of gait 06/29/2014  . Essential hypertension 06/29/2014  . Hyperlipidemia 06/29/2014  . Metabolic syndrome 06/29/2014  . Neuropathic pain 06/29/2014  . Gastroesophageal  reflux disease without esophagitis 06/29/2014  . Tachycardia 06/29/2014  . At risk for osteopenia 06/29/2014  . Excessive daytime sleepiness 06/29/2014  . Fibromyalgia   . Abnormal LFTs 03/06/2013  . Morbid obesity due to excess calories (HCC) 12/18/2012  . Fatty infiltration of liver 07/15/2012  . Gonalgia 07/15/2012  . LBP (low back pain) 07/15/2012  . Arthritis, degenerative 06/12/2012  . Arthritis 05/26/2012  . Bipolar 2 disorder (HCC) 04/17/2012  . Clinical depression 04/17/2012    Past Surgical History:  Procedure Laterality Date  . ABDOMINAL HYSTERECTOMY       OB History   No obstetric history on file.      Home Medications    Prior to Admission medications   Medication Sig Start Date End Date Taking? Authorizing Provider  amLODipine (NORVASC) 10 MG tablet TAKE 1 TABLET (10 MG TOTAL) BY MOUTH DAILY. 09/26/17   Kallie LocksStroud, Natalie M, FNP  busPIRone (BUSPAR) 15 MG tablet Take 15 mg by mouth 3 (three) times daily.    [provider]  clotrimazole-betamethasone (LOTRISONE) cream APPLY 1 APPLICATION TOPICALLY TWO (TWO) TIMES DAILY. Patient taking differently: Apply 1 application topically 2 (two) times daily as needed (fungal infections).  06/25/17   Massie MaroonHollis, Lachina M, FNP  furosemide (LASIX) 20 MG tablet TAKE 1 TABLET (20 MG TOTAL) BY MOUTH DAILY. 08/12/17   Quentin AngstJegede, Olugbemiga E, MD  gabapentin (NEURONTIN) 300 MG capsule TAKE 1 CAPSULE (300 MG TOTAL) BY MOUTH THREE TIMES DAILY. Patient taking differently: Take 300 mg by mouth 3 (  three) times daily.  06/06/17   Quentin Angst, MD  GNP ASPIRIN 325 MG tablet TAKE 1 TABLET (325 MG TOTAL) BY MOUTH DAILY. Patient taking differently: Take 325 mg by mouth daily.  06/06/17   Quentin Angst, MD  hydrOXYzine (ATARAX/VISTARIL) 25 MG tablet Take 1 tablet (25 mg total) by mouth 3 (three) times daily as needed for itching or anxiety. Patient taking differently: Take 25 mg by mouth daily.  06/13/17   Massie Maroon, FNP    lamoTRIgine (LAMICTAL) 25 MG tablet Take 1 tablet (25 mg total) by mouth 2 (two) times daily. For mood control 03/01/17   Money, Gerlene Burdock, FNP  lithium carbonate 300 MG capsule Take 1 capsule (300 mg total) by mouth every morning. For mood control Patient taking differently: Take 300-600 mg by mouth See admin instructions. Take 300 mg by mouth in the morning and 600 mg by mouth at bedtime 03/02/17   Money, Gerlene Burdock, FNP  meclizine (ANTIVERT) 25 MG tablet Take 1 tablet (25 mg total) by mouth 3 (three) times daily as needed for dizziness. 12/26/17   Fayrene Helper, PA-C  metoprolol succinate (TOPROL-XL) 50 MG 24 hr tablet Take 2 tablets (100 mg total) by mouth daily. Take with or immediately following a meal. 04/23/17   Bing Neighbors, FNP  nystatin (MYCOSTATIN/NYSTOP) powder APPLY TWO SPRINKLES OF POWDER TO THE AFFECTED INNER THIGHS TO PREVENT ITCHING AND FUNGAL RASH Patient taking differently: Apply 1 g topically daily as needed (itching). APPLY TWO SPRINKLES OF POWDER TO THE AFFECTED INNER THIGHS TO PREVENT ITCHING AND FUNGAL RASH 04/25/17   Bing Neighbors, FNP  pantoprazole (PROTONIX) 40 MG tablet TAKE 1 TABLET (40 MG TOTAL) BY MOUTH TWO TIMES DAILY BEFORE A MEAL. Patient taking differently: Take 40 mg by mouth 2 (two) times daily before a meal.  09/24/17   Kallie Locks, FNP  PARoxetine (PAXIL) 30 MG tablet Take 30 mg by mouth every morning.    [provider]  potassium chloride SA (K-DUR,KLOR-CON) 20 MEQ tablet Take 1 tablet (20 mEq total) by mouth daily. 12/26/17   Fayrene Helper, PA-C  PROAIR HFA 108 (90 Base) MCG/ACT inhaler INHALE TWO PUFFS INTO THE LUNGS EVERY 6 HOURS AS NEEDED FOR WHEEZING OR SHORTNESS OF BREATH. Patient taking differently: Inhale 2 puffs into the lungs every 6 (six) hours as needed for wheezing or shortness of breath.  07/26/17   Kallie Locks, FNP  simvastatin (ZOCOR) 20 MG tablet TAKE ONE TABLET   (20 MG TOTAL ) BY MOUTH   AT BEDTIME Patient taking  differently: Take 20 mg by mouth at bedtime.  09/24/17   Kallie Locks, FNP  SYMBICORT 80-4.5 MCG/ACT inhaler INHALE TWO PUFFS INTO THE LUNGS TWO TIMES DAILY. Patient taking differently: Inhale 2 puffs into the lungs 2 (two) times daily.  08/13/17   Kallie Locks, FNP  VESICARE 5 MG tablet TAKE 1 TABLET (5 MG TOTAL) BY MOUTH DAILY. Patient taking differently: Take 5 mg by mouth daily.  08/15/17   Quentin Angst, MD    Family History Family History  Problem Relation Age of Onset  . Dementia Mother   . Prostate cancer Father   . Dementia Father   . Prostate cancer Other   . CAD Other     Social History Social History   Tobacco Use  . Smoking status: Current Every Day Smoker    Packs/day: 0.25    Years: 37.00    Pack years: 9.25  Types: Cigarettes  . Smokeless tobacco: Never Used  Substance Use Topics  . Alcohol use: Yes    Comment: occasionally  . Drug use: No    Comment: recovering crack addict     Allergies   Patient has no known allergies.   Review of Systems Review of Systems All other systems negative except as documented in the HPI. All pertinent positives and negatives as reviewed in the HPI.  Physical Exam Updated Vital Signs BP 118/73   Pulse (!) 58   Temp 99 F (37.2 C) (Oral)   Resp 20   Ht 5\' 3"  (1.6 m)   Wt 98.9 kg   SpO2 97%   BMI 38.62 kg/m   Physical Exam Vitals signs and nursing note reviewed.  Constitutional:      General: She is not in acute distress.    Appearance: She is well-developed.  HENT:     Head: Normocephalic and atraumatic.  Eyes:     Pupils: Pupils are equal, round, and reactive to light.  Neck:     Musculoskeletal: Normal range of motion and neck supple.  Cardiovascular:     Rate and Rhythm: Normal rate and regular rhythm.     Heart sounds: Normal heart sounds. No murmur. No friction rub. No gallop.   Pulmonary:     Effort: Pulmonary effort is normal. No respiratory distress.     Breath sounds: Normal  breath sounds. No wheezing.  Abdominal:     Palpations: Abdomen is soft.  Skin:    General: Skin is warm and dry.     Capillary Refill: Capillary refill takes less than 2 seconds.     Findings: No erythema or rash.  Neurological:     Mental Status: She is alert and oriented to person, place, and time.     Motor: No abnormal muscle tone.     Coordination: Coordination normal.  Psychiatric:        Behavior: Behavior normal.      ED Treatments / Results  Labs (all labs ordered are listed, but only abnormal results are displayed) Labs Reviewed  BASIC METABOLIC PANEL - Abnormal; Notable for the following components:      Result Value   Potassium 3.4 (*)    Glucose, Bld 189 (*)    BUN <5 (*)    All other components within normal limits  CBC WITH DIFFERENTIAL/PLATELET - Abnormal; Notable for the following components:   MCH 25.9 (*)    MCHC 29.9 (*)    RDW 17.2 (*)    All other components within normal limits  URINALYSIS, ROUTINE W REFLEX MICROSCOPIC - Abnormal; Notable for the following components:   Specific Gravity, Urine 1.004 (*)    Hgb urine dipstick SMALL (*)    Leukocytes, UA MODERATE (*)    Bacteria, UA RARE (*)    All other components within normal limits  I-STAT TROPONIN, ED    EKG EKG Interpretation  Date/Time:  Monday January 20 2018 12:28:14 EST Ventricular Rate:  55 PR Interval:    QRS Duration: 135 QT Interval:  476 QTC Calculation: 456 R Axis:   37 Text Interpretation:  Sinus rhythm Nonspecific intraventricular conduction delay similar to Feb 2019 Confirmed by Pricilla Loveless [phone]) on 01/20/2018 12:39:38 PM   Radiology Dg Chest 2 View  Result Date: 01/20/2018 CLINICAL DATA:  Productive cough EXAM: CHEST - 2 VIEW COMPARISON:  05/20/2017 FINDINGS: Normal heart size and mediastinal contours. No acute infiltrate or edema. No effusion or pneumothorax. No acute  osseous findings. IMPRESSION: Negative chest. Electronically Signed   By: Marnee Spring  M.D.   On: 01/20/2018 11:22    Procedures Procedures (including critical care time)  Medications Ordered in ED Medications - No data to display   Initial Impression / Assessment and Plan / ED Course  I have reviewed the triage vital signs and the nursing notes.  Pertinent labs & imaging results that were available during my care of the patient were reviewed by me and considered in my medical decision making (see chart for details).     I had the peer advocate speak with the patient about her addiction.  The patient will be treated for upper respiratory infection.  Told her return here as needed.  The patient is stable at this time.  I do not feel that her chest pain is classic cardiac type chest pain.  It has been constant over the last 2+ months.  I advised patient that if any changes occur to return here.  Have also advised her to follow-up with her primary doctor.  Final Clinical Impressions(s) / ED Diagnoses   Final diagnoses:  None    ED Discharge Orders    None       Charlestine Night, PA-C 01/20/18 1525    Pricilla Loveless, MD 01/20/18 1556

## 2018-01-20 NOTE — ED Notes (Signed)
[facility] has been called to transport patient home. [facility] is en route.

## 2018-01-27 ENCOUNTER — Other Ambulatory Visit: Payer: Self-pay | Admitting: Family Medicine

## 2018-01-27 ENCOUNTER — Other Ambulatory Visit: Payer: Self-pay | Admitting: Internal Medicine

## 2018-01-27 DIAGNOSIS — I10 Essential (primary) hypertension: Secondary | ICD-10-CM

## 2018-03-21 ENCOUNTER — Ambulatory Visit (INDEPENDENT_AMBULATORY_CARE_PROVIDER_SITE_OTHER): Payer: Medicaid Other | Admitting: Family Medicine

## 2018-03-21 VITALS — BP 124/78 | HR 60 | Temp 100.5°F | Ht 63.0 in | Wt 202.6 lb

## 2018-03-21 DIAGNOSIS — R6889 Other general symptoms and signs: Secondary | ICD-10-CM

## 2018-03-21 DIAGNOSIS — Z09 Encounter for follow-up examination after completed treatment for conditions other than malignant neoplasm: Secondary | ICD-10-CM | POA: Diagnosis not present

## 2018-03-21 DIAGNOSIS — I10 Essential (primary) hypertension: Secondary | ICD-10-CM

## 2018-03-21 DIAGNOSIS — N750 Cyst of Bartholin's gland: Secondary | ICD-10-CM

## 2018-03-21 DIAGNOSIS — M792 Neuralgia and neuritis, unspecified: Secondary | ICD-10-CM

## 2018-03-21 DIAGNOSIS — F411 Generalized anxiety disorder: Secondary | ICD-10-CM

## 2018-03-21 LAB — POCT RAPID STREP A (OFFICE): Rapid Strep A Screen: NEGATIVE

## 2018-03-21 LAB — POCT INFLUENZA A/B
Influenza A, POC: NEGATIVE
Influenza B, POC: NEGATIVE

## 2018-03-21 MED ORDER — GABAPENTIN 300 MG PO CAPS: ORAL_CAPSULE | ORAL | 3 refills | Status: DC

## 2018-03-21 MED ORDER — AMOXICILLIN-POT CLAVULANATE 875-125 MG PO TABS: 1.0000 | ORAL_TABLET | Freq: Two times a day (BID) | ORAL | 0 refills | Status: AC

## 2018-03-21 NOTE — Patient Instructions (Signed)
Gabapentin capsules or tablets What is this medicine? GABAPENTIN (GA ba pen tin) is used to control seizures in certain types of epilepsy. It is also used to treat certain types of nerve pain. This medicine may be used for other purposes; ask your health care provider or pharmacist if you have questions. COMMON BRAND NAME(S): Active-PAC with Gabapentin, Gabarone, Neurontin What should I tell my health care provider before I take this medicine? They need to know if you have any of these conditions: -kidney disease -suicidal thoughts, plans, or attempt; a previous suicide attempt by you or a family member -an unusual or allergic reaction to gabapentin, other medicines, foods, dyes, or preservatives -pregnant or trying to get pregnant -breast-feeding How should I use this medicine? Take this medicine by mouth with a glass of water. Follow the directions on the prescription label. You can take it with or without food. If it upsets your stomach, take it with food. Take your medicine at regular intervals. Do not take it more often than directed. Do not stop taking except on your doctor's advice. If you are directed to break the 600 or 800 mg tablets in half as part of your dose, the extra half tablet should be used for the next dose. If you have not used the extra half tablet within 28 days, it should be thrown away. A special MedGuide will be given to you by the pharmacist with each prescription and refill. Be sure to read this information carefully each time. Talk to your pediatrician regarding the use of this medicine in children. While this drug may be prescribed for children as young as 3 years for selected conditions, precautions do apply. Overdosage: If you think you have taken too much of this medicine contact a poison control center or emergency room at once. NOTE: This medicine is only for you. Do not share this medicine with others. What if I miss a dose? If you miss a dose, take it as soon  as you can. If it is almost time for your next dose, take only that dose. Do not take double or extra doses. What may interact with this medicine? Do not take this medicine with any of the following medications: -other gabapentin products This medicine may also interact with the following medications: -alcohol -antacids -antihistamines for allergy, cough and cold -certain medicines for anxiety or sleep -certain medicines for depression or psychotic disturbances -homatropine; hydrocodone -naproxen -narcotic medicines (opiates) for pain -phenothiazines like chlorpromazine, mesoridazine, prochlorperazine, thioridazine This list may not describe all possible interactions. Give your health care provider a list of all the medicines, herbs, non-prescription drugs, or dietary supplements you use. Also tell them if you smoke, drink alcohol, or use illegal drugs. Some items may interact with your medicine. What should I watch for while using this medicine? Visit your doctor or health care professional for regular checks on your progress. You may want to keep a record at home of how you feel your condition is responding to treatment. You may want to share this information with your doctor or health care professional at each visit. You should contact your doctor or health care professional if your seizures get worse or if you have any new types of seizures. Do not stop taking this medicine or any of your seizure medicines unless instructed by your doctor or health care professional. Stopping your medicine suddenly can increase your seizures or their severity. Wear a medical identification bracelet or chain if you are taking this medicine for   seizures, and carry a card that lists all your medications. You may get drowsy, dizzy, or have blurred vision. Do not drive, use machinery, or do anything that needs mental alertness until you know how this medicine affects you. To reduce dizzy or fainting spells, do not  sit or stand up quickly, especially if you are an older patient. Alcohol can increase drowsiness and dizziness. Avoid alcoholic drinks. Your mouth may get dry. Chewing sugarless gum or sucking hard candy, and drinking plenty of water will help. The use of this medicine may increase the chance of suicidal thoughts or actions. Pay special attention to how you are responding while on this medicine. Any worsening of mood, or thoughts of suicide or dying should be reported to your health care professional right away. Women who become pregnant while using this medicine may enroll in the [place] Pregnancy Registry by calling [id number]. This registry collects information about the safety of antiepileptic drug use during pregnancy. What side effects may I notice from receiving this medicine? Side effects that you should report to your doctor or health care professional as soon as possible: -allergic reactions like skin rash, itching or hives, swelling of the face, lips, or tongue -worsening of mood, thoughts or actions of suicide or dying Side effects that usually do not require medical attention (report to your doctor or health care professional if they continue or are bothersome): -constipation -difficulty walking or controlling muscle movements -dizziness -nausea -slurred speech -tiredness -tremors -weight gain This list may not describe all possible side effects. Call your doctor for medical advice about side effects. You may report side effects to FDA at 1-800-FDA-1088. Where should I keep my medicine? Keep out of reach of children. This medicine may cause accidental overdose and death if it taken by other adults, children, or pets. Mix any unused medicine with a substance like cat litter or coffee grounds. Then throw the medicine away in a sealed container like a sealed bag or a coffee can with a lid. Do not use the medicine after the expiration date. Store at room  temperature between 15 and 30 degrees C (59 and 86 degrees F). NOTE: This sheet is a summary. It may not cover all possible information. If you have questions about this medicine, talk to your doctor, pharmacist, or health care provider.  2019 Elsevier/Gold Standard (2017-06-27 13:21:44) Peripheral Neuropathy Peripheral neuropathy is a type of nerve damage. It affects nerves that carry signals between the spinal cord and the arms, legs, and the rest of the body (peripheral nerves). It does not affect nerves in the spinal cord or brain. In peripheral neuropathy, one nerve or a group of nerves may be damaged. Peripheral neuropathy is a broad category that includes many specific nerve disorders, like diabetic neuropathy, hereditary neuropathy, and carpal tunnel syndrome. What are the causes? This condition may be caused by:  Diabetes. This is the most common cause of peripheral neuropathy.  Nerve injury.  Pressure or stress on a nerve that lasts a long time.  Lack (deficiency) of B vitamins. This can result from alcoholism, poor diet, or a restricted diet.  Infections.  Autoimmune diseases, such as rheumatoid arthritis and systemic lupus erythematosus.  Nerve diseases that are passed from parent to child (inherited).  Some medicines, such as cancer medicines (chemotherapy).  Poisonous (toxic) substances, such as lead and mercury.  Too little blood flowing to the legs.  Kidney disease.  Thyroid disease. In some cases, the cause of this condition  is not known. What are the signs or symptoms? Symptoms of this condition depend on which of your nerves is damaged. Common symptoms include:  Loss of feeling (numbness) in the feet, hands, or both.  Tingling in the feet, hands, or both.  Burning pain.  Very sensitive skin.  Weakness.  Not being able to move a part of the body (paralysis).  Muscle twitching.  Clumsiness or poor coordination.  Loss of balance.  Not being able  to control your bladder.  Feeling dizzy.  Sexual problems. How is this diagnosed? Diagnosing and finding the cause of peripheral neuropathy can be difficult. Your health care provider will take your medical history and do a physical exam. A neurological exam will also be done. This involves checking things that are affected by your brain, spinal cord, and nerves (nervous system). For example, your health care provider will check your reflexes, how you move, and what you can feel. You may have other tests, such as:  Blood tests.  Electromyogram (EMG) and nerve conduction tests. These tests check nerve function and how well the nerves are controlling the muscles.  Imaging tests, such as CT scans or MRI to rule out other causes of your symptoms.  Removing a small piece of nerve to be examined in a lab (nerve biopsy). This is rare.  Removing and examining a small amount of the fluid that surrounds the brain and spinal cord (lumbar puncture). This is rare. How is this treated? Treatment for this condition may involve:  Treating the underlying cause of the neuropathy, such as diabetes, kidney disease, or vitamin deficiencies.  Stopping medicines that can cause neuropathy, such as chemotherapy.  Medicine to relieve pain. Medicines may include: ? Prescription or over-the-counter pain medicine. ? Antiseizure medicine. ? Antidepressants. ? Pain-relieving patches that are applied to painful areas of skin.  Surgery to relieve pressure on a nerve or to destroy a nerve that is causing pain.  Physical therapy to help improve movement and balance.  Devices to help you move around (assistive devices). Follow these instructions at home: Medicines  Take over-the-counter and prescription medicines only as told by your health care provider. Do not take any other medicines without first asking your health care provider.  Do not drive or use heavy machinery while taking prescription pain  medicine. Lifestyle   Do not use any products that contain nicotine or tobacco, such as cigarettes and e-cigarettes. Smoking keeps blood from reaching damaged nerves. If you need help quitting, ask your health care provider.  Avoid or limit alcohol. Too much alcohol can cause a vitamin B deficiency, and vitamin B is needed for healthy nerves.  Eat a healthy diet. This includes: ? Eating foods that are high in fiber, such as fresh fruits and vegetables, whole grains, and beans. ? Limiting foods that are high in fat and processed sugars, such as fried or sweet foods. General instructions   If you have diabetes, work closely with your health care provider to keep your blood sugar under control.  If you have numbness in your feet: ? Check every day for signs of injury or infection. Watch for redness, warmth, and swelling. ? Wear padded socks and comfortable shoes. These help protect your feet.  Develop a good support system. Living with peripheral neuropathy can be stressful. Consider talking with a mental health specialist or joining a support group.  Use assistive devices and attend physical therapy as told by your health care provider. This may include using a walker  or a cane.  Keep all follow-up visits as told by your health care provider. This is important. Contact a health care provider if:  You have new signs or symptoms of peripheral neuropathy.  You are struggling emotionally from dealing with peripheral neuropathy.  Your pain is not well-controlled. Get help right away if:  You have an injury or infection that is not healing normally.  You develop new weakness in an arm or leg.  You fall frequently. Summary  Peripheral neuropathy is when the nerves in the arms, or legs are damaged, resulting in numbness, weakness, or pain.  There are many causes of peripheral neuropathy, including diabetes, pinched nerves, vitamin deficiencies, autoimmune disease, and hereditary  conditions.  Diagnosing and finding the cause of peripheral neuropathy can be difficult. Your health care provider will take your medical history, do a physical exam, and do tests, including blood tests and nerve function tests.  Treatment involves treating the underlying cause of the neuropathy and taking medicines to help control pain. Physical therapy and assistive devices may also help. This information is not intended to replace advice given to you by your health care provider. Make sure you discuss any questions you have with your health care provider. Document Released: 01/12/2002 Document Revised: 04/02/2016 Document Reviewed: 04/02/2016 Elsevier Interactive Patient Education  2019 [employer] Amoxicillin; Clavulanic Acid tablets What is this medicine? AMOXICILLIN; CLAVULANIC ACID (a mox i SIL in; KLAV yoo lan ic AS id) is a penicillin antibiotic. It is used to treat certain kinds of bacterial infections. It will not work for colds, flu, or other viral infections. This medicine may be used for other purposes; ask your health care provider or pharmacist if you have questions. COMMON BRAND NAME(S): Augmentin What should I tell my health care provider before I take this medicine? They need to know if you have any of these conditions: -bowel disease, like colitis -kidney disease -liver disease -mononucleosis -an unusual or allergic reaction to amoxicillin, penicillin, cephalosporin, other antibiotics, clavulanic acid, other medicines, foods, dyes, or preservatives -pregnant or trying to get pregnant -breast-feeding How should I use this medicine? Take this medicine by mouth with a full glass of water. Follow the directions on the prescription label. Take at the start of a meal. Do not crush or chew. If the tablet has a score line, you may cut it in half at the score line for easier swallowing. Take your medicine at regular intervals. Do not take your medicine more often than directed.  Take all of your medicine as directed even if you think you are better. Do not skip doses or stop your medicine early. Talk to your pediatrician regarding the use of this medicine in children. Special care may be needed. Overdosage: If you think you have taken too much of this medicine contact a poison control center or emergency room at once. NOTE: This medicine is only for you. Do not share this medicine with others. What if I miss a dose? If you miss a dose, take it as soon as you can. If it is almost time for your next dose, take only that dose. Do not take double or extra doses. What may interact with this medicine? -allopurinol -anticoagulants -birth control pills -methotrexate -probenecid This list may not describe all possible interactions. Give your health care provider a list of all the medicines, herbs, non-prescription drugs, or dietary supplements you use. Also tell them if you smoke, drink alcohol, or use illegal drugs. Some items may interact with  your medicine. What should I watch for while using this medicine? Tell your doctor or health care professional if your symptoms do not improve. Do not treat diarrhea with over the counter products. Contact your doctor if you have diarrhea that lasts more than 2 days or if it is severe and watery. If you have diabetes, you may get a false-positive result for sugar in your urine. Check with your doctor or health care professional. Birth control pills may not work properly while you are taking this medicine. Talk to your doctor about using an extra method of birth control. What side effects may I notice from receiving this medicine? Side effects that you should report to your doctor or health care professional as soon as possible: -allergic reactions like skin rash, itching or hives, swelling of the face, lips, or tongue -breathing problems -dark urine -fever or chills, sore throat -redness, blistering, peeling or loosening of the skin,  including inside the mouth -seizures -trouble passing urine or change in the amount of urine -unusual bleeding, bruising -unusually weak or tired -white patches or sores in the mouth or throat Side effects that usually do not require medical attention (report to your doctor or health care professional if they continue or are bothersome): -diarrhea -dizziness -headache -nausea, vomiting -stomach upset -vaginal or anal irritation This list may not describe all possible side effects. Call your doctor for medical advice about side effects. You may report side effects to FDA at 1-800-FDA-1088. Where should I keep my medicine? Keep out of the reach of children. Store at room temperature below 25 degrees C (77 degrees F). Keep container tightly closed. Throw away any unused medicine after the expiration date. NOTE: This sheet is a summary. It may not cover all possible information. If you have questions about this medicine, talk to your doctor, pharmacist, or health care provider.  2019 Elsevier/Gold Standard (2007-04-17 12:04:30) Bartholin's Cyst or Abscess  A Bartholin's cyst is a fluid-filled sac that forms on a Bartholin's gland. Bartholin's glands are small glands in the folds of skin around the opening of the vagina (labia). This type of cyst causes a bulge or lump near the lower opening of the vagina. If you have a cyst that is small and not infected, you may be able to take care of it at home. If your cyst gets infected, it may cause pain and your doctor may need to drain it. An infected Bartholin's cyst is called a Bartholin's abscess. Follow these instructions at home: Medicines  Take over-the-counter and prescription medicines only as told by your doctor.  If you were prescribed an antibiotic medicine, take it as told by your doctor. Do not stop taking the antibiotic even if you start to feel better. Managing pain and swelling  Try sitz baths to help with pain and swelling. A sitz  bath is a warm water bath in which the water only comes up to your hips and should cover your buttocks. You may take sitz baths a few times a day.  Put heat on the affected area as often as needed. Use the heat source that your doctor recommends, such as a moist heat pack or a heating pad. ? Place a towel between your skin and the heat source. ? Leave the heat on for 20-30 minutes. ? Remove the heat if your skin turns bright red. This is especially important if you cannot feel pain, heat, or cold. You may have a greater risk of getting burned. General instructions  If your cyst or abscess was drained: ? Follow instructions from your doctor about how to take care of your wound. ? Use feminine pads to absorb any fluid.  Do not push on or squeeze your cyst.  Do not have sex until the cyst has gone away or your wound from drainage has healed.  Take these steps to help prevent a Bartholin's cyst from returning, and to prevent other Bartholin's cysts from forming: ? Take a bath or shower once a day. Clean your vaginal area with mild soap and water when you bathe. ? Practice safe sex to prevent STIs (sexually transmitted infections). Talk with your doctor about how to prevent STIs and which forms of birth control (contraception) may be best for you.  Keep all follow-up visits as told by your doctor. This is important. Contact a doctor if:  You have a fever.  You get redness, swelling, or pain around your cyst.  You have fluid, blood, pus, or a bad smell coming from your cyst.  You have a cyst that gets larger or comes back. Summary  A Bartholin's cyst is a fluid-filled sac that forms on a Bartholin's gland. These small glands are found in the folds of skin around the opening of the vagina (labia).  This type of cyst causes a bulge or lump near the lower opening of the vagina. An infected Bartholin's cyst is called a Bartholin's abscess.  Try sitz baths a few times a day to help with pain  and swelling.  Do not push on or squeeze your cyst. This information is not intended to replace advice given to you by your health care provider. Make sure you discuss any questions you have with your health care provider. Document Released: 04/20/2008 Document Revised: 10/24/2016 Document Reviewed: 10/24/2016 Elsevier Interactive Patient Education  2019 [employer].

## 2018-03-21 NOTE — Progress Notes (Signed)
[facility]  Hospital Follow Up  Subjective:  Patient ID: Kathleen Herrera, female    DOB: February 07, 1960  Age: 58 y.o. MRN: [id number]  CC:  Chief Complaint  Patient presents with  . Chills  . Generalized Body Aches  . Cough  . Nasal Congestion    HPI Kathleen Herrera is a 58 year old female who presents for Hospital Follow Up today.   Past Medical History:  Diagnosis Date  . Arthritis   . Asthma   . 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 Bipolar disorder (HCC)   . Bronchitis   . Depression   . Fibromyalgia Y-2  . Fibromyalgia unknown  . Hyperlipemia   . Hypertension   . Post traumatic stress disorder   . Sinus congestion    Current Status: Since her last office visit, she has chills, sore throat, cough, and fatigue X 1 week. She has been drinking plenty of water and orange juice. She states that she is feeling better since this. She states that she has had a drug relapse a few months ago. She denies visual changes, chest pain, cough, shortness of breath, heart palpitations, and falls. She has occasional headaches and dizziness with position changes. Denies severe headaches, confusion, seizures, double vision, and blurred vision, nausea and vomiting.  She denies fevers, chills, fatigue, recent infections, weight loss, and night sweats. No reports of GI problems such as diarrhea, and constipation. She has no reports of blood in stools, dysuria and hematuria. No depression or anxiety reported. She denies pain today.   Past Surgical History:  Procedure Laterality Date  . ABDOMINAL HYSTERECTOMY      Family History  Problem Relation Age of Onset  . Dementia Mother   . Prostate cancer Father   . Dementia Father   . Prostate cancer Other   . CAD Other     Social History   Socioeconomic History  . Marital status: Single    Spouse name: Not on file  . Number of children: Not on file  . Years of education: Not on file  . Highest education  level: Not on file  Occupational History  . Not on file  Social Needs  . Financial resource strain: Not on file  . Food insecurity:    Worry: Not on file    Inability: Not on file  . Transportation needs:    Medical: Not on file    Non-medical: Not on file  Tobacco Use  . Smoking status: Current Every Day Smoker    Packs/day: 0.25    Years: 37.00    Pack years: 9.25    Types: Cigarettes  . Smokeless tobacco: Never Used  Substance and Sexual Activity  . Alcohol use: Yes    Comment: occasionally  . Drug use: No    Comment: recovering crack addict  . Sexual activity: Not Currently  Lifestyle  . Physical activity:    Days per week: Not on file    Minutes per session: Not on file  . Stress: Not on file  Relationships  . Social connections:    Talks on phone: Not on file    Gets together: Not on file    Attends religious service: Not on file    Active member of club or organization: Not on file    Attends meetings of clubs or organizations: Not on file    Relationship status: Not on file  . Intimate partner violence:    Fear of  current or ex partner: Not on file    Emotionally abused: Not on file    Physically abused: Not on file    Forced sexual activity: Not on file  Other Topics Concern  . Not on file  Social History Narrative  . Not on file    Outpatient Medications Prior to Visit  Medication Sig Dispense Refill  . amLODipine (NORVASC) 10 MG tablet TAKE 1 TABLET (10 MG TOTAL) BY MOUTH DAILY. 90 tablet 3  . busPIRone (BUSPAR) 15 MG tablet Take 15 mg by mouth 3 (three) times daily.    . clotrimazole-betamethasone (LOTRISONE) cream APPLY 1 APPLICATION TOPICALLY TWO (TWO) TIMES DAILY. (Patient taking differently: Apply 1 application topically 2 (two) times daily as needed (fungal infections). ) 90 g 2  . furosemide (LASIX) 20 MG tablet TAKE 1 TABLET (20 MG TOTAL) BY MOUTH DAILY. 30 tablet 3  . GNP ASPIRIN 325 MG tablet TAKE 1 TABLET (325 MG TOTAL) BY MOUTH DAILY.  (Patient taking differently: Take 325 mg by mouth daily. ) 30 tablet 6  . Guaifenesin 1200 MG TB12 Take 1 tablet (1,200 mg total) by mouth 2 (two) times daily. 20 each 0  . hydrOXYzine (ATARAX/VISTARIL) 25 MG tablet Take 1 tablet (25 mg total) by mouth 3 (three) times daily as needed for itching or anxiety. (Patient taking differently: Take 25 mg by mouth daily. ) 30 tablet 1  . lamoTRIgine (LAMICTAL) 25 MG tablet Take 1 tablet (25 mg total) by mouth 2 (two) times daily. For mood control 60 tablet 0  . lithium carbonate 300 MG capsule Take 1 capsule (300 mg total) by mouth every morning. For mood control (Patient taking differently: Take 300-600 mg by mouth See admin instructions. Take 300 mg by mouth in the morning and 600 mg by mouth at bedtime) 30 capsule 0  . meclizine (ANTIVERT) 25 MG tablet Take 1 tablet (25 mg total) by mouth 3 (three) times daily as needed for dizziness. 30 tablet 0  . metoprolol succinate (TOPROL-XL) 50 MG 24 hr tablet TAKE TWO TABLETS (100 MG TOTAL) BY MOUTH DAILY. TAKE WITH OR IMMEDIATELY FOLLOWING A MEAL. 180 tablet 7  . nystatin (MYCOSTATIN/NYSTOP) powder APPLY TWO SPRINKLES OF POWDER TO THE AFFECTED INNER THIGHS TO PREVENT ITCHING AND FUNGAL RASH (Patient taking differently: Apply 1 g topically daily as needed (itching). APPLY TWO SPRINKLES OF POWDER TO THE AFFECTED INNER THIGHS TO PREVENT ITCHING AND FUNGAL RASH) 30 g 2  . pantoprazole (PROTONIX) 40 MG tablet TAKE 1 TABLET (40 MG TOTAL) BY MOUTH TWO TIMES DAILY BEFORE A MEAL. (Patient taking differently: Take 40 mg by mouth 2 (two) times daily before a meal. ) 60 tablet 6  . PARoxetine (PAXIL) 30 MG tablet Take 30 mg by mouth every morning.    . potassium chloride SA (K-DUR,KLOR-CON) 20 MEQ tablet Take 1 tablet (20 mEq total) by mouth daily. 3 tablet 0  . PROAIR HFA 108 (90 Base) MCG/ACT inhaler INHALE TWO PUFFS INTO THE LUNGS EVERY 6 HOURS AS NEEDED FOR WHEEZING OR SHORTNESS OF BREATH. (Patient taking differently: Inhale 2  puffs into the lungs every 6 (six) hours as needed for wheezing or shortness of breath. ) 8.5 Inhaler 3  . simvastatin (ZOCOR) 20 MG tablet TAKE ONE TABLET   (20 MG TOTAL ) BY MOUTH   AT BEDTIME (Patient taking differently: Take 20 mg by mouth at bedtime. ) 90 tablet 3  . SYMBICORT 80-4.5 MCG/ACT inhaler INHALE TWO PUFFS INTO THE LUNGS TWO TIMES DAILY. (  Patient taking differently: Inhale 2 puffs into the lungs 2 (two) times daily. ) 1 Inhaler 1  . VESICARE 5 MG tablet TAKE 1 TABLET (5 MG TOTAL) BY MOUTH DAILY. (Patient taking differently: Take 5 mg by mouth daily. ) 90 tablet 11  . gabapentin (NEURONTIN) 300 MG capsule TAKE 1 CAPSULE (300 MG TOTAL) BY MOUTH THREE TIMES DAILY. (Patient taking differently: Take 300 mg by mouth 3 (three) times daily. ) 90 capsule 3  . promethazine-dextromethorphan (PROMETHAZINE-DM) 6.25-15 MG/5ML syrup Take 5-10 mLs by mouth 4 (four) times daily as needed for cough. (Patient not taking: Reported on 03/21/2018) 240 mL 0  . benzonatate (TESSALON) 100 MG capsule Take 1 capsule (100 mg total) by mouth every 8 (eight) hours. (Patient not taking: Reported on 03/21/2018) 21 capsule 0  . doxycycline (VIBRAMYCIN) 100 MG capsule Take 1 capsule (100 mg total) by mouth 2 (two) times daily. (Patient not taking: Reported on 03/21/2018) 20 capsule 0   No facility-administered medications prior to visit.     No Known Allergies  ROS Review of Systems  Constitutional: Negative.   HENT: Negative.   Eyes: Negative.   Respiratory: Negative.   Cardiovascular: Negative.   Gastrointestinal: Negative.   Endocrine: Negative.   Genitourinary: Negative.   Musculoskeletal: Negative.   Skin: Negative.   Allergic/Immunologic: Negative.   Neurological: Positive for dizziness and headaches.  Hematological: Negative.   Psychiatric/Behavioral: Negative.    Objective:    Physical Exam  BP 124/78 (BP Location: Left Arm, Patient Position: Sitting, Cuff Size: Large)   Pulse 60   Temp (!)  100.5 F (38.1 C) (Oral)   Ht 5\' 3"  (1.6 m)   Wt 202 lb 9.6 oz (91.9 kg)   SpO2 99%   BMI 35.89 kg/m  Wt Readings from Last 3 Encounters:  03/21/18 202 lb 9.6 oz (91.9 kg)  01/20/18 218 lb (98.9 kg)  12/26/17 220 lb (99.8 kg)     Health Maintenance Due  Topic Date Due  . PAP SMEAR-Modifier  04/21/1980  . COLONOSCOPY  04/21/2009  . MAMMOGRAM  01/23/2018    There are no preventive care reminders to display for this patient.  Lab Results  Component Value Date   TSH 1.501 02/27/2017   Lab Results  Component Value Date   WBC 7.7 01/20/2018   HGB 13.2 01/20/2018   HCT 44.1 01/20/2018   MCV 86.6 01/20/2018   PLT 280 01/20/2018   Lab Results  Component Value Date   NA 141 01/20/2018   K 3.4 (L) 01/20/2018   CO2 25 01/20/2018   GLUCOSE 189 (H) 01/20/2018   BUN <5 (L) 01/20/2018   CREATININE 0.75 01/20/2018   BILITOT 0.2 (L) 03/18/2017   ALKPHOS 72 03/18/2017   AST 26 03/18/2017   ALT 18 03/18/2017   PROT 7.4 03/18/2017   ALBUMIN 3.5 03/18/2017   CALCIUM 8.9 01/20/2018   ANIONGAP 11 01/20/2018   Lab Results  Component Value Date   CHOL 204 (H) 11/01/2016   Lab Results  Component Value Date   HDL 64 11/01/2016   Lab Results  Component Value Date   LDLCALC 87 11/01/2016   Lab Results  Component Value Date   TRIG 266 (H) 11/01/2016   Lab Results  Component Value Date   CHOLHDL 3.2 11/01/2016   Lab Results  Component Value Date   HGBA1C 5.4 03/20/2017     Assessment & Plan:   1. Hospital discharge follow-up  2. Flu-like symptoms Strep and Influenza tests are  negative.  - POCT Influenza A/B - POCT rapid strep A - Culture, Group A Strep  3. Bartholin cyst We will initiate Augmentin today.  - amoxicillin-clavulanate (AUGMENTIN) 875-125 MG tablet; Take 1 tablet by mouth 2 (two) times daily for 7 days.  Dispense: 14 tablet; Refill: 0  4. Essential hypertension Blood pressure is stable today at 124/78. Continue meds as prescribed. She will  continue to decrease high sodium intake, excessive alcohol intake, increase potassium intake, smoking cessation, and increase physical activity of at least 30 minutes of cardio activity daily. She will continue to follow Heart Healthy or DASH diet.  5. Generalized anxiety disorder Continue Buspar as prescribed.   6. Neuropathic pain We will increase Gabapentin increased to 600 mg BID today.  - gabapentin (NEURONTIN) 300 MG capsule; Take 2 capsules 2 times daily.  Dispense: 120 capsule; Refill: 3  7. Follow up She will follow up in 2 months.   Meds ordered this encounter  Medications  . gabapentin (NEURONTIN) 300 MG capsule    Sig: Take 2 capsules 2 times daily.    Dispense:  120 capsule    Refill:  3  . amoxicillin-clavulanate (AUGMENTIN) 875-125 MG tablet    Sig: Take 1 tablet by mouth 2 (two) times daily for 7 days.    Dispense:  14 tablet    Refill:  0   Orders Placed This Encounter  Procedures  . Culture, Group A Strep  . POCT Influenza A/B  . POCT rapid strep A    Referral Orders  No referral(s) requested today  Raliegh Ip,  MSN, FNP-C [facility] [address] [place], [place] [address] [phone]      Problem List Items Addressed This Visit      Cardiovascular and Mediastinum   Essential hypertension     Other   Generalized anxiety disorder   Neuropathic pain   Relevant Medications   gabapentin (NEURONTIN) 300 MG capsule    Other Visit Diagnoses    Hospital discharge follow-up    -  Primary   Flu-like symptoms       Relevant Orders   POCT Influenza A/B (Completed)   POCT rapid strep A (Completed)   Culture, Group A Strep (Completed)   Bartholin cyst       Relevant Medications   amoxicillin-clavulanate (AUGMENTIN) 875-125 MG tablet   Follow up          Meds ordered this encounter  Medications  . gabapentin (NEURONTIN) 300 MG capsule    Sig: Take 2 capsules 2 times daily.    Dispense:  120 capsule      Refill:  3  . amoxicillin-clavulanate (AUGMENTIN) 875-125 MG tablet    Sig: Take 1 tablet by mouth 2 (two) times daily for 7 days.    Dispense:  14 tablet    Refill:  0    Follow-up: Return in about 2 months (around 05/20/2018).    Kallie Locks, FNP

## 2018-03-24 LAB — CULTURE, GROUP A STREP: Strep A Culture: NEGATIVE

## 2018-03-25 ENCOUNTER — Encounter: Payer: Self-pay | Admitting: Family Medicine

## 2018-03-28 ENCOUNTER — Ambulatory Visit: Payer: Self-pay | Admitting: Family Medicine

## 2018-03-31 ENCOUNTER — Telehealth: Payer: Self-pay

## 2018-03-31 MED ORDER — CLOTRIMAZOLE-BETAMETHASONE 1-0.05 % EX CREA: TOPICAL_CREAM | CUTANEOUS | 2 refills | Status: DC

## 2018-03-31 MED ORDER — SOLIFENACIN SUCCINATE 5 MG PO TABS: ORAL_TABLET | ORAL | 11 refills | Status: DC

## 2018-03-31 NOTE — Telephone Encounter (Signed)
Left a vm for patient to callback regarding Gabapentin

## 2018-04-09 ENCOUNTER — Telehealth: Payer: Self-pay

## 2018-04-09 NOTE — Telephone Encounter (Signed)
Left a vm for patient to callback 

## 2018-04-15 NOTE — Telephone Encounter (Signed)
Have been unable to reach patient by phone.

## 2018-04-18 ENCOUNTER — Other Ambulatory Visit: Payer: Self-pay

## 2018-04-18 ENCOUNTER — Ambulatory Visit (INDEPENDENT_AMBULATORY_CARE_PROVIDER_SITE_OTHER): Payer: Medicaid Other | Admitting: Family Medicine

## 2018-04-18 ENCOUNTER — Ambulatory Visit: Payer: Self-pay | Admitting: Family Medicine

## 2018-04-18 VITALS — BP 130/74 | HR 96 | Temp 98.0°F | Ht 63.0 in | Wt 210.0 lb

## 2018-04-18 DIAGNOSIS — M792 Neuralgia and neuritis, unspecified: Secondary | ICD-10-CM | POA: Diagnosis not present

## 2018-04-18 DIAGNOSIS — I10 Essential (primary) hypertension: Secondary | ICD-10-CM | POA: Diagnosis not present

## 2018-04-18 DIAGNOSIS — Z09 Encounter for follow-up examination after completed treatment for conditions other than malignant neoplasm: Secondary | ICD-10-CM

## 2018-04-18 DIAGNOSIS — R319 Hematuria, unspecified: Secondary | ICD-10-CM

## 2018-04-18 DIAGNOSIS — N390 Urinary tract infection, site not specified: Secondary | ICD-10-CM

## 2018-04-18 DIAGNOSIS — F411 Generalized anxiety disorder: Secondary | ICD-10-CM | POA: Diagnosis not present

## 2018-04-18 DIAGNOSIS — R21 Rash and other nonspecific skin eruption: Secondary | ICD-10-CM | POA: Diagnosis not present

## 2018-04-18 DIAGNOSIS — L299 Pruritus, unspecified: Secondary | ICD-10-CM

## 2018-04-18 LAB — POCT URINALYSIS DIP (MANUAL ENTRY)
Bilirubin, UA: NEGATIVE
Glucose, UA: NEGATIVE mg/dL
Ketones, POC UA: NEGATIVE mg/dL
Nitrite, UA: NEGATIVE
Protein Ur, POC: NEGATIVE mg/dL
Spec Grav, UA: 1.01 (ref 1.010–1.025)
Urobilinogen, UA: 1 E.U./dL
pH, UA: 6.5 (ref 5.0–8.0)

## 2018-04-18 MED ORDER — HYDROCORTISONE 0.5 % EX CREA: 1.0000 "application " | TOPICAL_CREAM | Freq: Two times a day (BID) | CUTANEOUS | 2 refills | Status: DC

## 2018-04-18 MED ORDER — SULFAMETHOXAZOLE-TRIMETHOPRIM 800-160 MG PO TABS: 1.0000 | ORAL_TABLET | Freq: Two times a day (BID) | ORAL | 0 refills | Status: DC

## 2018-04-18 MED ORDER — NITROFURANTOIN MONOHYD MACRO 100 MG PO CAPS: 100.0000 mg | ORAL_CAPSULE | Freq: Two times a day (BID) | ORAL | 0 refills | Status: AC

## 2018-04-18 MED ORDER — GABAPENTIN 300 MG PO CAPS: ORAL_CAPSULE | ORAL | 1 refills | Status: DC

## 2018-04-18 NOTE — Progress Notes (Signed)
[facility]  Sick Visit  Subjective:  Patient ID: Kathleen Herrera, female    DOB: 02-04-1961  Age: 58 y.o. MRN: [id number]  CC:  Chief Complaint  Patient presents with  . Leg Pain    left    HPI Kathleen Herrera is a 58 year old who presents for Sick Visit today.   Past Medical History:  Diagnosis Date  . Arthritis   . Asthma   . 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 Bipolar disorder (HCC)   . Bronchitis   . Depression   . Fibromyalgia Y-2  . Fibromyalgia unknown  . Hyperlipemia   . Hypertension   . Post traumatic stress disorder   . Sinus congestion    Current Status: Since her last office visit, she is doing well with no complaints. She states that she has been having back pain, which radiates to her hip and down her leg. She currently sleeping on a couch. She denies visual changes, chest pain, cough, shortness of breath, heart palpitations, and falls. She has occasional headaches and dizziness with position changes. Denies severe headaches, confusion, seizures, double vision, and blurred vision, nausea and vomiting.She arrives today with the assistance of a cane of her left lower extremity pain and weakness. Her anxiety is mild today, as she is now stable from recent relapse. She recently relocated to [place], [place]. She denies suicidal ideations, homicidal ideations, or auditory hallucinations.  She denies fevers, chills, fatigue, recent infections, weight loss, and night sweats. She has not had any visual changes, and falls. No chest pain, heart palpitations, cough and shortness of breath reported. No reports of GI problems such as diarrhea, and constipation. She has no reports of blood in stools, dysuria and hematuria.   Past Surgical History:  Procedure Laterality Date  . ABDOMINAL HYSTERECTOMY      Family History  Problem Relation Age of Onset  . Dementia Mother   . Prostate cancer Father   . Dementia Father   . Prostate cancer  Other   . CAD Other     Social History   Socioeconomic History  . Marital status: Single    Spouse name: Not on file  . Number of children: Not on file  . Years of education: Not on file  . Highest education level: Not on file  Occupational History  . Not on file  Social Needs  . Financial resource strain: Not on file  . Food insecurity:    Worry: Not on file    Inability: Not on file  . Transportation needs:    Medical: Not on file    Non-medical: Not on file  Tobacco Use  . Smoking status: Current Every Day Smoker    Packs/day: 0.25    Years: 37.00    Pack years: 9.25    Types: Cigarettes  . Smokeless tobacco: Never Used  Substance and Sexual Activity  . Alcohol use: Yes    Comment: occasionally  . Drug use: No    Comment: recovering crack addict  . Sexual activity: Not Currently  Lifestyle  . Physical activity:    Days per week: Not on file    Minutes per session: Not on file  . Stress: Not on file  Relationships  . Social connections:    Talks on phone: Not on file    Gets together: Not on file    Attends religious service: Not on file    Active member of club or  organization: Not on file    Attends meetings of clubs or organizations: Not on file    Relationship status: Not on file  . Intimate partner violence:    Fear of current or ex partner: Not on file    Emotionally abused: Not on file    Physically abused: Not on file    Forced sexual activity: Not on file  Other Topics Concern  . Not on file  Social History Narrative  . Not on file    Outpatient Medications Prior to Visit  Medication Sig Dispense Refill  . amLODipine (NORVASC) 10 MG tablet TAKE 1 TABLET (10 MG TOTAL) BY MOUTH DAILY. 90 tablet 3  . busPIRone (BUSPAR) 15 MG tablet Take 15 mg by mouth 3 (three) times daily.    . clotrimazole-betamethasone (LOTRISONE) cream APPLY 1 APPLICATION TOPICALLY TWO (TWO) TIMES DAILY. 90 g 2  . furosemide (LASIX) 20 MG tablet TAKE 1 TABLET (20 MG TOTAL)  BY MOUTH DAILY. 30 tablet 3  . gabapentin (NEURONTIN) 300 MG capsule Take 2 capsules 2 times daily. 120 capsule 3  . GNP ASPIRIN 325 MG tablet TAKE 1 TABLET (325 MG TOTAL) BY MOUTH DAILY. (Patient taking differently: Take 325 mg by mouth daily. ) 30 tablet 6  . Guaifenesin 1200 MG TB12 Take 1 tablet (1,200 mg total) by mouth 2 (two) times daily. 20 each 0  . hydrOXYzine (ATARAX/VISTARIL) 25 MG tablet Take 1 tablet (25 mg total) by mouth 3 (three) times daily as needed for itching or anxiety. (Patient taking differently: Take 25 mg by mouth daily. ) 30 tablet 1  . lamoTRIgine (LAMICTAL) 25 MG tablet Take 1 tablet (25 mg total) by mouth 2 (two) times daily. For mood control 60 tablet 0  . lithium carbonate 300 MG capsule Take 1 capsule (300 mg total) by mouth every morning. For mood control (Patient taking differently: Take 300-600 mg by mouth See admin instructions. Take 300 mg by mouth in the morning and 600 mg by mouth at bedtime) 30 capsule 0  . meclizine (ANTIVERT) 25 MG tablet Take 1 tablet (25 mg total) by mouth 3 (three) times daily as needed for dizziness. 30 tablet 0  . metoprolol succinate (TOPROL-XL) 50 MG 24 hr tablet TAKE TWO TABLETS (100 MG TOTAL) BY MOUTH DAILY. TAKE WITH OR IMMEDIATELY FOLLOWING A MEAL. 180 tablet 7  . nystatin (MYCOSTATIN/NYSTOP) powder APPLY TWO SPRINKLES OF POWDER TO THE AFFECTED INNER THIGHS TO PREVENT ITCHING AND FUNGAL RASH (Patient taking differently: Apply 1 g topically daily as needed (itching). APPLY TWO SPRINKLES OF POWDER TO THE AFFECTED INNER THIGHS TO PREVENT ITCHING AND FUNGAL RASH) 30 g 2  . pantoprazole (PROTONIX) 40 MG tablet TAKE 1 TABLET (40 MG TOTAL) BY MOUTH TWO TIMES DAILY BEFORE A MEAL. (Patient taking differently: Take 40 mg by mouth 2 (two) times daily before a meal. ) 60 tablet 6  . PARoxetine (PAXIL) 30 MG tablet Take 30 mg by mouth every morning.    . potassium chloride SA (K-DUR,KLOR-CON) 20 MEQ tablet Take 1 tablet (20 mEq total) by mouth  daily. 3 tablet 0  . PROAIR HFA 108 (90 Base) MCG/ACT inhaler INHALE TWO PUFFS INTO THE LUNGS EVERY 6 HOURS AS NEEDED FOR WHEEZING OR SHORTNESS OF BREATH. (Patient taking differently: Inhale 2 puffs into the lungs every 6 (six) hours as needed for wheezing or shortness of breath. ) 8.5 Inhaler 3  . promethazine-dextromethorphan (PROMETHAZINE-DM) 6.25-15 MG/5ML syrup Take 5-10 mLs by mouth 4 (four) times daily as  needed for cough. 240 mL 0  . simvastatin (ZOCOR) 20 MG tablet TAKE ONE TABLET   (20 MG TOTAL ) BY MOUTH   AT BEDTIME (Patient taking differently: Take 20 mg by mouth at bedtime. ) 90 tablet 3  . solifenacin (VESICARE) 5 MG tablet TAKE 1 TABLET (5 MG TOTAL) BY MOUTH DAILY. 90 tablet 11  . SYMBICORT 80-4.5 MCG/ACT inhaler INHALE TWO PUFFS INTO THE LUNGS TWO TIMES DAILY. (Patient taking differently: Inhale 2 puffs into the lungs 2 (two) times daily. ) 1 Inhaler 1   No facility-administered medications prior to visit.     No Known Allergies  ROS Review of Systems  Constitutional: Negative.   HENT: Negative.   Eyes: Negative.   Respiratory: Negative.   Cardiovascular: Negative.   Gastrointestinal: Positive for abdominal distention (Obese).  Endocrine: Negative.   Genitourinary: Negative.   Musculoskeletal: Negative.   Skin: Positive for rash.  Allergic/Immunologic: Negative.   Neurological: Positive for numbness (left lower extremity weakness).  Hematological: Negative.   Psychiatric/Behavioral: Negative.    Objective:    Physical Exam  Constitutional: She is oriented to person, place, and time. She appears well-developed and well-nourished.  HENT:  Head: Normocephalic and atraumatic.  Eyes: Conjunctivae are normal.  Neck: Normal range of motion. Neck supple.  Cardiovascular: Normal rate, regular rhythm, normal heart sounds and intact distal pulses.  Pulmonary/Chest: Effort normal and breath sounds normal.  Abdominal: Soft. Bowel sounds are normal. She exhibits distension  (obese).  Musculoskeletal: Normal range of motion.  Neurological: She is alert and oriented to person, place, and time. She has normal reflexes.  Skin: Skin is warm and dry. Rash noted.  Psychiatric: She has a normal mood and affect. Her behavior is normal. Judgment and thought content normal.  Nursing note and vitals reviewed.   BP 130/74 (BP Location: Left Arm, Patient Position: Sitting, Cuff Size: Large)   Pulse 96   Temp 98 F (36.7 C) (Oral)   Ht  (1.6 m)   Wt 210 lb (95.3 kg)   SpO2 100%   BMI 37.20 kg/m  Wt Readings from Last 3 Encounters:  04/18/18 210 lb (95.3 kg)  03/21/18 202 lb 9.6 oz (91.9 kg)  01/20/18 218 lb (98.9 kg)     Health Maintenance Due  Topic Date Due  . PAP SMEAR-Modifier  04/21/1980  . COLONOSCOPY  04/21/2009  . MAMMOGRAM  01/23/2018    There are no preventive care reminders to display for this patient.  Lab Results  Component Value Date   TSH 1.501 02/27/2017   Lab Results  Component Value Date   WBC 7.7 01/20/2018   HGB 13.2 01/20/2018   HCT 44.1 01/20/2018   MCV 86.6 01/20/2018   PLT 280 01/20/2018   Lab Results  Component Value Date   NA 141 01/20/2018   K 3.4 (L) 01/20/2018   CO2 25 01/20/2018   GLUCOSE 189 (H) 01/20/2018   BUN <5 (L) 01/20/2018   CREATININE 0.75 01/20/2018   BILITOT 0.2 (L) 03/18/2017   ALKPHOS 72 03/18/2017   AST 26 03/18/2017   ALT 18 03/18/2017   PROT 7.4 03/18/2017   ALBUMIN 3.5 03/18/2017   CALCIUM 8.9 01/20/2018   ANIONGAP 11 01/20/2018   Lab Results  Component Value Date   CHOL 204 (H) 11/01/2016   Lab Results  Component Value Date   HDL 64 11/01/2016   Lab Results  Component Value Date   LDLCALC 87 11/01/2016   Lab Results  Component Value  Date   TRIG 266 (H) 11/01/2016   Lab Results  Component Value Date   CHOLHDL 3.2 11/01/2016   Lab Results  Component Value Date   HGBA1C 5.4 03/20/2017   Assessment & Plan:   1. Essential hypertension Antihypertensive medication is  effective. Blood pressure is stable at 130/74 today. Continue Amlodipine and Metoprolol as prescribed. She will continue to decrease high sodium intake, excessive alcohol intake, increase potassium intake, smoking cessation, and increase physical activity of at least 30 minutes of cardio activity daily. She will continue to follow Heart Healthy or DASH diet.  2. Neuropathic pain Continue Gabapentin as prescribed.   3. Generalized anxiety disorder Stable today. Continue Buspar and Hydroxyzine as prescribed.   4. Skin rash Continue Hydroxyzine as prescribed.  - hydrocortisone cream 0.5 %; Apply 1 application topically 2 (two) times daily.  Dispense: 30 g; Refill: 2  5. Itching We will initiate Hydrocortisone cream today.  - hydrocortisone cream 0.5 %; Apply 1 application topically 2 (two) times daily.  Dispense: 30 g; Refill: 2  6. Pruritus of skin Stable.   7. Urinary tract infection with hematuria, site unspecified We will initiate Macrobid today.  - nitrofurantoin, macrocrystal-monohydrate, (MACROBID) 100 MG capsule; Take 1 capsule (100 mg total) by mouth 2 (two) times daily for 7 days.  Dispense: 14 capsule; Refill: 0  8. Follow up He will follow up in 3 months.  - POCT urinalysis dipstick  Meds ordered this encounter  Medications  . gabapentin (NEURONTIN) 300 MG capsule    Sig: Take 2 capsules 3 times as day.    Dispense:  180 capsule    Refill:  1  . DISCONTD: sulfamethoxazole-trimethoprim (BACTRIM DS,SEPTRA DS) 800-160 MG tablet    Sig: Take 1 tablet by mouth 2 (two) times daily.    Dispense:  14 tablet    Refill:  0  . nitrofurantoin, macrocrystal-monohydrate, (MACROBID) 100 MG capsule    Sig: Take 1 capsule (100 mg total) by mouth 2 (two) times daily for 7 days.    Dispense:  14 capsule    Refill:  0  . hydrocortisone cream 0.5 %    Sig: Apply 1 application topically 2 (two) times daily.    Dispense:  30 g    Refill:  2    Orders Placed This Encounter   Procedures  . POCT urinalysis dipstick    Referral Orders  No referral(s) requested today    Raliegh Ip,  MSN, FNP-C [facility] [address] [place], [place] [address] [phone]   Problem List Items Addressed This Visit      Cardiovascular and Mediastinum   Essential hypertension - Primary     Other   Generalized anxiety disorder   Neuropathic pain    Other Visit Diagnoses    Skin rash       Relevant Medications   hydrocortisone cream 0.5 %   Itching       Relevant Medications   hydrocortisone cream 0.5 %   Pruritus of skin       Urinary tract infection with hematuria, site unspecified       Relevant Medications   nitrofurantoin, macrocrystal-monohydrate, (MACROBID) 100 MG capsule   Follow up       Relevant Orders   POCT urinalysis dipstick (Completed)      Meds ordered this encounter  Medications  . gabapentin (NEURONTIN) 300 MG capsule    Sig: Take 2 capsules 3 times as day.  Dispense:  180 capsule    Refill:  1  . DISCONTD: sulfamethoxazole-trimethoprim (BACTRIM DS,SEPTRA DS) 800-160 MG tablet    Sig: Take 1 tablet by mouth 2 (two) times daily.    Dispense:  14 tablet    Refill:  0  . nitrofurantoin, macrocrystal-monohydrate, (MACROBID) 100 MG capsule    Sig: Take 1 capsule (100 mg total) by mouth 2 (two) times daily for 7 days.    Dispense:  14 capsule    Refill:  0  . hydrocortisone cream 0.5 %    Sig: Apply 1 application topically 2 (two) times daily.    Dispense:  30 g    Refill:  2    Follow-up: Return in about 3 months (around 07/19/2018).    Kallie Locks, FNP

## 2018-04-18 NOTE — Patient Instructions (Addendum)
Nitrofurantoin tablets or capsules What is this medicine? NITROFURANTOIN (nye troe fyoor AN toyn) is an antibiotic. It is used to treat urinary tract infections. This medicine may be used for other purposes; ask your health care provider or pharmacist if you have questions. COMMON BRAND NAME(S): Macrobid, Macrodantin, Urotoin What should I tell my health care provider before I take this medicine? They need to know if you have any of these conditions: -anemia -diabetes -glucose-6-phosphate dehydrogenase deficiency -kidney disease -liver disease -lung disease -other chronic illness -an unusual or allergic reaction to nitrofurantoin, other antibiotics, other medicines, foods, dyes or preservatives -pregnant or trying to get pregnant -breast-feeding How should I use this medicine? Take this medicine by mouth with a glass of water. Follow the directions on the prescription label. Take this medicine with food or milk. Take your doses at regular intervals. Do not take your medicine more often than directed. Do not stop taking except on your doctor's advice. Talk to your pediatrician regarding the use of this medicine in children. While this drug may be prescribed for selected conditions, precautions do apply. Overdosage: If you think you have taken too much of this medicine contact a poison control center or emergency room at once. NOTE: This medicine is only for you. Do not share this medicine with others. What if I miss a dose? If you miss a dose, take it as soon as you can. If it is almost time for your next dose, take only that dose. Do not take double or extra doses. What may interact with this medicine? -antacids containing magnesium trisilicate -probenecid -quinolone antibiotics like ciprofloxacin, lomefloxacin, norfloxacin and ofloxacin -sulfinpyrazone This list may not describe all possible interactions. Give your health care provider a list of all the medicines, herbs,  non-prescription drugs, or dietary supplements you use. Also tell them if you smoke, drink alcohol, or use illegal drugs. Some items may interact with your medicine. What should I watch for while using this medicine? Tell your doctor or health care professional if your symptoms do not improve or if you get new symptoms. Drink several glasses of water a day. If you are taking this medicine for a long time, visit your doctor for regular checks on your progress. If you are diabetic, you may get a false positive result for sugar in your urine with certain brands of urine tests. Check with your doctor. What side effects may I notice from receiving this medicine? Side effects that you should report to your doctor or health care professional as soon as possible: -allergic reactions like skin rash or hives, swelling of the face, lips, or tongue -chest pain -cough -difficulty breathing -dizziness, drowsiness -fever or infection -joint aches or pains -pale or blue-tinted skin -redness, blistering, peeling or loosening of the skin, including inside the mouth -tingling, burning, pain, or numbness in hands or feet -unusual bleeding or bruising -unusually weak or tired -yellowing of eyes or skin Side effects that usually do not require medical attention (report to your doctor or health care professional if they continue or are bothersome): -dark urine -diarrhea -headache -loss of appetite -nausea or vomiting -temporary hair loss This list may not describe all possible side effects. Call your doctor for medical advice about side effects. You may report side effects to FDA at 1-800-FDA-1088. Where should I keep my medicine? Keep out of the reach of children. Store at room temperature between 15 and 30 degrees C (59 and 86 degrees F). Protect from light. Throw away any unused  medicine after the expiration date. NOTE: This sheet is a summary. It may not cover all possible information. If you have  questions about this medicine, talk to your doctor, pharmacist, or health care provider.  2019 Elsevier/Gold Standard (2007-08-13 15:56:47) Urinary Tract Infection, Adult A urinary tract infection (UTI) is an infection of any part of the urinary tract. The urinary tract includes:  The kidneys.  The ureters.  The bladder.  The urethra. These organs make, store, and get rid of pee (urine) in the body. What are the causes? This is caused by germs (bacteria) in your genital area. These germs grow and cause swelling (inflammation) of your urinary tract. What increases the risk? You are more likely to develop this condition if:  You have a small, thin tube (catheter) to drain pee.  You cannot control when you pee or poop (incontinence).  You are female, and: ? You use these methods to prevent pregnancy: ? A medicine that kills sperm (spermicide). ? A device that blocks sperm (diaphragm). ? You have low levels of a female hormone (estrogen). ? You are pregnant.  You have genes that add to your risk.  You are sexually active.  You take antibiotic medicines.  You have trouble peeing because of: ? A prostate that is bigger than normal, if you are female. ? A blockage in the part of your body that drains pee from the bladder (urethra). ? A kidney stone. ? A nerve condition that affects your bladder (neurogenic bladder). ? Not getting enough to drink. ? Not peeing often enough.  You have other conditions, such as: ? Diabetes. ? A weak disease-fighting system (immune system). ? Sickle cell disease. ? Gout. ? Injury of the spine. What are the signs or symptoms? Symptoms of this condition include:  Needing to pee right away (urgently).  Peeing often.  Peeing small amounts often.  Pain or burning when peeing.  Blood in the pee.  Pee that smells bad or not like normal.  Trouble peeing.  Pee that is cloudy.  Fluid coming from the vagina, if you are female.  Pain in  the belly or lower back. Other symptoms include:  Throwing up (vomiting).  No urge to eat.  Feeling mixed up (confused).  Being tired and grouchy (irritable).  A fever.  Watery poop (diarrhea). How is this treated? This condition may be treated with:  Antibiotic medicine.  Other medicines.  Drinking enough water. Follow these instructions at home:  Medicines  Take over-the-counter and prescription medicines only as told by your doctor.  If you were prescribed an antibiotic medicine, take it as told by your doctor. Do not stop taking it even if you start to feel better. General instructions  Make sure you: ? Pee until your bladder is empty. ? Do not hold pee for a long time. ? Empty your bladder after sex. ? Wipe from front to back after pooping if you are a female. Use each tissue one time when you wipe.  Drink enough fluid to keep your pee pale yellow.  Keep all follow-up visits as told by your doctor. This is important. Contact a doctor if:  You do not get better after 1-2 days.  Your symptoms go away and then come back. Get help right away if:  You have very bad back pain.  You have very bad pain in your lower belly.  You have a fever.  You are sick to your stomach (nauseous).  You are throwing up. Summary  A urinary tract infection (UTI) is an infection of any part of the urinary tract.  This condition is caused by germs in your genital area.  There are many risk factors for a UTI. These include having a small, thin tube to drain pee and not being able to control when you pee or poop.  Treatment includes antibiotic medicines for germs.  Drink enough fluid to keep your pee pale yellow. This information is not intended to replace advice given to you by your health care provider. Make sure you discuss any questions you have with your health care provider. Document Released: 07/11/2007 Document Revised: 08/01/2017 Document Reviewed: 08/01/2017  Elsevier Interactive Patient Education  2019 [employer]    Pruritus Pruritus is an itchy feeling on the skin. One of the most common causes is dry skin, but many different things can cause itching. Most cases of itching do not require medical attention. Sometimes itchy skin can turn into a rash. Follow these instructions at home: Skin care   Apply moisturizing lotion to your skin as needed. Lotion that contains petroleum jelly is best.  Take medicines or apply medicated creams only as told by your health care provider. This may include: ? Corticosteroid cream. ? Anti-itch lotions. ? Oral antihistamines.  Apply a cool, wet cloth (cool compress) to the affected areas.  Take baths with one of the following: ? Epsom salts. You can get these at your local pharmacy or grocery store. Follow the instructions on the packaging. ? Baking soda. Pour a small amount into the bath as told by your health care provider. ? Colloidal oatmeal. You can get this at your local pharmacy or grocery store. Follow the instructions on the packaging.  Apply baking soda paste to your skin. To make the paste, stir water into a small amount of baking soda until it reaches a paste-like consistency.  Do not scratch your skin.  Do not take hot showers or baths, which can make itching worse. A cool shower may help with itching as long as you apply moisturizing lotion after the shower.  Do not use scented soaps, detergents, perfumes, and cosmetic products. Instead, use gentle, unscented versions of these items. General instructions  Avoid wearing tight clothes.  Keep a journal to help find out what is causing your itching. Write down: ? What you eat and drink. ? What cosmetic products you use. ? What soaps or detergents you use. ? What you wear, including jewelry.  Use a humidifier. This keeps the air moist, which helps to prevent dry skin.  Be aware of any changes in your itchiness. Contact a health  care provider if:  The itching does not go away after several days.  You are unusually thirsty or urinating more than normal.  Your skin tingles or feels numb.  Your skin or the white parts of your eyes turn yellow (jaundice).  You feel weak.  You have any of the following: ? Night sweats. ? Tiredness (fatigue). ? Weight loss. ? Abdominal pain. Summary  Pruritus is an itchy feeling on the skin. One of the most common causes is dry skin, but many different conditions and factors can cause itching.  Apply moisturizing lotion to your skin as needed. Lotion that contains petroleum jelly is best.  Take medicines or apply medicated creams only as told by your health care provider.  Do not take hot showers or baths. Do not use scented soaps, detergents, perfumes, or cosmetic products. This information is not intended to replace  advice given to you by your health care provider. Make sure you discuss any questions you have with your health care provider. Document Released: 10/04/2010 Document Revised: 02/05/2017 Document Reviewed: 02/05/2017 Elsevier Interactive Patient Education  2019 [place]. Hydrocortisone skin cream, ointment, lotion, or solution What is this medicine? HYDROCORTISONE (hye droe KOR ti sone) is a corticosteroid. It is used on the skin to reduce swelling, redness, itching, and allergic reactions. This medicine may be used for other purposes; ask your health care provider or pharmacist if you have questions. COMMON BRAND NAME(S): Ala-Cort, Ala-Scalp, Anusol HC, Aqua Glycolic HC, Balneol for Her, Caldecort, Cetacort, Cortaid, Cortaid Advanced, Cortaid Intensive Therapy, Cortaid Sensitive Skin, CortAlo, Corticaine, Corticool, Cortizone, Cortizone-10, Cortizone-10 Cooling Relief, Cortizone-10 Intensive Healing, Cortizone-10 Plus, Dermarest Dricort, Dermarest Eczema, DERMASORB HC Complete, Gly-Cort, Hycort, Hydro Skin, Hydrocortisone in Absorbase, Hydroskin, Hytone,  Instacort, Lacticare HC, Locoid, Locoid Lipocream, MiCort-HC, Monistat Complete Care Instant Itch Relief Cream, Neosporin Eczema, NuCort, Nutracort, NuZon, Pandel, Pediaderm HC, Penecort, Preparation H Hydrocortisone, Procto-Kit, Procto-Med HC, Procto-Pak, Proctocort, Proctocream-HC, Proctosol-HC, Proctozone-HC, Rederm, Sarnol-HC, Nurse, adult, Engineer, site, [place], Tucks HC, Vagisil Anti-Itch, [facility] Intensive Healing, Human resources officer What should I tell my health care provider before I take this medicine? They need to know if you have any of these conditions: -any active infection -diabetes -large areas of burned or damaged skin -skin wasting or thinning -an unusual or allergic reaction to hydrocortisone, corticosteroids, sulfites, other medicines, foods, dyes, or preservatives -pregnant or trying to get pregnant -breast-feeding How should I use this medicine? This medicine is for external use only. Do not take by mouth. Follow the directions on the prescription label. Wash your hands before and after use. Apply a thin film of medicine to the affected area. Do not cover with a bandage or dressing unless your doctor or health care professional tells you to. Do not use on healthy skin or over large areas of skin. Do not get this medicine in your eyes. If you do, rinse out with plenty of cool tap water. Do not to use more medicine than prescribed. Do not use your medicine more often than directed or for more than 14 days. Talk to your pediatrician regarding the use of this medicine in children. Special care may be needed. While this drug may be prescribed for children as young as 59 years of age for selected conditions, precautions do apply. Do not use this medicine for the treatment of diaper rash unless directed to do so by your doctor or health care professional. If applying this medicine to the diaper area of a child, do not cover with tight-fitting diapers or plastic pants. This may increase the  amount of medicine that passes through the skin and increase the risk of serious side effects. Elderly patients are more likely to have damaged skin through aging, and this may increase side effects. This medicine should only be used for brief periods and infrequently in older patients. Overdosage: If you think you have taken too much of this medicine contact a poison control center or emergency room at once. NOTE: This medicine is only for you. Do not share this medicine with others. What if I miss a dose? If you miss a dose, use it as soon as you can. If it is almost time for your next dose, use only that dose. Do not use double or extra doses. What may interact with this medicine? Interactions are not expected. Do not use any other skin products on the affected area without asking your  doctor or health care professional. This list may not describe all possible interactions. Give your health care provider a list of all the medicines, herbs, non-prescription drugs, or dietary supplements you use. Also tell them if you smoke, drink alcohol, or use illegal drugs. Some items may interact with your medicine. What should I watch for while using this medicine? Tell your doctor or health care professional if your symptoms do not start to get better within 7 days or if they get worse. Tell your doctor or health care professional if you are exposed to anyone with measles or chickenpox, or if you develop sores or blisters that do not heal properly. What side effects may I notice from receiving this medicine? Side effects that you should report to your doctor or health care professional as soon as possible: -allergic reactions like skin rash, itching or hives, swelling of the face, lips, or tongue -burning feeling on the skin -dark red spots on the skin -infection -lack of healing of skin condition -painful, red, pus filled blisters in hair follicles -thinning of the skin Side effects that usually do not  require medical attention (report to your doctor or health care professional if they continue or are bothersome): -dry skin, irritation -unusual increased growth of hair on the face or body This list may not describe all possible side effects. Call your doctor for medical advice about side effects. You may report side effects to FDA at 1-800-FDA-1088. Where should I keep my medicine? Keep out of the reach of children. Store at room temperature between 15 and 30 degrees C (59 and 86 degrees F). Do not freeze. Throw away any unused medicine after the expiration date. NOTE: This sheet is a summary. It may not cover all possible information. If you have questions about this medicine, talk to your doctor, pharmacist, or health care provider.  2019 Elsevier/Gold Standard (2007-06-06 16:08:48)

## 2018-04-20 ENCOUNTER — Encounter: Payer: Self-pay | Admitting: Family Medicine

## 2018-04-20 DIAGNOSIS — R21 Rash and other nonspecific skin eruption: Secondary | ICD-10-CM | POA: Insufficient documentation

## 2018-04-20 DIAGNOSIS — L299 Pruritus, unspecified: Secondary | ICD-10-CM | POA: Insufficient documentation

## 2018-04-28 ENCOUNTER — Ambulatory Visit: Payer: Self-pay | Admitting: Family Medicine

## 2018-05-20 ENCOUNTER — Ambulatory Visit: Payer: Self-pay | Admitting: Family Medicine

## 2018-06-03 NOTE — Telephone Encounter (Signed)
Message sent to provider 

## 2018-06-09 ENCOUNTER — Other Ambulatory Visit: Payer: Self-pay | Admitting: Family Medicine

## 2018-06-16 ENCOUNTER — Other Ambulatory Visit: Payer: Self-pay | Admitting: Family Medicine

## 2018-06-16 NOTE — Telephone Encounter (Signed)
Medication sent to pharmacy  

## 2018-07-21 ENCOUNTER — Ambulatory Visit: Payer: Self-pay | Admitting: Family Medicine

## 2018-07-24 ENCOUNTER — Telehealth: Payer: Self-pay

## 2018-07-24 NOTE — Telephone Encounter (Signed)
Left a vm for patient to callback and do screening

## 2018-07-25 ENCOUNTER — Ambulatory Visit: Payer: Self-pay | Admitting: Family Medicine

## 2018-07-28 ENCOUNTER — Other Ambulatory Visit: Payer: Self-pay | Admitting: Family Medicine

## 2018-07-31 ENCOUNTER — Telehealth: Payer: Self-pay

## 2018-07-31 NOTE — Telephone Encounter (Signed)
Left a vm for patient to callback to do the screening before appointment  

## 2018-08-01 ENCOUNTER — Other Ambulatory Visit: Payer: Self-pay

## 2018-08-01 ENCOUNTER — Encounter: Payer: Self-pay | Admitting: Family Medicine

## 2018-08-01 ENCOUNTER — Ambulatory Visit (INDEPENDENT_AMBULATORY_CARE_PROVIDER_SITE_OTHER): Payer: Medicaid Other | Admitting: Family Medicine

## 2018-08-01 VITALS — BP 128/68 | HR 60 | Temp 98.1°F | Ht 63.0 in | Wt 240.0 lb

## 2018-08-01 DIAGNOSIS — E785 Hyperlipidemia, unspecified: Secondary | ICD-10-CM | POA: Diagnosis not present

## 2018-08-01 DIAGNOSIS — Z131 Encounter for screening for diabetes mellitus: Secondary | ICD-10-CM

## 2018-08-01 DIAGNOSIS — Z09 Encounter for follow-up examination after completed treatment for conditions other than malignant neoplasm: Secondary | ICD-10-CM

## 2018-08-01 DIAGNOSIS — I10 Essential (primary) hypertension: Secondary | ICD-10-CM

## 2018-08-01 DIAGNOSIS — F411 Generalized anxiety disorder: Secondary | ICD-10-CM

## 2018-08-01 DIAGNOSIS — R8290 Unspecified abnormal findings in urine: Secondary | ICD-10-CM

## 2018-08-01 DIAGNOSIS — N390 Urinary tract infection, site not specified: Secondary | ICD-10-CM

## 2018-08-01 LAB — POCT URINALYSIS DIP (MANUAL ENTRY)
Bilirubin, UA: NEGATIVE
Glucose, UA: NEGATIVE mg/dL
Ketones, POC UA: NEGATIVE mg/dL
Nitrite, UA: NEGATIVE
Protein Ur, POC: NEGATIVE mg/dL
Spec Grav, UA: 1.015 (ref 1.010–1.025)
Urobilinogen, UA: 1 E.U./dL
pH, UA: 6.5 (ref 5.0–8.0)

## 2018-08-01 LAB — POCT GLYCOSYLATED HEMOGLOBIN (HGB A1C): Hemoglobin A1C: 5.9 % — AB (ref 4.0–5.6)

## 2018-08-01 MED ORDER — SULFAMETHOXAZOLE-TRIMETHOPRIM 800-160 MG PO TABS: 1.0000 | ORAL_TABLET | Freq: Two times a day (BID) | ORAL | 0 refills | Status: DC

## 2018-08-01 MED ORDER — CLONIDINE HCL 0.1 MG PO TABS: 0.2000 mg | ORAL_TABLET | Freq: Once | ORAL | Status: DC

## 2018-08-01 MED ORDER — FISH OIL 1000 MG PO CAPS: 1000.0000 mg | ORAL_CAPSULE | Freq: Every day | ORAL | 6 refills | Status: DC

## 2018-08-01 NOTE — Progress Notes (Signed)
[facility]   Established Patient Office Visit  Subjective:  Patient ID: Kathleen Herrera, female    DOB: November 18, 1960  Age: 58 y.o. MRN: [id number]  CC:  Chief Complaint  Patient presents with  . Follow-up    Chronic condition     HPI Kathleen Herrera is a 58 year old female who presents for follow up today.   Past Medical History:  Diagnosis Date  . Arthritis   . Asthma   . 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 Bipolar disorder (HCC)   . Bronchitis   . Depression   . Fibromyalgia Y-2  . Fibromyalgia unknown  . Hyperlipemia   . Hypertension   . Post traumatic stress disorder   . Sinus congestion      Current Status: Since her last office visit, she is doing well with no complaints. She has adapted to smoking marijuana for pain management because of her past history of pain.  She wants to discontinue cholesterol medication also. She denies visual changes, chest pain, cough, shortness of breath, heart palpitations, and falls. She has occasional headaches and dizziness with position changes. Denies severe headaches, confusion, seizures, double vision, and blurred vision, nausea and vomiting.  She denies fevers, chills, fatigue, recent infections, weight loss, and night sweats. She has not had any visual changes, and falls. No chest pain, heart palpitations, cough and shortness of breath reported. No reports of GI problems such as diarrhea, and constipation. She has no reports of blood in stools, dysuria and hematuria. No depression or anxiety reported.   Past Surgical History:  Procedure Laterality Date  . ABDOMINAL HYSTERECTOMY      Family History  Problem Relation Age of Onset  . Dementia Mother   . Prostate cancer Father   . Dementia Father   . Prostate cancer Other   . CAD Other     Social History   Socioeconomic History  . Marital status: Single    Spouse name: Not on file  . Number of children: Not on file  . Years of  education: Not on file  . Highest education level: Not on file  Occupational History  . Not on file  Social Needs  . Financial resource strain: Not on file  . Food insecurity    Worry: Not on file    Inability: Not on file  . Transportation needs    Medical: Not on file    Non-medical: Not on file  Tobacco Use  . Smoking status: Current Every Day Smoker    Packs/day: 0.25    Years: 37.00    Pack years: 9.25    Types: Cigarettes  . Smokeless tobacco: Never Used  Substance and Sexual Activity  . Alcohol use: Yes    Comment: occasionally  . Drug use: No    Comment: recovering crack addict  . Sexual activity: Not Currently  Lifestyle  . Physical activity    Days per week: Not on file    Minutes per session: Not on file  . Stress: Not on file  Relationships  . Social Musicianconnections    Talks on phone: Not on file    Gets together: Not on file    Attends religious service: Not on file    Active member of club or organization: Not on file    Attends meetings of clubs or organizations: Not on file    Relationship status: Not on file  . Intimate partner violence  Fear of current or ex partner: Not on file    Emotionally abused: Not on file    Physically abused: Not on file    Forced sexual activity: Not on file  Other Topics Concern  . Not on file  Social History Narrative  . Not on file    Outpatient Medications Prior to Visit  Medication Sig Dispense Refill  . amLODipine (NORVASC) 10 MG tablet TAKE 1 TABLET (10 MG TOTAL) BY MOUTH DAILY. 90 tablet 3  . busPIRone (BUSPAR) 15 MG tablet Take 15 mg by mouth 3 (three) times daily.    . clotrimazole-betamethasone (LOTRISONE) cream APPLY ONE APPLICATION TOPICALLY TWICE A DAY 90 g 1  . furosemide (LASIX) 20 MG tablet TAKE 1 TABLET (20 MG TOTAL) BY MOUTH DAILY. 30 tablet 3  . gabapentin (NEURONTIN) 300 MG capsule Take 2 capsules 2 times daily. 120 capsule 3  . Guaifenesin 1200 MG TB12 Take 1 tablet (1,200 mg total) by mouth 2  (two) times daily. 20 each 0  . hydrocortisone cream 0.5 % Apply 1 application topically 2 (two) times daily. 30 g 2  . hydrOXYzine (ATARAX/VISTARIL) 25 MG tablet Take 1 tablet (25 mg total) by mouth 3 (three) times daily as needed for itching or anxiety. (Patient taking differently: Take 25 mg by mouth daily. ) 30 tablet 1  . lamoTRIgine (LAMICTAL) 25 MG tablet Take 1 tablet (25 mg total) by mouth 2 (two) times daily. For mood control 60 tablet 0  . lithium carbonate 300 MG capsule Take 1 capsule (300 mg total) by mouth every morning. For mood control (Patient taking differently: Take 300-600 mg by mouth See admin instructions. Take 300 mg by mouth in the morning and 600 mg by mouth at bedtime) 30 capsule 0  . metoprolol succinate (TOPROL-XL) 50 MG 24 hr tablet TAKE TWO TABLETS (100 MG TOTAL) BY MOUTH DAILY. TAKE WITH OR IMMEDIATELY FOLLOWING A MEAL. 180 tablet 7  . nystatin (MYCOSTATIN/NYSTOP) powder APPLY TWO SPRINKLES OF POWDER TO THE AFFECTED INNER THIGHS TO PREVENT ITCHING AND FUNGAL RASH (Patient taking differently: Apply 1 g topically daily as needed (itching). APPLY TWO SPRINKLES OF POWDER TO THE AFFECTED INNER THIGHS TO PREVENT ITCHING AND FUNGAL RASH) 30 g 2  . pantoprazole (PROTONIX) 40 MG tablet TAKE ONE TABLET BY MOUTH TWICE A DAY BEFORE A MEAL ( IN THE MORNING AND IN THE EVENING ) 60 tablet 5  . PARoxetine (PAXIL) 30 MG tablet Take 30 mg by mouth every morning.    Marland Kitchen PROAIR HFA 108 (90 Base) MCG/ACT inhaler INHALE TWO PUFFS INTO THE LUNGS EVERY 6 HOURS AS NEEDED FOR WHEEZING OR SHORTNESS OF BREATH. (Patient taking differently: Inhale 2 puffs into the lungs every 6 (six) hours as needed for wheezing or shortness of breath. ) 8.5 Inhaler 3  . promethazine-dextromethorphan (PROMETHAZINE-DM) 6.25-15 MG/5ML syrup Take 5-10 mLs by mouth 4 (four) times daily as needed for cough. 240 mL 0  . simvastatin (ZOCOR) 20 MG tablet TAKE ONE TABLET   (20 MG TOTAL ) BY MOUTH   AT BEDTIME (Patient taking  differently: Take 20 mg by mouth at bedtime. ) 90 tablet 3  . solifenacin (VESICARE) 5 MG tablet TAKE 1 TABLET (5 MG TOTAL) BY MOUTH DAILY. 90 tablet 11  . SYMBICORT 80-4.5 MCG/ACT inhaler INHALE TWO PUFFS INTO THE LUNGS TWO TIMES DAILY. (Patient taking differently: Inhale 2 puffs into the lungs 2 (two) times daily. ) 1 Inhaler 1  . gabapentin (NEURONTIN) 300 MG capsule TAKE TWO  CAPSULES BY MOUTH THREE TIMES A DAY 180 capsule 0  . GNP ASPIRIN 325 MG tablet TAKE 1 TABLET (325 MG TOTAL) BY MOUTH DAILY. (Patient not taking: No sig reported) 30 tablet 6  . meclizine (ANTIVERT) 25 MG tablet Take 1 tablet (25 mg total) by mouth 3 (three) times daily as needed for dizziness. (Patient not taking: Reported on 08/01/2018) 30 tablet 0  . potassium chloride SA (K-DUR,KLOR-CON) 20 MEQ tablet Take 1 tablet (20 mEq total) by mouth daily. (Patient not taking: Reported on 08/01/2018) 3 tablet 0   No facility-administered medications prior to visit.     No Known Allergies  ROS Review of Systems  Constitutional: Negative.   HENT: Negative.   Eyes: Negative.   Respiratory: Negative.   Cardiovascular: Negative.   Gastrointestinal: Positive for abdominal distention (obese).  Endocrine: Negative.   Genitourinary: Negative.   Musculoskeletal: Positive for arthralgias (generalized).  Skin: Negative.   Allergic/Immunologic: Negative.   Neurological: Negative.   Hematological: Negative.   Psychiatric/Behavioral: Negative.       Objective:    Physical Exam  Constitutional: She is oriented to person, place, and time. She appears well-developed and well-nourished.  HENT:  Head: Normocephalic and atraumatic.  Eyes: Conjunctivae are normal.  Neck: Normal range of motion. Neck supple.  Cardiovascular: Normal rate, regular rhythm, normal heart sounds and intact distal pulses.  Pulmonary/Chest: Effort normal and breath sounds normal.  Abdominal: Soft. Bowel sounds are normal.  Musculoskeletal: Normal range of  motion.  Neurological: She is alert and oriented to person, place, and time. She has normal reflexes.  Skin: Skin is warm and dry.  Psychiatric: She has a normal mood and affect. Her behavior is normal. Judgment and thought content normal.  Nursing note and vitals reviewed.   BP 128/68 (BP Location: Left Arm, Patient Position: Sitting, Cuff Size: Large)   Pulse 60   Temp 98.1 F (36.7 C) (Oral)   Ht 5\' 3"  (1.6 m)   Wt 240 lb (108.9 kg)   SpO2 99%   BMI 42.51 kg/m  Wt Readings from Last 3 Encounters:  08/01/18 240 lb (108.9 kg)  04/18/18 210 lb (95.3 kg)  03/21/18 202 lb 9.6 oz (91.9 kg)     Health Maintenance Due  Topic Date Due  . PAP SMEAR-Modifier  04/21/1980  . COLONOSCOPY  04/21/2009  . MAMMOGRAM  01/23/2018    There are no preventive care reminders to display for this patient.  Lab Results  Component Value Date   TSH 1.501 02/27/2017   Lab Results  Component Value Date   WBC 7.7 01/20/2018   HGB 13.2 01/20/2018   HCT 44.1 01/20/2018   MCV 86.6 01/20/2018   PLT 280 01/20/2018   Lab Results  Component Value Date   NA 141 01/20/2018   K 3.4 (L) 01/20/2018   CO2 25 01/20/2018   GLUCOSE 189 (H) 01/20/2018   BUN <5 (L) 01/20/2018   CREATININE 0.75 01/20/2018   BILITOT 0.2 (L) 03/18/2017   ALKPHOS 72 03/18/2017   AST 26 03/18/2017   ALT 18 03/18/2017   PROT 7.4 03/18/2017   ALBUMIN 3.5 03/18/2017   CALCIUM 8.9 01/20/2018   ANIONGAP 11 01/20/2018   Lab Results  Component Value Date   CHOL 204 (H) 11/01/2016   Lab Results  Component Value Date   HDL 64 11/01/2016   Lab Results  Component Value Date   LDLCALC 87 11/01/2016   Lab Results  Component Value Date   TRIG 266 (H)  11/01/2016   Lab Results  Component Value Date   CHOLHDL 3.2 11/01/2016   Lab Results  Component Value Date   HGBA1C 5.9 (A) 08/01/2018      Assessment & Plan:   1. Essential hypertension The current medical regimen is effective; blood pressure is stable at  128/68  today; continue present plan and medications as prescribed. She will continue to decrease high sodium intake, excessive alcohol intake, increase potassium intake, smoking cessation, and increase physical activity of at least 30 minutes of cardio activity daily. She will continue to follow Heart Healthy or DASH diet.  2. Hyperlipidemia, unspecified hyperlipidemia type We will discontinue cholesterol medication to substitute Fish Oil per patient request today  - Omega-3 Fatty Acids (FISH OIL) 1000 MG CAPS; Take 1 capsule (1,000 mg total) by mouth daily.  Dispense: 30 capsule; Refill: 6  3. Generalized anxiety disorder  4. Screening for diabetes mellitus Hgb A1c is stable at 5.9 today. She will continue to decrease foods/beverages high in sugars and carbs and follow Heart Healthy or DASH diet. Increase physical activity to at least 30 minutes cardio exercise daily.  - POCT glycosylated hemoglobin (Hb A1C) - POCT urinalysis dipstick  5. Abnormal urinalysis - Urine Culture  6. Urinary tract infection without hematuria, site unspecified We will initiate bactrim today.  - sulfamethoxazole-trimethoprim (BACTRIM DS) 800-160 MG tablet; Take 1 tablet by mouth 2 (two) times daily.  Dispense: 14 tablet; Refill: 0  7. Follow up She will follow up in 3 months.   Meds ordered this encounter  Medications  . DISCONTD: cloNIDine (CATAPRES) tablet 0.2 mg  . sulfamethoxazole-trimethoprim (BACTRIM DS) 800-160 MG tablet    Sig: Take 1 tablet by mouth 2 (two) times daily.    Dispense:  14 tablet    Refill:  0  . Omega-3 Fatty Acids (FISH OIL) 1000 MG CAPS    Sig: Take 1 capsule (1,000 mg total) by mouth daily.    Dispense:  30 capsule    Refill:  6    Orders Placed This Encounter  Procedures  . Urine Culture  . POCT glycosylated hemoglobin (Hb A1C)  . POCT urinalysis dipstick    Referral Orders  No referral(s) requested today    Raliegh IpNatalie Kimberle Stanfill,  MSN, FNP-BC [facility] [address]  [place], [place] [id number] [phone]  Problem List Items Addressed This Visit      Cardiovascular and Mediastinum   Essential hypertension - Primary     Other   Generalized anxiety disorder   Hyperlipidemia   Relevant Medications   Omega-3 Fatty Acids (FISH OIL) 1000 MG CAPS    Other Visit Diagnoses    Screening for diabetes mellitus       Relevant Orders   POCT glycosylated hemoglobin (Hb A1C) (Completed)   POCT urinalysis dipstick (Completed)   Abnormal urinalysis       Relevant Orders   Urine Culture   Urinary tract infection without hematuria, site unspecified       Relevant Medications   sulfamethoxazole-trimethoprim (BACTRIM DS) 800-160 MG tablet   Follow up          Meds ordered this encounter  Medications  . DISCONTD: cloNIDine (CATAPRES) tablet 0.2 mg  . sulfamethoxazole-trimethoprim (BACTRIM DS) 800-160 MG tablet    Sig: Take 1 tablet by mouth 2 (two) times daily.    Dispense:  14 tablet    Refill:  0  . Omega-3 Fatty Acids (FISH OIL) 1000 MG CAPS  Sig: Take 1 capsule (1,000 mg total) by mouth daily.    Dispense:  30 capsule    Refill:  6    Follow-up: Return in about 3 months (around 11/01/2018).    Kallie Locks, FNP

## 2018-08-03 LAB — URINE CULTURE

## 2018-08-28 ENCOUNTER — Other Ambulatory Visit: Payer: Self-pay | Admitting: Internal Medicine

## 2018-09-02 ENCOUNTER — Telehealth: Payer: Self-pay | Admitting: Family Medicine

## 2018-09-03 ENCOUNTER — Telehealth: Payer: Self-pay | Admitting: Family Medicine

## 2018-09-03 NOTE — Telephone Encounter (Signed)
Sent to NP 

## 2018-09-03 NOTE — Telephone Encounter (Signed)
Multiple attempts to contact patient concerning questions about medications. Unable to leave voice message.

## 2018-09-06 DIAGNOSIS — R42 Dizziness and giddiness: Secondary | ICD-10-CM

## 2018-09-06 HISTORY — DX: Dizziness and giddiness: R42

## 2018-09-09 ENCOUNTER — Other Ambulatory Visit: Payer: Self-pay | Admitting: Family Medicine

## 2018-09-09 ENCOUNTER — Encounter: Payer: Self-pay | Admitting: Family Medicine

## 2018-09-09 DIAGNOSIS — E7800 Pure hypercholesterolemia, unspecified: Secondary | ICD-10-CM

## 2018-09-09 DIAGNOSIS — R42 Dizziness and giddiness: Secondary | ICD-10-CM

## 2018-09-09 MED ORDER — MECLIZINE HCL 25 MG PO TABS: 25.0000 mg | ORAL_TABLET | Freq: Three times a day (TID) | ORAL | 3 refills | Status: DC | PRN

## 2018-09-09 MED ORDER — SIMVASTATIN 20 MG PO TABS: 20.0000 mg | ORAL_TABLET | Freq: Every day | ORAL | 3 refills | Status: DC

## 2018-09-09 NOTE — Telephone Encounter (Signed)
Patient would like to be put back on her cholesterol medication because the fish oil tablets are to big to swallow.Patient would also like a refill on Meclizine for the vertigo. Patient was prescribed this medication from ED a while ago.

## 2018-09-09 NOTE — Telephone Encounter (Signed)
Left another vm for patient to callback  

## 2018-09-10 NOTE — Telephone Encounter (Signed)
Patient notified

## 2018-09-11 ENCOUNTER — Other Ambulatory Visit: Payer: Self-pay | Admitting: Internal Medicine

## 2018-09-16 ENCOUNTER — Other Ambulatory Visit: Payer: Self-pay

## 2018-09-16 MED ORDER — SOLIFENACIN SUCCINATE 5 MG PO TABS: ORAL_TABLET | ORAL | 11 refills | Status: DC

## 2018-09-16 NOTE — Progress Notes (Unsigned)
Medication sent to pharmacy  

## 2018-10-09 ENCOUNTER — Other Ambulatory Visit: Payer: Self-pay | Admitting: Family Medicine

## 2018-10-09 DIAGNOSIS — R42 Dizziness and giddiness: Secondary | ICD-10-CM

## 2018-10-09 DIAGNOSIS — I10 Essential (primary) hypertension: Secondary | ICD-10-CM

## 2018-11-03 ENCOUNTER — Ambulatory Visit: Payer: Self-pay | Admitting: Family Medicine

## 2018-11-04 ENCOUNTER — Ambulatory Visit (INDEPENDENT_AMBULATORY_CARE_PROVIDER_SITE_OTHER): Payer: Medicaid Other | Admitting: Family Medicine

## 2018-11-04 ENCOUNTER — Other Ambulatory Visit: Payer: Self-pay

## 2018-11-04 VITALS — Ht 63.0 in | Wt 240.6 lb

## 2018-11-04 DIAGNOSIS — R232 Flushing: Secondary | ICD-10-CM

## 2018-11-04 DIAGNOSIS — R42 Dizziness and giddiness: Secondary | ICD-10-CM

## 2018-11-04 DIAGNOSIS — I10 Essential (primary) hypertension: Secondary | ICD-10-CM | POA: Diagnosis not present

## 2018-11-04 DIAGNOSIS — Z23 Encounter for immunization: Secondary | ICD-10-CM | POA: Diagnosis not present

## 2018-11-04 DIAGNOSIS — Z1211 Encounter for screening for malignant neoplasm of colon: Secondary | ICD-10-CM

## 2018-11-04 DIAGNOSIS — Z1239 Encounter for other screening for malignant neoplasm of breast: Secondary | ICD-10-CM

## 2018-11-04 DIAGNOSIS — F411 Generalized anxiety disorder: Secondary | ICD-10-CM | POA: Diagnosis not present

## 2018-11-04 DIAGNOSIS — Z09 Encounter for follow-up examination after completed treatment for conditions other than malignant neoplasm: Secondary | ICD-10-CM

## 2018-11-04 NOTE — Progress Notes (Signed)
[facility]   Established Patient Office Visit  Subjective:  Patient ID: Kathleen Herrera, female    DOB: 04/05/1960  Age: 58 y.o. MRN: [id number]  CC:  Chief Complaint  Patient presents with  . Follow-up    3 month follow up , discuss medication refills     HPI Kathleen Herrera is a 58 year old female who presents for Follow Up today.   Past Medical History:  Diagnosis Date  . Arthritis   . Asthma   . 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 Bipolar disorder (HCC)   . Bronchitis   . Depression   . Dizziness 09/2018  . Fibromyalgia Y-2  . Fibromyalgia unknown  . Hyperlipemia   . Hypertension   . Post traumatic stress disorder   . Sinus congestion    Current Status: Since her last office visit, she is doing well with no complaints. She has began to experience hot flashes recently. she denies visual changes, chest pain, cough, shortness of breath, heart palpitations, and falls. She has occasional headaches and dizziness with position changes. Denies severe headaches, confusion, seizures, double vision, and blurred vision, nausea and vomiting. She continues to follow up with Psychiatry. She denies suicidal ideations, homicidal ideations, or auditory hallucinations. She denies fevers, chills, fatigue, recent infections, weight loss, and night sweats. She has not had any headaches, visual changes, dizziness, and falls. No chest pain, heart palpitations, cough and shortness of breath reported. No reports of GI problems such as diarrhea, and constipation. She has no reports of blood in stools, dysuria and hematuria. She denies pain today.   Past Surgical History:  Procedure Laterality Date  . ABDOMINAL HYSTERECTOMY      Family History  Problem Relation Age of Onset  . Dementia Mother   . Prostate cancer Father   . Dementia Father   . Prostate cancer Other   . CAD Other     Social History   Socioeconomic History  . Marital status: Single     Spouse name: Not on file  . Number of children: Not on file  . Years of education: Not on file  . Highest education level: Not on file  Occupational History  . Not on file  Social Needs  . Financial resource strain: Not on file  . Food insecurity    Worry: Not on file    Inability: Not on file  . Transportation needs    Medical: Not on file    Non-medical: Not on file  Tobacco Use  . Smoking status: Current Every Day Smoker    Packs/day: 0.25    Years: 37.00    Pack years: 9.25    Types: Cigarettes  . Smokeless tobacco: Never Used  Substance and Sexual Activity  . Alcohol use: Yes    Comment: occasionally  . Drug use: No    Comment: recovering crack addict  . Sexual activity: Not Currently  Lifestyle  . Physical activity    Days per week: Not on file    Minutes per session: Not on file  . Stress: Not on file  Relationships  . Social Musicianconnections    Talks on phone: Not on file    Gets together: Not on file    Attends religious service: Not on file    Active member of club or organization: Not on file    Attends meetings of clubs or organizations: Not on file    Relationship status: Not on file  .  Intimate partner violence    Fear of current or ex partner: Not on file    Emotionally abused: Not on file    Physically abused: Not on file    Forced sexual activity: Not on file  Other Topics Concern  . Not on file  Social History Narrative  . Not on file    Outpatient Medications Prior to Visit  Medication Sig Dispense Refill  . amLODipine (NORVASC) 10 MG tablet TAKE ONE TABLET BY MOUTH DAILY 90 tablet 3  . busPIRone (BUSPAR) 15 MG tablet Take 15 mg by mouth 3 (three) times daily.    . clotrimazole-betamethasone (LOTRISONE) cream APPLY ONE APPLICATION TOPICALLY TWICE A DAY 90 g 1  . furosemide (LASIX) 20 MG tablet TAKE 1 TABLET (20 MG TOTAL) BY MOUTH DAILY. 30 tablet 3  . hydrocortisone cream 0.5 % Apply 1 application topically 2 (two) times daily. 30 g 2  .  hydrOXYzine (ATARAX/VISTARIL) 25 MG tablet Take 1 tablet (25 mg total) by mouth 3 (three) times daily as needed for itching or anxiety. (Patient taking differently: Take 25 mg by mouth daily. ) 30 tablet 1  . lithium carbonate 300 MG capsule Take 1 capsule (300 mg total) by mouth every morning. For mood control (Patient taking differently: Take 300-600 mg by mouth See admin instructions. Take 300 mg by mouth in the morning and 600 mg by mouth at bedtime) 30 capsule 0  . meclizine (ANTIVERT) 25 MG tablet TAKE ONE TABLET BY MOUTH THREE TIMES A DAY AS NEEDED FOR DIZZINESS 30 tablet 2  . metoprolol succinate (TOPROL-XL) 50 MG 24 hr tablet TAKE TWO TABLETS (100 MG TOTAL) BY MOUTH DAILY. TAKE WITH OR IMMEDIATELY FOLLOWING A MEAL. 180 tablet 7  . pantoprazole (PROTONIX) 40 MG tablet TAKE ONE TABLET BY MOUTH TWICE A DAY BEFORE A MEAL ( IN THE MORNING AND IN THE EVENING ) 60 tablet 5  . PARoxetine (PAXIL) 30 MG tablet Take 30 mg by mouth every morning.    Marland Kitchen PROAIR HFA 108 (90 Base) MCG/ACT inhaler INHALE TWO PUFFS INTO THE LUNGS EVERY 6 HOURS AS NEEDED FOR WHEEZING OR SHORTNESS OF BREATH. (Patient taking differently: Inhale 2 puffs into the lungs every 6 (six) hours as needed for wheezing or shortness of breath. ) 8.5 Inhaler 3  . simvastatin (ZOCOR) 20 MG tablet Take 1 tablet (20 mg total) by mouth at bedtime. 30 tablet 3  . solifenacin (VESICARE) 5 MG tablet TAKE 1 TABLET (5 MG TOTAL) BY MOUTH DAILY. 90 tablet 11  . SYMBICORT 80-4.5 MCG/ACT inhaler INHALE TWO PUFFS INTO THE LUNGS TWO TIMES DAILY. (Patient taking differently: Inhale 2 puffs into the lungs 2 (two) times daily. ) 1 Inhaler 1  . gabapentin (NEURONTIN) 300 MG capsule Take 2 capsules 2 times daily. (Patient not taking: Reported on 11/04/2018) 120 capsule 3  . GNP ASPIRIN 325 MG tablet TAKE 1 TABLET (325 MG TOTAL) BY MOUTH DAILY. (Patient not taking: No sig reported) 30 tablet 6  . Guaifenesin 1200 MG TB12 Take 1 tablet (1,200 mg total) by mouth 2  (two) times daily. (Patient not taking: Reported on 11/04/2018) 20 each 0  . lamoTRIgine (LAMICTAL) 25 MG tablet Take 1 tablet (25 mg total) by mouth 2 (two) times daily. For mood control 60 tablet 0  . nystatin (MYCOSTATIN/NYSTOP) powder APPLY TWO SPRINKLES OF POWDER TO THE AFFECTED INNER THIGHS TO PREVENT ITCHING AND FUNGAL RASH (Patient not taking: Reported on 11/04/2018) 30 g 2  . Omega-3 Fatty Acids (FISH  OIL) 1000 MG CAPS Take 1 capsule (1,000 mg total) by mouth daily. (Patient not taking: Reported on 11/04/2018) 30 capsule 6  . potassium chloride SA (K-DUR,KLOR-CON) 20 MEQ tablet Take 1 tablet (20 mEq total) by mouth daily. (Patient not taking: Reported on 08/01/2018) 3 tablet 0  . promethazine-dextromethorphan (PROMETHAZINE-DM) 6.25-15 MG/5ML syrup Take 5-10 mLs by mouth 4 (four) times daily as needed for cough. (Patient not taking: Reported on 11/04/2018) 240 mL 0  . solifenacin (VESICARE) 5 MG tablet TAKE 1 TABLET (5 MG TOTAL) BY MOUTH DAILY. 90 tablet 11  . sulfamethoxazole-trimethoprim (BACTRIM DS) 800-160 MG tablet Take 1 tablet by mouth 2 (two) times daily. 14 tablet 0   No facility-administered medications prior to visit.     No Known Allergies  ROS Review of Systems  Constitutional: Negative.   HENT: Negative.   Eyes: Negative.   Respiratory: Positive for shortness of breath (occasional ).   Cardiovascular: Negative.   Gastrointestinal: Negative.   Endocrine: Negative.   Genitourinary: Negative.   Musculoskeletal: Positive for arthralgias (generalized joint pain).  Skin: Negative.   Allergic/Immunologic: Negative.   Neurological: Positive for dizziness (occasional ) and headaches (occasional ).  Hematological: Negative.   Psychiatric/Behavioral: Negative.       Objective:    Physical Exam  Constitutional: She is oriented to person, place, and time. She appears well-developed and well-nourished.  HENT:  Head: Atraumatic.  Right Ear: External ear normal.  Left Ear:  External ear normal.  Nose: Nose normal.  Mouth/Throat: Oropharynx is clear and moist.  Eyes: Conjunctivae are normal.  Neck: Normal range of motion. Neck supple.  Cardiovascular: Normal rate, regular rhythm, normal heart sounds and intact distal pulses.  Pulmonary/Chest: Effort normal and breath sounds normal.  Abdominal: Soft. Bowel sounds are normal. She exhibits distension (obese).  Musculoskeletal: Normal range of motion.  Neurological: She is alert and oriented to person, place, and time. She has normal reflexes.  Skin: Skin is warm and dry.  Psychiatric: She has a normal mood and affect. Her behavior is normal. Judgment and thought content normal.  Nursing note and vitals reviewed.   Ht 5\' 3"  (1.6 m)   Wt 240 lb 9.6 oz (109.1 kg)   BMI 42.62 kg/m  Wt Readings from Last 3 Encounters:  11/04/18 240 lb 9.6 oz (109.1 kg)  08/01/18 240 lb (108.9 kg)  04/18/18 210 lb (95.3 kg)     Health Maintenance Due  Topic Date Due  . PAP SMEAR-Modifier  04/21/1980  . COLONOSCOPY  04/21/2009  . MAMMOGRAM  01/23/2018    There are no preventive care reminders to display for this patient.  Lab Results  Component Value Date   TSH 1.501 02/27/2017   Lab Results  Component Value Date   WBC 7.7 01/20/2018   HGB 13.2 01/20/2018   HCT 44.1 01/20/2018   MCV 86.6 01/20/2018   PLT 280 01/20/2018   Lab Results  Component Value Date   NA 141 01/20/2018   K 3.4 (L) 01/20/2018   CO2 25 01/20/2018   GLUCOSE 189 (H) 01/20/2018   BUN <5 (L) 01/20/2018   CREATININE 0.75 01/20/2018   BILITOT 0.2 (L) 03/18/2017   ALKPHOS 72 03/18/2017   AST 26 03/18/2017   ALT 18 03/18/2017   PROT 7.4 03/18/2017   ALBUMIN 3.5 03/18/2017   CALCIUM 8.9 01/20/2018   ANIONGAP 11 01/20/2018   Lab Results  Component Value Date   CHOL 204 (H) 11/01/2016   Lab Results  Component Value  Date   HDL 64 11/01/2016   Lab Results  Component Value Date   LDLCALC 87 11/01/2016   Lab Results  Component Value  Date   TRIG 266 (H) 11/01/2016   Lab Results  Component Value Date   CHOLHDL 3.2 11/01/2016   Lab Results  Component Value Date   HGBA1C 5.9 (A) 08/01/2018      Assessment & Plan:   1. Essential hypertension Blood pressure is stable today. She will continue to take medications as prescribed, to decrease high sodium intake, excessive alcohol intake, increase potassium intake, smoking cessation, and increase physical activity of at least 30 minutes of cardio activity daily. She will continue to follow Heart Healthy or DASH diet.  2. Generalized anxiety disorder Stable today.  3. Hot flashes  4. Dizziness Stable today.   5. Screening for breast cancer - MM DIGITAL SCREENING BILATERAL; Future  6. Encounter for screening colonoscopy We will assess at next office visit.   7. Influenza vaccine needed - Flu Vaccine QUAD 6+ mos PF IM (Fluarix Quad PF)  8. Need for pneumococcal vaccination - Pneumococcal conjugate vaccine 13-valent IM  9. Follow up He will follow up in 3 months.   No orders of the defined types were placed in this encounter.  Orders Placed This Encounter  Procedures  . MM DIGITAL SCREENING BILATERAL  . Flu Vaccine QUAD 6+ mos PF IM (Fluarix Quad PF)  . Pneumococcal conjugate vaccine 13-valent IM   Referral Orders  No referral(s) requested today    Raliegh Ip,  MSN, FNP-BC [facility] [address] [place], [place] [address] [phone] fax   Problem List Items Addressed This Visit      Cardiovascular and Mediastinum   Essential hypertension - Primary     Other   Generalized anxiety disorder    Other Visit Diagnoses    Hot flashes       Dizziness       Screening for breast cancer       Relevant Orders   MM DIGITAL SCREENING BILATERAL   Encounter for screening colonoscopy       Influenza vaccine needed       Relevant Orders   Flu Vaccine QUAD 6+  mos PF IM (Fluarix Quad PF) (Completed)   Need for pneumococcal vaccination       Relevant Orders   Pneumococcal conjugate vaccine 13-valent IM (Completed)   Follow up          No orders of the defined types were placed in this encounter.   Follow-up: Return in about 3 months (around 02/03/2019).    Kallie Locks, FNP

## 2018-11-05 DIAGNOSIS — R42 Dizziness and giddiness: Secondary | ICD-10-CM | POA: Insufficient documentation

## 2018-11-05 DIAGNOSIS — R232 Flushing: Secondary | ICD-10-CM | POA: Insufficient documentation

## 2018-11-26 ENCOUNTER — Other Ambulatory Visit: Payer: Self-pay | Admitting: Family Medicine

## 2018-12-27 ENCOUNTER — Other Ambulatory Visit: Payer: Self-pay | Admitting: Family Medicine

## 2018-12-27 DIAGNOSIS — R42 Dizziness and giddiness: Secondary | ICD-10-CM

## 2019-01-13 ENCOUNTER — Ambulatory Visit
Admission: RE | Admit: 2019-01-13 | Discharge: 2019-01-13 | Disposition: A | Discharge status: Home / Self Care | Payer: Medicaid Other | Source: Ambulatory Visit | Attending: Family Medicine | Admitting: Family Medicine

## 2019-01-13 ENCOUNTER — Other Ambulatory Visit: Payer: Self-pay

## 2019-01-13 DIAGNOSIS — Z1239 Encounter for other screening for malignant neoplasm of breast: Secondary | ICD-10-CM

## 2019-01-19 ENCOUNTER — Other Ambulatory Visit: Payer: Self-pay | Admitting: Family Medicine

## 2019-01-19 DIAGNOSIS — E7800 Pure hypercholesterolemia, unspecified: Secondary | ICD-10-CM

## 2019-02-03 ENCOUNTER — Ambulatory Visit: Payer: Self-pay | Admitting: Family Medicine

## 2019-02-06 DIAGNOSIS — E785 Hyperlipidemia, unspecified: Secondary | ICD-10-CM

## 2019-02-06 HISTORY — DX: Hyperlipidemia, unspecified: E78.5

## 2019-02-09 ENCOUNTER — Other Ambulatory Visit: Payer: Self-pay | Admitting: Family Medicine

## 2019-02-09 DIAGNOSIS — I10 Essential (primary) hypertension: Secondary | ICD-10-CM

## 2019-02-09 NOTE — Telephone Encounter (Signed)
Requested medication (s) are due for refill today: yes  Requested medication (s) are on the active medication list: yes  Last refill:  01/06/2019  Future visit scheduled: yes  Notes to clinic:  Patient as upcoming appointment on 02/17/2019 Review for refill   Requested Prescriptions  Pending Prescriptions Disp Refills   metoprolol succinate (TOPROL-XL) 50 MG 24 hr tablet [Pharmacy Med Name: METOPROLOL SUCCINATE 50MG  TAB] 180 tablet 11    Sig: TAKE TWO TABLETS (100 MG TOTAL) BY MOUTH DAILY. TAKE WITH OR IMMEDIATELY FOLLOWING A MEAL.      There is no refill protocol information for this order

## 2019-02-10 ENCOUNTER — Ambulatory Visit: Payer: Self-pay | Admitting: Family Medicine

## 2019-02-17 ENCOUNTER — Ambulatory Visit (INDEPENDENT_AMBULATORY_CARE_PROVIDER_SITE_OTHER): Payer: Medicaid Other | Admitting: Family Medicine

## 2019-02-17 ENCOUNTER — Encounter: Payer: Self-pay | Admitting: Family Medicine

## 2019-02-17 ENCOUNTER — Other Ambulatory Visit: Payer: Self-pay

## 2019-02-17 VITALS — BP 140/64 | HR 60 | Temp 98.1°F | Ht 63.0 in | Wt 243.0 lb

## 2019-02-17 DIAGNOSIS — R7303 Prediabetes: Secondary | ICD-10-CM | POA: Diagnosis not present

## 2019-02-17 DIAGNOSIS — I10 Essential (primary) hypertension: Secondary | ICD-10-CM | POA: Diagnosis not present

## 2019-02-17 DIAGNOSIS — R8290 Unspecified abnormal findings in urine: Secondary | ICD-10-CM

## 2019-02-17 DIAGNOSIS — R232 Flushing: Secondary | ICD-10-CM

## 2019-02-17 DIAGNOSIS — Z1211 Encounter for screening for malignant neoplasm of colon: Secondary | ICD-10-CM

## 2019-02-17 DIAGNOSIS — Z0000 Encounter for general adult medical examination without abnormal findings: Secondary | ICD-10-CM

## 2019-02-17 DIAGNOSIS — B369 Superficial mycosis, unspecified: Secondary | ICD-10-CM

## 2019-02-17 DIAGNOSIS — F411 Generalized anxiety disorder: Secondary | ICD-10-CM | POA: Diagnosis not present

## 2019-02-17 DIAGNOSIS — Z09 Encounter for follow-up examination after completed treatment for conditions other than malignant neoplasm: Secondary | ICD-10-CM

## 2019-02-17 LAB — POCT URINALYSIS DIPSTICK
Bilirubin, UA: NEGATIVE
Blood, UA: NEGATIVE
Glucose, UA: NEGATIVE
Ketones, UA: NEGATIVE
Nitrite, UA: NEGATIVE
Protein, UA: NEGATIVE
Spec Grav, UA: 1.02 (ref 1.010–1.025)
Urobilinogen, UA: 0.2 E.U./dL
pH, UA: 6.5 (ref 5.0–8.0)

## 2019-02-17 LAB — GLUCOSE, POCT (MANUAL RESULT ENTRY): POC Glucose: 86 mg/dl (ref 70–99)

## 2019-02-17 LAB — POCT GLYCOSYLATED HEMOGLOBIN (HGB A1C): Hemoglobin A1C: 6 % — AB (ref 4.0–5.6)

## 2019-02-17 MED ORDER — SULFAMETHOXAZOLE-TRIMETHOPRIM 800-160 MG PO TABS: 1.0000 | ORAL_TABLET | Freq: Two times a day (BID) | ORAL | 0 refills | Status: AC

## 2019-02-17 MED ORDER — NYSTATIN 100000 UNIT/GM EX POWD: CUTANEOUS | 6 refills | Status: DC

## 2019-02-17 NOTE — Progress Notes (Signed)
Patient [facility]   Established Patient Office Visit  Subjective:  Patient ID: Kathleen Herrera, female    DOB: 1960-11-13  Age: 59 y.o. MRN: [id number]  CC:  Chief Complaint  Patient presents with  . Follow-up    HTN    HPI Kathleen Herrera is a 59 year old female who presents for Follow Up today.   Past Medical History:  Diagnosis Date  . Arthritis   . Asthma   . 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 Bipolar disorder ([place])   . Bronchitis   . Depression   . Dizziness 09/2018  . Fibromyalgia Y-2  . Fibromyalgia unknown  . Hyperlipemia   . Hypertension   . Post traumatic stress disorder   . Sinus congestion    Current Status: Since her last office visit, she is doing well with no complaints. She recently has had Mammogram on 01/13/2019 with negative results. She denies visual changes, chest pain, cough, shortness of breath, heart palpitations, and falls. She has occasional headaches and dizziness with position changes. Denies severe headaches, confusion, seizures, double vision, and blurred vision, nausea and vomiting. Her anxiety is mild today. She denies suicidal ideations, homicidal ideations, or auditory hallucinations. She denies fevers, chills, fatigue, recent infections, weight loss, and night sweats. No reports of GI problems such as diarrhea, and constipation. She has no reports of blood in stools, dysuria and hematuria. She denies pain today.   Past Surgical History:  Procedure Laterality Date  . ABDOMINAL HYSTERECTOMY      Family History  Problem Relation Age of Onset  . Dementia Mother   . Prostate cancer Father   . Dementia Father   . Prostate cancer Other   . CAD Other     Social History   Socioeconomic History  . Marital status: Single    Spouse name: Not on file  . Number of children: Not on file  . Years of education: Not on file  . Highest education level: Not on file  Occupational  History  . Not on file  Tobacco Use  . Smoking status: Current Every Day Smoker    Packs/day: 0.25    Years: 37.00    Pack years: 9.25    Types: Cigarettes  . Smokeless tobacco: Never Used  Substance and Sexual Activity  . Alcohol use: Yes    Comment: occasionally  . Drug use: No    Comment: recovering crack addict  . Sexual activity: Not Currently  Other Topics Concern  . Not on file  Social History Narrative  . Not on file   Social Determinants of Health   Financial Resource Strain:   . Difficulty of Paying Living Expenses: Not on file  Food Insecurity:   . Worried About Charity fundraiser in the Last Year: Not on file  . Ran Out of Food in the Last Year: Not on file  Transportation Needs:   . Lack of Transportation (Medical): Not on file  . Lack of Transportation (Non-Medical): Not on file  Physical Activity:   . Days of Exercise per Week: Not on file  . Minutes of Exercise per Session: Not on file  Stress:   . Feeling of Stress : Not on file  Social Connections:   . Frequency of Communication with Friends and Family: Not on file  . Frequency of Social Gatherings with Friends and Family: Not on file  . Attends Religious Services: Not on file  . Active Member  of Clubs or Organizations: Not on file  . Attends Archivist Meetings: Not on file  . Marital Status: Not on file  Intimate Partner Violence:   . Fear of Current or Ex-Partner: Not on file  . Emotionally Abused: Not on file  . Physically Abused: Not on file  . Sexually Abused: Not on file    Outpatient Medications Prior to Visit  Medication Sig Dispense Refill  . amLODipine (NORVASC) 10 MG tablet TAKE ONE TABLET BY MOUTH DAILY 90 tablet 3  . busPIRone (BUSPAR) 15 MG tablet Take 15 mg by mouth 3 (three) times daily.    . clotrimazole-betamethasone (LOTRISONE) cream APPLY ONE APPLICATION TOPICALLY TWICE A DAY 90 g 1  . furosemide (LASIX) 20 MG tablet TAKE 1 TABLET (20 MG TOTAL) BY MOUTH DAILY. 30  tablet 3  . gabapentin (NEURONTIN) 300 MG capsule Take 2 capsules 2 times daily. (Patient not taking: Reported on 11/04/2018) 120 capsule 3  . GNP ASPIRIN 325 MG tablet TAKE 1 TABLET (325 MG TOTAL) BY MOUTH DAILY. (Patient not taking: No sig reported) 30 tablet 6  . Guaifenesin 1200 MG TB12 Take 1 tablet (1,200 mg total) by mouth 2 (two) times daily. (Patient not taking: Reported on 11/04/2018) 20 each 0  . hydrocortisone cream 0.5 % Apply 1 application topically 2 (two) times daily. 30 g 2  . hydrOXYzine (ATARAX/VISTARIL) 25 MG tablet Take 1 tablet (25 mg total) by mouth 3 (three) times daily as needed for itching or anxiety. (Patient taking differently: Take 25 mg by mouth daily. ) 30 tablet 1  . lamoTRIgine (LAMICTAL) 25 MG tablet Take 1 tablet (25 mg total) by mouth 2 (two) times daily. For mood control 60 tablet 0  . lithium carbonate 300 MG capsule Take 1 capsule (300 mg total) by mouth every morning. For mood control (Patient taking differently: Take 300-600 mg by mouth See admin instructions. Take 300 mg by mouth in the morning and 600 mg by mouth at bedtime) 30 capsule 0  . meclizine (ANTIVERT) 25 MG tablet TAKE ONE TABLET BY MOUTH THREE TIMES A DAY AS NEEDED FOR DIZZINESS 30 tablet 2  . metoprolol succinate (TOPROL-XL) 50 MG 24 hr tablet TAKE TWO TABLETS (100 MG TOTAL) BY MOUTH DAILY. TAKE WITH OR IMMEDIATELY FOLLOWING A MEAL. 180 tablet 11  . nystatin (MYCOSTATIN/NYSTOP) powder APPLY TWO SPRINKLES OF POWDER TO THE AFFECTED INNER THIGHS TO PREVENT ITCHING AND FUNGAL RASH (Patient not taking: Reported on 11/04/2018) 30 g 2  . Omega-3 Fatty Acids (FISH OIL) 1000 MG CAPS Take 1 capsule (1,000 mg total) by mouth daily. (Patient not taking: Reported on 11/04/2018) 30 capsule 6  . pantoprazole (PROTONIX) 40 MG tablet TAKE ONE TABLET BY MOUTH TWICE A DAY BEFORE MEALS (IN THE MORNING AND IN THE EVENING) 60 tablet 4  . PARoxetine (PAXIL) 30 MG tablet Take 30 mg by mouth every morning.    . potassium  chloride SA (K-DUR,KLOR-CON) 20 MEQ tablet Take 1 tablet (20 mEq total) by mouth daily. (Patient not taking: Reported on 08/01/2018) 3 tablet 0  . PROAIR HFA 108 (90 Base) MCG/ACT inhaler INHALE TWO PUFFS INTO THE LUNGS EVERY 6 HOURS AS NEEDED FOR WHEEZING OR SHORTNESS OF BREATH. (Patient taking differently: Inhale 2 puffs into the lungs every 6 (six) hours as needed for wheezing or shortness of breath. ) 8.5 Inhaler 3  . promethazine-dextromethorphan (PROMETHAZINE-DM) 6.25-15 MG/5ML syrup Take 5-10 mLs by mouth 4 (four) times daily as needed for cough. (Patient not taking:  Reported on 11/04/2018) 240 mL 0  . simvastatin (ZOCOR) 20 MG tablet TAKE ONE TABLET BY MOUTH AT BEDTIME 30 tablet 2  . solifenacin (VESICARE) 5 MG tablet TAKE 1 TABLET (5 MG TOTAL) BY MOUTH DAILY. 90 tablet 11  . SYMBICORT 80-4.5 MCG/ACT inhaler INHALE TWO PUFFS INTO THE LUNGS TWO TIMES DAILY. (Patient taking differently: Inhale 2 puffs into the lungs 2 (two) times daily. ) 1 Inhaler 1   No facility-administered medications prior to visit.    No Known Allergies  ROS Review of Systems  Constitutional: Negative.   HENT: Negative.   Eyes: Negative.   Respiratory: Negative.   Cardiovascular: Negative.   Gastrointestinal: Positive for abdominal distention.  Endocrine: Negative.   Genitourinary: Negative.   Musculoskeletal: Positive for arthralgias (generalized joint pain).  Skin: Negative.   Allergic/Immunologic: Negative.   Neurological: Negative.   Hematological: Negative.   Psychiatric/Behavioral: Negative.       Objective:    Physical Exam  Constitutional: She is oriented to person, place, and time. She appears well-developed and well-nourished.  HENT:  Head: Normocephalic and atraumatic.  Eyes: Conjunctivae are normal.  Cardiovascular: Normal rate, regular rhythm, normal heart sounds and intact distal pulses.  Pulmonary/Chest: Effort normal and breath sounds normal.  Abdominal: Soft. Bowel sounds are normal.  She exhibits distension (obese).  Musculoskeletal:        General: Normal range of motion.     Cervical back: Normal range of motion and neck supple.  Neurological: She is alert and oriented to person, place, and time. She has normal reflexes.  Skin: Skin is warm and dry. Rash (fungal rash under breasts) noted.  Psychiatric: She has a normal mood and affect. Her behavior is normal. Judgment and thought content normal.  Nursing note and vitals reviewed.   BP 140/64   Pulse 60   Temp 98.1 F (36.7 C) (Oral)   Ht '5\' 3"'  (1.6 m)   Wt 243 lb (110.2 kg)   SpO2 97%   BMI 43.05 kg/m  Wt Readings from Last 3 Encounters:  02/17/19 243 lb (110.2 kg)  11/04/18 240 lb 9.6 oz (109.1 kg)  08/01/18 240 lb (108.9 kg)     Health Maintenance Due  Topic Date Due  . PAP SMEAR-Modifier  04/21/1980  . COLONOSCOPY  04/21/2009    There are no preventive care reminders to display for this patient.  Lab Results  Component Value Date   TSH 1.501 02/27/2017   Lab Results  Component Value Date   WBC 7.7 01/20/2018   HGB 13.2 01/20/2018   HCT 44.1 01/20/2018   MCV 86.6 01/20/2018   PLT 280 01/20/2018   Lab Results  Component Value Date   NA 141 01/20/2018   K 3.4 (L) 01/20/2018   CO2 25 01/20/2018   GLUCOSE 189 (H) 01/20/2018   BUN <5 (L) 01/20/2018   CREATININE 0.75 01/20/2018   BILITOT 0.2 (L) 03/18/2017   ALKPHOS 72 03/18/2017   AST 26 03/18/2017   ALT 18 03/18/2017   PROT 7.4 03/18/2017   ALBUMIN 3.5 03/18/2017   CALCIUM 8.9 01/20/2018   ANIONGAP 11 01/20/2018   Lab Results  Component Value Date   CHOL 204 (H) 11/01/2016   Lab Results  Component Value Date   HDL 64 11/01/2016   Lab Results  Component Value Date   LDLCALC 87 11/01/2016   Lab Results  Component Value Date   TRIG 266 (H) 11/01/2016   Lab Results  Component Value Date   CHOLHDL  3.2 11/01/2016   Lab Results  Component Value Date   HGBA1C 6.0 (A) 02/17/2019      Assessment & Plan:   1.  Essential hypertension The current medical regimen is effective; blood pressure is stable at 140/64 today; continue present plan and medications as prescribed. He will continue to take medications as prescribed, to decrease high sodium intake, excessive alcohol intake, increase potassium intake, smoking cessation, and increase physical activity of at least 30 minutes of cardio activity daily. He will continue to follow Heart Healthy or DASH diet.  2. Prediabetes  3. Generalized anxiety disorder Stable today.   4. Hot flashes Stable. Not worsening.   5. Fungal infection  6. Colon cancer screening  7. Abnormal urinalysis Results are pending.   8. Healthcare maintenance - POCT HgB A1C - Urinalysis Dipstick - Glucose (CBG) - Urine Culture - sulfamethoxazole-trimethoprim (BACTRIM DS) 800-160 MG tablet; Take 1 tablet by mouth 2 (two) times daily for 7 days.  Dispense: 14 tablet; Refill: 0 - CBC with Differential - Comp Met (CMET) - Lipid Panel - TSH - Vitamin D, 25-hydroxy - Vitamin B12  9. Follow up She will follow up in   Meds ordered this encounter  Medications  . sulfamethoxazole-trimethoprim (BACTRIM DS) 800-160 MG tablet    Sig: Take 1 tablet by mouth 2 (two) times daily for 7 days.    Dispense:  14 tablet    Refill:  0   Orders Placed This Encounter  Procedures  . Urine Culture  . CBC with Differential  . Comp Met (CMET)  . Lipid Panel  . TSH  . Vitamin D, 25-hydroxy  . Vitamin B12  . POCT HgB A1C  . Urinalysis Dipstick  . Glucose (CBG)    Referral Orders  No referral(s) requested today    Kathe Becton,  MSN, FNP-BC [address], [place] [address] [phone]   Problem List Items Addressed This Visit      Cardiovascular and Mediastinum   Essential hypertension - Primary   Hot flashes     Other   Generalized anxiety disorder     Other Visit Diagnoses    Prediabetes       Acute cystitis without hematuria       Colon cancer screening       Abnormal urinalysis       Healthcare maintenance       Relevant Medications   sulfamethoxazole-trimethoprim (BACTRIM DS) 800-160 MG tablet   Other Relevant Orders   POCT HgB A1C (Completed)   Urinalysis Dipstick (Completed)   Glucose (CBG) (Completed)   Urine Culture   CBC with Differential   Comp Met (CMET)   Lipid Panel   TSH   Vitamin D, 25-hydroxy   Vitamin B12   Follow up          Meds ordered this encounter  Medications  . sulfamethoxazole-trimethoprim (BACTRIM DS) 800-160 MG tablet    Sig: Take 1 tablet by mouth 2 (two) times daily for 7 days.    Dispense:  14 tablet    Refill:  0    Follow-up: No follow-ups on file.    Azzie Glatter, FNP

## 2019-02-18 DIAGNOSIS — N3000 Acute cystitis without hematuria: Secondary | ICD-10-CM | POA: Insufficient documentation

## 2019-02-18 DIAGNOSIS — B369 Superficial mycosis, unspecified: Secondary | ICD-10-CM | POA: Insufficient documentation

## 2019-02-18 DIAGNOSIS — R7303 Prediabetes: Secondary | ICD-10-CM | POA: Insufficient documentation

## 2019-02-18 LAB — CBC WITH DIFFERENTIAL/PLATELET
Basophils Absolute: 0 10*3/uL (ref 0.0–0.2)
Basos: 1 %
EOS (ABSOLUTE): 0.1 10*3/uL (ref 0.0–0.4)
Eos: 2 %
Hematocrit: 44.6 % (ref 34.0–46.6)
Hemoglobin: 14.2 g/dL (ref 11.1–15.9)
Immature Grans (Abs): 0 10*3/uL (ref 0.0–0.1)
Immature Granulocytes: 0 %
Lymphocytes Absolute: 2.4 10*3/uL (ref 0.7–3.1)
Lymphs: 29 %
MCH: 26 pg — ABNORMAL LOW (ref 26.6–33.0)
MCHC: 31.8 g/dL (ref 31.5–35.7)
MCV: 82 fL (ref 79–97)
Monocytes Absolute: 0.5 10*3/uL (ref 0.1–0.9)
Monocytes: 6 %
Neutrophils Absolute: 5.1 10*3/uL (ref 1.4–7.0)
Neutrophils: 62 %
Platelets: 323 10*3/uL (ref 150–450)
RBC: 5.46 x10E6/uL — ABNORMAL HIGH (ref 3.77–5.28)
RDW: 14.2 % (ref 11.7–15.4)
WBC: 8.1 10*3/uL (ref 3.4–10.8)

## 2019-02-18 LAB — COMPREHENSIVE METABOLIC PANEL
ALT: 15 IU/L (ref 0–32)
AST: 16 IU/L (ref 0–40)
Albumin/Globulin Ratio: 1.4 (ref 1.2–2.2)
Albumin: 4.5 g/dL (ref 3.8–4.9)
Alkaline Phosphatase: 79 IU/L (ref 39–117)
BUN/Creatinine Ratio: 13 (ref 9–23)
BUN: 13 mg/dL (ref 6–24)
Bilirubin Total: 0.5 mg/dL (ref 0.0–1.2)
CO2: 23 mmol/L (ref 20–29)
Calcium: 9.9 mg/dL (ref 8.7–10.2)
Chloride: 100 mmol/L (ref 96–106)
Creatinine, Ser: 0.98 mg/dL (ref 0.57–1.00)
GFR calc Af Amer: 73 mL/min/{1.73_m2} (ref >59)
GFR calc non Af Amer: 63 mL/min/{1.73_m2} (ref >59)
Globulin, Total: 3.3 g/dL (ref 1.5–4.5)
Glucose: 82 mg/dL (ref 65–99)
Potassium: 4.4 mmol/L (ref 3.5–5.2)
Sodium: 138 mmol/L (ref 134–144)
Total Protein: 7.8 g/dL (ref 6.0–8.5)

## 2019-02-18 LAB — LIPID PANEL
Chol/HDL Ratio: 2.8 ratio (ref 0.0–4.4)
Cholesterol, Total: 259 mg/dL — ABNORMAL HIGH (ref 100–199)
HDL: 94 mg/dL (ref >39)
LDL Chol Calc (NIH): 136 mg/dL — ABNORMAL HIGH (ref 0–99)
Triglycerides: 170 mg/dL — ABNORMAL HIGH (ref 0–149)
VLDL Cholesterol Cal: 29 mg/dL (ref 5–40)

## 2019-02-18 LAB — TSH: TSH: 1.69 u[IU]/mL (ref 0.450–4.500)

## 2019-02-18 LAB — VITAMIN D 25 HYDROXY (VIT D DEFICIENCY, FRACTURES): Vit D, 25-Hydroxy: 23.4 ng/mL — ABNORMAL LOW (ref 30.0–100.0)

## 2019-02-18 LAB — VITAMIN B12: Vitamin B-12: 650 pg/mL (ref 232–1245)

## 2019-02-19 LAB — URINE CULTURE

## 2019-02-23 ENCOUNTER — Other Ambulatory Visit: Payer: Self-pay | Admitting: Family Medicine

## 2019-02-23 ENCOUNTER — Encounter: Payer: Self-pay | Admitting: Family Medicine

## 2019-02-23 DIAGNOSIS — E782 Mixed hyperlipidemia: Secondary | ICD-10-CM

## 2019-02-23 MED ORDER — ATORVASTATIN CALCIUM 10 MG PO TABS: 10.0000 mg | ORAL_TABLET | Freq: Every day | ORAL | 6 refills | Status: DC

## 2019-04-10 ENCOUNTER — Telehealth: Payer: Self-pay | Admitting: Family Medicine

## 2019-04-10 NOTE — Telephone Encounter (Signed)
Kendi c/o a milky white discharge with frequent voids. No other symptoms.   Please advise.

## 2019-04-12 ENCOUNTER — Other Ambulatory Visit: Payer: Self-pay | Admitting: Family Medicine

## 2019-04-12 DIAGNOSIS — B9689 Other specified bacterial agents as the cause of diseases classified elsewhere: Secondary | ICD-10-CM

## 2019-04-12 MED ORDER — METRONIDAZOLE 500 MG PO TABS: 500.0000 mg | ORAL_TABLET | Freq: Two times a day (BID) | ORAL | 0 refills | Status: AC

## 2019-04-27 ENCOUNTER — Other Ambulatory Visit: Payer: Self-pay | Admitting: Family Medicine

## 2019-04-27 DIAGNOSIS — E7800 Pure hypercholesterolemia, unspecified: Secondary | ICD-10-CM

## 2019-06-06 DIAGNOSIS — N3941 Urge incontinence: Secondary | ICD-10-CM

## 2019-06-06 HISTORY — DX: Urge incontinence: N39.41

## 2019-06-23 ENCOUNTER — Other Ambulatory Visit: Payer: Self-pay | Admitting: Family Medicine

## 2019-06-23 DIAGNOSIS — R42 Dizziness and giddiness: Secondary | ICD-10-CM

## 2019-07-10 ENCOUNTER — Encounter: Payer: Self-pay | Admitting: Family Medicine

## 2019-07-22 ENCOUNTER — Other Ambulatory Visit: Payer: Self-pay | Admitting: Family Medicine

## 2019-07-22 DIAGNOSIS — R3915 Urgency of urination: Secondary | ICD-10-CM

## 2019-07-22 MED ORDER — OXYBUTYNIN CHLORIDE 5 MG PO TABS: 5.0000 mg | ORAL_TABLET | Freq: Every day | ORAL | 3 refills | Status: DC

## 2019-08-18 ENCOUNTER — Ambulatory Visit (INDEPENDENT_AMBULATORY_CARE_PROVIDER_SITE_OTHER): Payer: Medicaid Other | Admitting: Family Medicine

## 2019-08-18 ENCOUNTER — Encounter: Payer: Self-pay | Admitting: Family Medicine

## 2019-08-18 ENCOUNTER — Other Ambulatory Visit: Payer: Self-pay

## 2019-08-18 VITALS — BP 133/75 | HR 60 | Temp 97.3°F | Resp 16 | Ht 64.0 in | Wt 249.4 lb

## 2019-08-18 DIAGNOSIS — R7303 Prediabetes: Secondary | ICD-10-CM

## 2019-08-18 DIAGNOSIS — I10 Essential (primary) hypertension: Secondary | ICD-10-CM

## 2019-08-18 DIAGNOSIS — L299 Pruritus, unspecified: Secondary | ICD-10-CM | POA: Diagnosis not present

## 2019-08-18 DIAGNOSIS — Z09 Encounter for follow-up examination after completed treatment for conditions other than malignant neoplasm: Secondary | ICD-10-CM

## 2019-08-18 DIAGNOSIS — R21 Rash and other nonspecific skin eruption: Secondary | ICD-10-CM

## 2019-08-18 DIAGNOSIS — R232 Flushing: Secondary | ICD-10-CM

## 2019-08-18 DIAGNOSIS — B369 Superficial mycosis, unspecified: Secondary | ICD-10-CM

## 2019-08-18 DIAGNOSIS — L089 Local infection of the skin and subcutaneous tissue, unspecified: Secondary | ICD-10-CM | POA: Diagnosis not present

## 2019-08-18 DIAGNOSIS — R0602 Shortness of breath: Secondary | ICD-10-CM

## 2019-08-18 DIAGNOSIS — R8290 Unspecified abnormal findings in urine: Secondary | ICD-10-CM

## 2019-08-18 DIAGNOSIS — T7840XA Allergy, unspecified, initial encounter: Secondary | ICD-10-CM

## 2019-08-18 LAB — POCT URINALYSIS DIPSTICK
Bilirubin, UA: NEGATIVE
Glucose, UA: NEGATIVE
Ketones, UA: NEGATIVE
Leukocytes, UA: NEGATIVE
Nitrite, UA: NEGATIVE
Protein, UA: POSITIVE — AB
Spec Grav, UA: 1.015 (ref 1.010–1.025)
Urobilinogen, UA: 0.2 E.U./dL
pH, UA: 7 (ref 5.0–8.0)

## 2019-08-18 MED ORDER — LEVOCETIRIZINE DIHYDROCHLORIDE 5 MG PO TABS: 5.0000 mg | ORAL_TABLET | Freq: Every evening | ORAL | 3 refills | Status: AC

## 2019-08-18 MED ORDER — NYSTATIN 100000 UNIT/GM EX POWD: CUTANEOUS | 6 refills | Status: DC

## 2019-08-18 MED ORDER — HYDROXYZINE HCL 25 MG PO TABS: 25.0000 mg | ORAL_TABLET | Freq: Three times a day (TID) | ORAL | 3 refills | Status: AC | PRN

## 2019-08-18 MED ORDER — DOXYCYCLINE HYCLATE 100 MG PO TABS: 100.0000 mg | ORAL_TABLET | Freq: Two times a day (BID) | ORAL | 0 refills | Status: DC

## 2019-08-18 MED ORDER — ALBUTEROL SULFATE HFA 108 (90 BASE) MCG/ACT IN AERS: 2.0000 | INHALATION_SPRAY | Freq: Four times a day (QID) | RESPIRATORY_TRACT | 11 refills | Status: AC | PRN

## 2019-08-18 NOTE — Patient Instructions (Addendum)
Skin Yeast Infection  A skin yeast infection is a condition in which there is an overgrowth of yeast (candida) that normally lives on the skin. This condition usually occurs in areas of the skin that are constantly warm and moist, such as the armpits or the groin. What are the causes? This condition is caused by a change in the normal balance of the yeast and bacteria that live on the skin. What increases the risk? You are more likely to develop this condition if you:  Are obese.  Are pregnant.  Take birth control pills.  Have diabetes.  Take antibiotic medicines.  Take steroid medicines.  Are malnourished.  Have a weak body defense system (immune system).  Are 36 years of age or older.  Wear tight clothing. What are the signs or symptoms? The most common symptom of this condition is itchiness in the affected area. Other symptoms include:  Red, swollen area of the skin.  Bumps on the skin. How is this diagnosed?  This condition is diagnosed with a medical history and physical exam.  Your health care provider may check for yeast by taking light scrapings of the skin to be viewed under a microscope. How is this treated? This condition is treated with medicine. Medicines may be prescribed or be available over the counter. The medicines may be:  Taken by mouth (orally).  Applied as a cream or powder to your skin. Follow these instructions at home:   Take or apply over-the-counter and prescription medicines only as told by your health care provider.  Maintain a healthy weight. If you need help losing weight, talk with your health care provider.  Keep your skin clean and dry.  If you have diabetes, keep your blood sugar under control.  Keep all follow-up visits as told by your health care provider. This is important. Contact a health care provider if:  Your symptoms go away and then return.  Your symptoms do not get better with treatment.  Your symptoms get  worse.  Your rash spreads.  You have a fever or chills.  You have new symptoms.  You have new warmth or redness of your skin. Summary  A skin yeast infection is a condition in which there is an overgrowth of yeast (candida) that normally lives on the skin. This condition is caused by a change in the normal balance of the yeast and bacteria that live on the skin.  Take or apply over-the-counter and prescription medicines only as told by your health care provider.  Keep your skin clean and dry.  Contact a health care provider if your symptoms do not get better with treatment. This information is not intended to replace advice given to you by your health care provider. Make sure you discuss any questions you have with your health care provider. Document Revised: 06/11/2017 Document Reviewed: 06/11/2017 Elsevier Patient Education  2020 [employer] Doxycycline delayed-release capsules What is this medicine? DOXYCYCLINE (dox i SYE kleen) is a tetracycline antibiotic. It is used to treat certain kinds of bacterial infections, Lyme disease, and malaria. It will not work for colds, flu, or other viral infections. This medicine may be used for other purposes; ask your health care provider or pharmacist if you have questions. COMMON BRAND NAME(S): Oracea What should I tell my health care provider before I take this medicine? They need to know if you have any of these conditions:  bowel disease like colitis  liver disease  long exposure to sunlight like working outdoors  an unusual or allergic reaction to doxycycline, tetracycline antibiotics, other medicines, foods, dyes, or preservatives  pregnant or trying to get pregnant  breast-feeding How should I use this medicine? Take this medicine by mouth with a full glass of water. Follow the directions on the prescription label. Do not crush or chew. The capsules may be opened and the pellets sprinkled on applesauce. Swallow the pellets  whole without chewing. Follow with an 8 ounce glass of water to help you swallow all the pellets. Do not prepare a dose and store for later use. The applesauce mixture should be taken immediately after you prepare it. It is best to take this medicine without other food, but if it upsets your stomach take it with food. Take your medicine at regular intervals. Do not take your medicine more often than directed. Take all of your medicine as directed even if you think your are better. Do not skip doses or stop your medicine early. Talk to your pediatrician regarding the use of this medicine in children. While this drug may be prescribed for selected conditions, precautions do apply. Overdosage: If you think you have taken too much of this medicine contact a poison control center or emergency room at once. NOTE: This medicine is only for you. Do not share this medicine with others. What if I miss a dose? If you miss a dose, take it as soon as you can. If it is almost time for your next dose, take only that dose. Do not take double or extra doses. What may interact with this medicine?  antacids  barbiturates  birth control pills  bismuth subsalicylate  carbamazepine  methoxyflurane  other antibiotics  phenytoin  vitamins that contain iron  warfarin This list may not describe all possible interactions. Give your health care provider a list of all the medicines, herbs, non-prescription drugs, or dietary supplements you use. Also tell them if you smoke, drink alcohol, or use illegal drugs. Some items may interact with your medicine. What should I watch for while using this medicine? Tell your doctor or health care professional if your symptoms do not improve. Do not treat diarrhea with over the counter products. Contact your doctor if you have diarrhea that lasts more than 2 days or if it is severe and watery. Do not take this medicine just before going to bed. It may not dissolve properly when  you lay down and can cause pain in your throat. Drink plenty of fluids while taking this medicine to also help reduce irritation in your throat. This medicine can make you more sensitive to the sun. Keep out of the sun. If you cannot avoid being in the sun, wear protective clothing and use sunscreen. Do not use sun lamps or tanning beds/booths. If you are being treated for a sexually transmitted infection, avoid sexual contact until you have finished your treatment. Your sexual partner may also need treatment. Avoid antacids, aluminum, calcium, magnesium, and iron products for 4 hours before and 2 hours after taking a dose of this medicine. Birth control pills may not work properly while you are taking this medicine. Talk to your doctor about using an extra method of birth control. If you are using this medicine to prevent malaria, you should still protect yourself from contact with mosquitos. Stay in screened-in areas, use mosquito nets, keep your body covered, and use an insect repellent. What side effects may I notice from receiving this medicine? Side effects that you should report to your doctor or  health care professional as soon as possible:  allergic reactions like skin rash, itching or hives, swelling of the face, lips, or tongue  difficulty breathing  fever  itching in the rectal or genital area  pain on swallowing  rash, fever, and swollen lymph nodes  redness, blistering, peeling or loosening of the skin, including inside the mouth  severe stomach pain or cramps  unusual bleeding or bruising  unusually weak or tired  yellowing of the eyes or skin Side effects that usually do not require medical attention (report to your doctor or health care professional if they continue or are bothersome):  diarrhea  loss of appetite  nausea, vomiting This list may not describe all possible side effects. Call your doctor for medical advice about side effects. You may report side  effects to FDA at 1-800-FDA-1088. Where should I keep my medicine? Keep out of the reach of children. Store at room temperature, below 25 degrees C (77 degrees F). Protect from light. Keep container tightly closed. Throw away any unused medicine after the expiration date. Taking this medicine after the expiration date can make you seriously ill. NOTE: This sheet is a summary. It may not cover all possible information. If you have questions about this medicine, talk to your doctor, pharmacist, or health care provider.  2020 Elsevier/[employer] Standard (2018-04-24 13:23:57) Levocetirizine Oral Tablets What is this medicine? LEVOCETIRIZINE (LEE voe se TIR i zeen) is an antihistamine. This medicine is used to treat or prevent symptoms of allergies. It is also used to help reduce itchy skin rash and hives. This medicine may be used for other purposes; ask your health care provider or pharmacist if you have questions. COMMON BRAND NAME(S): Xyzal, Xyzal Allergy 24 Hour What should I tell my health care provider before I take this medicine? They need to know if you have any of these conditions:  kidney disease  an unusual or allergic reaction to levocetirizine, cetirizine, hydroxyzine, other medicines, foods, dyes, or preservatives  pregnant or trying to get pregnant  breast-feeding How should I use this medicine? Take this medicine by mouth with a glass of water. Take it at night. The tablet may be split in half. Do not chew the tablets. Follow the directions on the prescription label. You can take it with or without food. Do not take more medicine than directed. You may need to take this medicine for several days before your symptoms improve. Talk to your pediatrician regarding the use of this medicine in children. While this drug may be prescribed for children as young as 42 years old for selected conditions, precautions do apply. Overdosage: If you think you have taken too much of this medicine  contact a poison control center or emergency room at once. NOTE: This medicine is only for you. Do not share this medicine with others. What if I miss a dose? If you miss a dose, take it as soon as you can. If it is almost time for your next dose, take only that dose. Do not take double or extra doses. What may interact with this medicine?  alcohol  MAOIs like Carbex, Eldepryl, Marplan, Nardil, and Parnate  medicines that cause drowsiness or sleep  other medicines for colds or allergies  ritonavir  theophylline This list may not describe all possible interactions. Give your health care provider a list of all the medicines, herbs, non-prescription drugs, or dietary supplements you use. Also tell them if you smoke, drink alcohol, or use illegal drugs. Some items  may interact with your medicine. What should I watch for while using this medicine? Visit your doctor or health care professional for regular checks on your health. Tell your doctor or healthcare professional if your symptoms do not start to get better or if they get worse. You may get drowsy or dizzy. Do not drive, use machinery, or do anything that needs mental alertness until you know how this medicine affects you. Do not stand or sit up quickly, especially if you are an older patient. This reduces the risk of dizzy or fainting spells. Alcohol may interfere with the effect of this medicine. Avoid alcoholic drinks. Your mouth may get dry. Chewing sugarless gum or sucking hard candy, and drinking plenty of water may help. Contact your doctor if the problem does not go away or is severe. What side effects may I notice from receiving this medicine? Side effects that you should report to your doctor or health care professional as soon as possible:  allergic reactions like skin rash, itching or hives, swelling of the face, lips, or tongue  changes in vision or hearing  fever  trouble passing urine or change in the amount of  urine Side effects that usually do not require medical attention (report to your doctor or health care professional if they continue or are bothersome):  cough  dizziness  drowsiness or tiredness  dry mouth  muscle aches This list may not describe all possible side effects. Call your doctor for medical advice about side effects. You may report side effects to FDA at 1-800-FDA-1088. Where should I keep my medicine? Keep out of the reach of children. Store at room temperature between 15 and 30 degrees C (59 and 86 degrees F). Throw away any unused medicine after the expiration date. NOTE: This sheet is a summary. It may not cover all possible information. If you have questions about this medicine, talk to your doctor, pharmacist, or health care provider.  2020 Elsevier/Gold Standard (2007-10-15 11:17:47)

## 2019-08-18 NOTE — Progress Notes (Signed)
[facility]   Established Patient Office Visit  Subjective:  Patient ID: Kathleen Herrera, female    DOB: 11/19/1960  Age: 59 y.o. MRN: [id number]  CC:  Chief Complaint  Patient presents with  . Follow-up    Pt states she is here for a rash under her breast  and top of stomach by her breast.Pt states it has been happen all summer    HPI Kathleen Herrera is a 62 a 59 year old female who presents for Follow Up today.   Patient Active Problem List   Diagnosis Date Noted  . Acute cystitis without hematuria 02/18/2019  . Prediabetes 02/18/2019  . Fungal skin infection 02/18/2019  . Hot flashes 11/05/2018  . Dizziness 11/05/2018  . Skin rash 04/20/2018  . Itching 04/20/2018  . Upper airway cough syndrome 06/21/2017  . Chronic obstructive pulmonary disease (HCC) 05/26/2017  . History of pneumonia 05/26/2017  . Generalized anxiety disorder 05/26/2017  . Bipolar 1 disorder, depressed, severe (HCC) 02/25/2017  . 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 Episodic recurrent vertigo   . Dermatitis   . Asthma   . CAP (community acquired pneumonia) 09/19/2015  . Dyspnea 09/19/2015  . Acute respiratory failure with hypoxia (HCC) 09/19/2015  . Abnormality of gait 06/29/2014  . Essential hypertension 06/29/2014  . Hyperlipidemia 06/29/2014  . Metabolic syndrome 06/29/2014  . Neuropathic pain 06/29/2014  . Gastroesophageal reflux disease without esophagitis 06/29/2014  . Tachycardia 06/29/2014  . At risk for osteopenia 06/29/2014  . Excessive daytime sleepiness 06/29/2014  . Fibromyalgia   . Abnormal LFTs 03/06/2013  . Morbid obesity due to excess calories (HCC) 12/18/2012  . Fatty infiltration of liver 07/15/2012  . Gonalgia 07/15/2012  . LBP (low back pain) 07/15/2012  . Arthritis, degenerative 06/12/2012  . Arthritis 05/26/2012  . Bipolar 2 disorder (HCC) 04/17/2012  . Clinical depression 04/17/2012     Past  Medical History:  Diagnosis Date  . Arthritis   . Asthma   . 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 Bipolar disorder (HCC)   . Bronchitis   . Depression   . Dizziness 09/2018  . Fibromyalgia Y-2  . Fibromyalgia unknown  . Hyperlipemia   . Hyperlipidemia 02/2019  . Hypertension   . Post traumatic stress disorder   . Sinus congestion   . Urge incontinence 06/2019   Current Status: Since her last office visit, she has c/o of skin infection under breasts. She denies visual changes, chest pain, cough, shortness of breath, heart palpitations, and falls. She has occasional headaches and dizziness with position changes. Denies severe headaches, confusion, seizures, double vision, and blurred vision, nausea and vomiting. She denies fevers, chills, fatigue, recent infections, weight loss, and night sweats. Denies GI problems such as diarrhea, and constipation. She has no reports of blood in stools, dysuria and hematuria. No depression or anxiety reported today. She denies suicidal ideations, homicidal ideations, or auditory hallucinations. She continues to follow up with Psychiatry as needed. She is taking all medications as prescribed. She denies pain today.   Past Surgical History:  Procedure Laterality Date  . ABDOMINAL HYSTERECTOMY      Family History  Problem Relation Age of Onset  . Dementia Mother   . Prostate cancer Father   . Dementia Father   . Prostate cancer Other   . CAD Other     Social History   Socioeconomic History  . Marital status: Single    Spouse name: Not on file  . Number  of children: Not on file  . Years of education: Not on file  . Highest education level: Not on file  Occupational History  . Not on file  Tobacco Use  . Smoking status: Current Every Day Smoker    Packs/day: 0.25    Years: 37.00    Pack years: 9.25    Types: Cigarettes  . Smokeless tobacco: Never Used  Vaping Use  . Vaping Use: Never used  Substance and Sexual Activity  . Alcohol use: Yes     Comment: occasionally  . Drug use: No    Comment: recovering crack addict  . Sexual activity: Not Currently  Other Topics Concern  . Not on file  Social History Narrative  . Not on file   Social Determinants of Health   Financial Resource Strain:   . Difficulty of Paying Living Expenses:   Food Insecurity:   . Worried About Programme researcher, broadcasting/film/videounning Out of Food in the Last Year:   . Baristaan Out of Food in the Last Year:   Transportation Needs:   . Freight forwarderLack of Transportation (Medical):   Marland Kitchen. Lack of Transportation (Non-Medical):   Physical Activity:   . Days of Exercise per Week:   . Minutes of Exercise per Session:   Stress:   . Feeling of Stress :   Social Connections:   . Frequency of Communication with Friends and Family:   . Frequency of Social Gatherings with Friends and Family:   . Attends Religious Services:   . Active Member of Clubs or Organizations:   . Attends BankerClub or Organization Meetings:   Marland Kitchen. Marital Status:   Intimate Partner Violence:   . Fear of Current or Ex-Partner:   . Emotionally Abused:   Marland Kitchen. Physically Abused:   . Sexually Abused:     Outpatient Medications Prior to Visit  Medication Sig Dispense Refill  . amLODipine (NORVASC) 10 MG tablet TAKE ONE TABLET BY MOUTH DAILY 90 tablet 3  . atorvastatin (LIPITOR) 10 MG tablet Take 1 tablet (10 mg total) by mouth daily. 30 tablet 6  . busPIRone (BUSPAR) 15 MG tablet Take 15 mg by mouth 3 (three) times daily.    . clotrimazole-betamethasone (LOTRISONE) cream APPLY ONE APPLICATION TOPICALLY TWICE A DAY 90 g 1  . furosemide (LASIX) 20 MG tablet TAKE 1 TABLET (20 MG TOTAL) BY MOUTH DAILY. 30 tablet 3  . lamoTRIgine (LAMICTAL) 25 MG tablet Take 1 tablet (25 mg total) by mouth 2 (two) times daily. For mood control 60 tablet 0  . lithium carbonate 300 MG capsule Take 1 capsule (300 mg total) by mouth every morning. For mood control (Patient taking differently: Take 300-600 mg by mouth See admin instructions. Take 300 mg by mouth in the  morning and 600 mg by mouth at bedtime) 30 capsule 0  . meclizine (ANTIVERT) 25 MG tablet TAKE ONE TABLET BY MOUTH THREE TIMES A DAY AS NEEDED FOR DIZZINESS 30 tablet 6  . metoprolol succinate (TOPROL-XL) 50 MG 24 hr tablet TAKE TWO TABLETS (100 MG TOTAL) BY MOUTH DAILY. TAKE WITH OR IMMEDIATELY FOLLOWING A MEAL. 180 tablet 11  . Omega-3 Fatty Acids (FISH OIL) 1000 MG CAPS Take 1 capsule (1,000 mg total) by mouth daily. 30 capsule 6  . oxybutynin (DITROPAN) 5 MG tablet Take 1 tablet (5 mg total) by mouth daily. 90 tablet 3  . pantoprazole (PROTONIX) 40 MG tablet TAKE ONE TABLET BY MOUTH TWICE A DAY BEFORE MEALS (IN THE MORNING AND IN THE EVENING) 60 tablet 3  .  PARoxetine (PAXIL) 30 MG tablet Take 30 mg by mouth every morning.    . hydrOXYzine (ATARAX/VISTARIL) 25 MG tablet Take 1 tablet (25 mg total) by mouth 3 (three) times daily as needed for itching or anxiety. (Patient taking differently: Take 25 mg by mouth daily. ) 30 tablet 1  . nystatin (MYCOSTATIN/NYSTOP) powder APPLY TWO SPRINKLES OF POWDER TO THE AFFECTED INNER THIGHS TO PREVENT ITCHING AND FUNGAL RASH 30 g 6  . PROAIR HFA 108 (90 Base) MCG/ACT inhaler INHALE TWO PUFFS INTO THE LUNGS EVERY 6 HOURS AS NEEDED FOR WHEEZING OR SHORTNESS OF BREATH. (Patient taking differently: Inhale 2 puffs into the lungs every 6 (six) hours as needed for wheezing or shortness of breath. ) 8.5 Inhaler 3  . potassium chloride SA (K-DUR,KLOR-CON) 20 MEQ tablet Take 1 tablet (20 mEq total) by mouth daily. (Patient not taking: Reported on 08/01/2018) 3 tablet 0  . promethazine-dextromethorphan (PROMETHAZINE-DM) 6.25-15 MG/5ML syrup Take 5-10 mLs by mouth 4 (four) times daily as needed for cough. (Patient not taking: Reported on 11/04/2018) 240 mL 0  . simvastatin (ZOCOR) 20 MG tablet TAKE ONE TABLET BY MOUTH AT BEDTIME (Patient not taking: Reported on 08/18/2019) 30 tablet 1  . SYMBICORT 80-4.5 MCG/ACT inhaler INHALE TWO PUFFS INTO THE LUNGS TWO TIMES DAILY. (Patient  not taking: Reported on 08/18/2019) 1 Inhaler 1  . gabapentin (NEURONTIN) 300 MG capsule Take 2 capsules 2 times daily. (Patient not taking: Reported on 08/18/2019) 120 capsule 3  . GNP ASPIRIN 325 MG tablet TAKE 1 TABLET (325 MG TOTAL) BY MOUTH DAILY. (Patient not taking: No sig reported) 30 tablet 6  . Guaifenesin 1200 MG TB12 Take 1 tablet (1,200 mg total) by mouth 2 (two) times daily. (Patient not taking: Reported on 11/04/2018) 20 each 0  . hydrocortisone cream 0.5 % Apply 1 application topically 2 (two) times daily. (Patient not taking: Reported on 08/18/2019) 30 g 2   No facility-administered medications prior to visit.    No Known Allergies  ROS Review of Systems  Constitutional: Negative.   HENT: Negative.   Eyes: Negative.   Respiratory: Negative.   Cardiovascular: Negative.   Gastrointestinal: Negative.   Endocrine: Negative.   Genitourinary: Negative.   Musculoskeletal: Negative.   Skin: Positive for rash (under breasts).  Allergic/Immunologic: Negative.   Neurological: Positive for dizziness (occasional) and headaches (occasional ).  Hematological: Negative.   Psychiatric/Behavioral: Negative.       Objective:    Physical Exam Vitals and nursing note reviewed.  Constitutional:      Appearance: Normal appearance. She is obese.  HENT:     Head: Normocephalic and atraumatic.     Nose: Nose normal.     Mouth/Throat:     Mouth: Mucous membranes are moist.     Pharynx: Oropharynx is clear.  Cardiovascular:     Rate and Rhythm: Normal rate and regular rhythm.     Pulses: Normal pulses.     Heart sounds: Normal heart sounds.  Pulmonary:     Effort: Pulmonary effort is normal.  Abdominal:     General: Bowel sounds are normal. There is distension (distended. ).     Palpations: Abdomen is soft.  Musculoskeletal:        General: Normal range of motion.     Cervical back: Normal range of motion and neck supple.  Skin:    General: Skin is warm and dry.    Neurological:     General: No focal deficit present.  Mental Status: She is alert and oriented to person, place, and time.  Psychiatric:        Mood and Affect: Mood normal.        Behavior: Behavior normal.        Thought Content: Thought content normal.        Judgment: Judgment normal.     BP 133/75 (BP Location: Left Arm, Patient Position: Sitting, Cuff Size: Large)   Pulse 60   Temp (!) 97.3 F (36.3 C)   Resp 16   Ht 5\' 4"  (1.626 m)   Wt 249 lb 6.4 oz (113.1 kg)   SpO2 100%   BMI 42.81 kg/m  Wt Readings from Last 3 Encounters:  08/18/19 249 lb 6.4 oz (113.1 kg)  02/17/19 243 lb (110.2 kg)  11/04/18 240 lb 9.6 oz (109.1 kg)     Health Maintenance Due  Topic Date Due  . COVID-19 Vaccine (1) Never done  . PAP SMEAR-Modifier  Never done  . COLONOSCOPY  Never done    There are no preventive care reminders to display for this patient.  Lab Results  Component Value Date   TSH 1.690 02/17/2019   Lab Results  Component Value Date   WBC 8.1 02/17/2019   HGB 14.2 02/17/2019   HCT 44.6 02/17/2019   MCV 82 02/17/2019   PLT 323 02/17/2019   Lab Results  Component Value Date   NA 138 02/17/2019   K 4.4 02/17/2019   CO2 23 02/17/2019   GLUCOSE 82 02/17/2019   BUN 13 02/17/2019   CREATININE 0.98 02/17/2019   BILITOT 0.5 02/17/2019   ALKPHOS 79 02/17/2019   AST 16 02/17/2019   ALT 15 02/17/2019   PROT 7.8 02/17/2019   ALBUMIN 4.5 02/17/2019   CALCIUM 9.9 02/17/2019   ANIONGAP 11 01/20/2018   Lab Results  Component Value Date   CHOL 259 (H) 02/17/2019   Lab Results  Component Value Date   HDL 94 02/17/2019   Lab Results  Component Value Date   LDLCALC 136 (H) 02/17/2019   Lab Results  Component Value Date   TRIG 170 (H) 02/17/2019   Lab Results  Component Value Date   CHOLHDL 2.8 02/17/2019   Lab Results  Component Value Date   HGBA1C 6.0 (A) 02/17/2019    Assessment & Plan:   1. Skin infection Skin infection under breasts. We will  initiate antibiotic today.  - doxycycline (VIBRA-TABS) 100 MG tablet; Take 1 tablet (100 mg total) by mouth 2 (two) times daily.  Dispense: 20 tablet; Refill: 0  2. Generalized rash - hydrOXYzine (ATARAX/VISTARIL) 25 MG tablet; Take 1 tablet (25 mg total) by mouth 3 (three) times daily as needed for itching or anxiety.  Dispense: 270 tablet; Refill: 3  3. Pruritus - hydrOXYzine (ATARAX/VISTARIL) 25 MG tablet; Take 1 tablet (25 mg total) by mouth 3 (three) times daily as needed for itching or anxiety.  Dispense: 270 tablet; Refill: 3  4. Fungal skin infection - nystatin (MYCOSTATIN/NYSTOP) powder; Apply 2 sprinkles of powder the affected inner thighs and under breasts to prevent itching and fungal rash.  Dispense: 30 g; Refill: 6  5. Allergy, initial encounter - albuterol (PROAIR HFA) 108 (90 Base) MCG/ACT inhaler; Inhale 2 puffs into the lungs every 6 (six) hours as needed for wheezing or shortness of breath.  Dispense: 18 g; Refill: 11 - levocetirizine (XYZAL) 5 MG tablet; Take 1 tablet (5 mg total) by mouth every evening.  Dispense: 90 tablet; Refill: 3  6. Shortness of breath Stable. No signs or symptoms of respiratory distress noted or reported.   7. Hot flashes Stable.   8. Essential hypertension The current medical regimen is effective; blood pressure is stable at 133/75 today; continue present plan and medications as prescribed. She will continue to take medications as prescribed, to decrease high sodium intake, excessive alcohol intake, increase potassium intake, smoking cessation, and increase physical activity of at least 30 minutes of cardio activity daily. She will continue to follow Heart Healthy or DASH diet.  9. Prediabetes Hgb A1c stable at 6.0. monitor.   10. Abnormal urinalysis  11. Follow up She will follow up in 6 months.    Meds ordered this encounter  Medications  . doxycycline (VIBRA-TABS) 100 MG tablet    Sig: Take 1 tablet (100 mg total) by mouth 2 (two)  times daily.    Dispense:  20 tablet    Refill:  0  . hydrOXYzine (ATARAX/VISTARIL) 25 MG tablet    Sig: Take 1 tablet (25 mg total) by mouth 3 (three) times daily as needed for itching or anxiety.    Dispense:  270 tablet    Refill:  3  . nystatin (MYCOSTATIN/NYSTOP) powder    Sig: Apply 2 sprinkles of powder the affected inner thighs and under breasts to prevent itching and fungal rash.    Dispense:  30 g    Refill:  6  . albuterol (PROAIR HFA) 108 (90 Base) MCG/ACT inhaler    Sig: Inhale 2 puffs into the lungs every 6 (six) hours as needed for wheezing or shortness of breath.    Dispense:  18 g    Refill:  11  . levocetirizine (XYZAL) 5 MG tablet    Sig: Take 1 tablet (5 mg total) by mouth every evening.    Dispense:  90 tablet    Refill:  3    Orders Placed This Encounter  Procedures  . Urine Culture  . Urinalysis Dipstick    Referral Orders  No referral(s) requested today    Raliegh Ip,  MSN, FNP-BC [facility] [address] [place], [place] [address] [phone] fax  Problem List Items Addressed This Visit      Cardiovascular and Mediastinum   Essential hypertension   Relevant Orders   Urinalysis Dipstick (Completed)   Hot flashes     Musculoskeletal and Integument   Fungal skin infection   Relevant Medications   nystatin (MYCOSTATIN/NYSTOP) powder     Other   Dyspnea   Prediabetes    Other Visit Diagnoses    Skin infection    -  Primary   Relevant Medications   doxycycline (VIBRA-TABS) 100 MG tablet   nystatin (MYCOSTATIN/NYSTOP) powder   Generalized rash       Relevant Medications   hydrOXYzine (ATARAX/VISTARIL) 25 MG tablet   Pruritus       Relevant Medications   hydrOXYzine (ATARAX/VISTARIL) 25 MG tablet   Allergy, initial encounter       Relevant Medications   albuterol (PROAIR HFA) 108 (90 Base) MCG/ACT inhaler   levocetirizine  (XYZAL) 5 MG tablet   Abnormal urinalysis       Relevant Orders   Urine Culture (Completed)   Follow up          Meds ordered this encounter  Medications  . doxycycline (VIBRA-TABS) 100 MG tablet    Sig: Take 1 tablet (100 mg total) by mouth  2 (two) times daily.    Dispense:  20 tablet    Refill:  0  . hydrOXYzine (ATARAX/VISTARIL) 25 MG tablet    Sig: Take 1 tablet (25 mg total) by mouth 3 (three) times daily as needed for itching or anxiety.    Dispense:  270 tablet    Refill:  3  . nystatin (MYCOSTATIN/NYSTOP) powder    Sig: Apply 2 sprinkles of powder the affected inner thighs and under breasts to prevent itching and fungal rash.    Dispense:  30 g    Refill:  6  . albuterol (PROAIR HFA) 108 (90 Base) MCG/ACT inhaler    Sig: Inhale 2 puffs into the lungs every 6 (six) hours as needed for wheezing or shortness of breath.    Dispense:  18 g    Refill:  11  . levocetirizine (XYZAL) 5 MG tablet    Sig: Take 1 tablet (5 mg total) by mouth every evening.    Dispense:  90 tablet    Refill:  3    Follow-up: Return in about 6 months (around 02/18/2020).    Kallie Locks, FNP

## 2019-08-20 LAB — URINE CULTURE

## 2019-08-28 ENCOUNTER — Other Ambulatory Visit: Payer: Self-pay | Admitting: Family Medicine

## 2019-09-03 ENCOUNTER — Other Ambulatory Visit: Payer: Self-pay | Admitting: Nurse Practitioner

## 2019-09-03 ENCOUNTER — Telehealth: Payer: Self-pay | Admitting: Family Medicine

## 2019-09-03 DIAGNOSIS — L089 Local infection of the skin and subcutaneous tissue, unspecified: Secondary | ICD-10-CM

## 2019-09-03 MED ORDER — BUSPIRONE HCL 15 MG PO TABS: 15.0000 mg | ORAL_TABLET | Freq: Three times a day (TID) | ORAL | 0 refills | Status: AC

## 2019-09-03 MED ORDER — DOXYCYCLINE HYCLATE 100 MG PO TABS: 100.0000 mg | ORAL_TABLET | Freq: Two times a day (BID) | ORAL | 0 refills | Status: DC

## 2019-09-03 NOTE — Telephone Encounter (Signed)
I spoke with patient, she states no rash or breakdown. She also says she is still using nystatin powder.   She also is asking for a refill on Buspar. I advised her that this was not prescribed by [place]. She still wants to know if we can refill this. (I called [facility] and according to [facility] it was prescribed by Dr. Otelia Santee phone number [phone])

## 2019-09-03 NOTE — Telephone Encounter (Signed)
Called, no answer. Left a message to call back. Thanks!  

## 2019-09-03 NOTE — Telephone Encounter (Signed)
Can you see if she is still using the nystatin powder Did this help Does she have a rash or any skin breakdown or is just an itch? Thanks

## 2019-09-23 ENCOUNTER — Other Ambulatory Visit: Payer: Self-pay | Admitting: Family Medicine

## 2019-09-23 DIAGNOSIS — E782 Mixed hyperlipidemia: Secondary | ICD-10-CM

## 2019-09-23 DIAGNOSIS — I10 Essential (primary) hypertension: Secondary | ICD-10-CM

## 2019-10-06 ENCOUNTER — Encounter: Payer: Self-pay | Admitting: Family Medicine

## 2019-10-06 ENCOUNTER — Other Ambulatory Visit: Payer: Self-pay

## 2019-10-06 ENCOUNTER — Ambulatory Visit (INDEPENDENT_AMBULATORY_CARE_PROVIDER_SITE_OTHER): Payer: Medicaid Other | Admitting: Family Medicine

## 2019-10-06 VITALS — BP 143/81 | HR 55 | Temp 97.3°F | Resp 16 | Ht 64.0 in | Wt 245.0 lb

## 2019-10-06 DIAGNOSIS — L299 Pruritus, unspecified: Secondary | ICD-10-CM | POA: Diagnosis not present

## 2019-10-06 DIAGNOSIS — I10 Essential (primary) hypertension: Secondary | ICD-10-CM

## 2019-10-06 DIAGNOSIS — R21 Rash and other nonspecific skin eruption: Secondary | ICD-10-CM

## 2019-10-06 DIAGNOSIS — E782 Mixed hyperlipidemia: Secondary | ICD-10-CM

## 2019-10-06 DIAGNOSIS — R232 Flushing: Secondary | ICD-10-CM

## 2019-10-06 DIAGNOSIS — T7840XA Allergy, unspecified, initial encounter: Secondary | ICD-10-CM | POA: Diagnosis not present

## 2019-10-06 DIAGNOSIS — R0602 Shortness of breath: Secondary | ICD-10-CM

## 2019-10-06 DIAGNOSIS — Z09 Encounter for follow-up examination after completed treatment for conditions other than malignant neoplasm: Secondary | ICD-10-CM

## 2019-10-06 DIAGNOSIS — F314 Bipolar disorder, current episode depressed, severe, without psychotic features: Secondary | ICD-10-CM

## 2019-10-06 DIAGNOSIS — E7800 Pure hypercholesterolemia, unspecified: Secondary | ICD-10-CM

## 2019-10-06 MED ORDER — BUDESONIDE-FORMOTEROL FUMARATE 80-4.5 MCG/ACT IN AERO: INHALATION_SPRAY | RESPIRATORY_TRACT | 11 refills | Status: AC

## 2019-10-06 MED ORDER — FUROSEMIDE 20 MG PO TABS: 20.0000 mg | ORAL_TABLET | Freq: Every day | ORAL | 3 refills | Status: DC

## 2019-10-06 MED ORDER — PAROXETINE HCL 30 MG PO TABS: 30.0000 mg | ORAL_TABLET | Freq: Every morning | ORAL | 3 refills | Status: AC

## 2019-10-06 MED ORDER — ATORVASTATIN CALCIUM 10 MG PO TABS: 10.0000 mg | ORAL_TABLET | Freq: Every day | ORAL | 6 refills | Status: DC

## 2019-10-06 MED ORDER — METOPROLOL SUCCINATE ER 50 MG PO TB24: ORAL_TABLET | ORAL | 3 refills | Status: DC

## 2019-10-06 NOTE — Progress Notes (Signed)
[facility]   Established Patient Office Visit  Subjective:  Patient ID: Kathleen Herrera, female    DOB: 04-24-1960  Age: 59 y.o. MRN: [id number]  CC:  Chief Complaint  Patient presents with  . Allergic Reaction    Pt states she had an allergic reaction to cherry wine drink. Pt states her face was broken out.Pt states on her cheeks where the break out cleared up it feels a little rough.. Pt also wants Dorene Grebe to take a look under her brests.    HPI Kathleen Herrera is a 59 year old who presents for Follow Up today.    Patient Active Problem List   Diagnosis Date Noted  . Acute cystitis without hematuria 02/18/2019  . Prediabetes 02/18/2019  . Fungal skin infection 02/18/2019  . Hot flashes 11/05/2018  . Dizziness 11/05/2018  . Skin rash 04/20/2018  . Itching 04/20/2018  . Upper airway cough syndrome 06/21/2017  . Chronic obstructive pulmonary disease (HCC) 05/26/2017  . History of pneumonia 05/26/2017  . Generalized anxiety disorder 05/26/2017  . Bipolar 1 disorder, depressed, severe (HCC) 02/25/2017  . 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 Episodic recurrent vertigo   . Dermatitis   . Asthma   . CAP (community acquired pneumonia) 09/19/2015  . Dyspnea 09/19/2015  . Acute respiratory failure with hypoxia (HCC) 09/19/2015  . Abnormality of gait 06/29/2014  . Essential hypertension 06/29/2014  . Hyperlipidemia 06/29/2014  . Metabolic syndrome 06/29/2014  . Neuropathic pain 06/29/2014  . Gastroesophageal reflux disease without esophagitis 06/29/2014  . Tachycardia 06/29/2014  . At risk for osteopenia 06/29/2014  . Excessive daytime sleepiness 06/29/2014  . Fibromyalgia   . Abnormal LFTs 03/06/2013  . Morbid obesity due to excess calories (HCC) 12/18/2012  . Fatty infiltration of liver 07/15/2012  . Gonalgia 07/15/2012  . LBP (low back pain) 07/15/2012  . Arthritis, degenerative 06/12/2012  . Arthritis  05/26/2012  . Bipolar 2 disorder (HCC) 04/17/2012  . Clinical depression 04/17/2012    Past Medical History:  Diagnosis Date  . Arthritis   . Asthma   . 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 Bipolar disorder (HCC)   . Bronchitis   . Depression   . Dizziness 09/2018  . Fibromyalgia Y-2  . Fibromyalgia unknown  . Hyperlipemia   . Hyperlipidemia 02/2019  . Hypertension   . Post traumatic stress disorder   . Sinus congestion   . Urge incontinence 06/2019   Past Surgical History:  Procedure Laterality Date  . ABDOMINAL HYSTERECTOMY     Current Status: Since her last office visit, she is doing well with no complaints. She denies fevers, chills, fatigue, recent infections, weight loss, and night sweats. She has not had any headaches, visual changes, dizziness, and falls. No chest pain, heart palpitations, cough and shortness of breath reported. Denies GI problems such as nausea, vomiting, diarrhea, and constipation. She has no reports of blood in stools, dysuria and hematuria. No depression or anxiety reported today. She is taking all medications as prescribed. She denies pain today.   Family History  Problem Relation Age of Onset  . Dementia Mother   . Prostate cancer Father   . Dementia Father   . Prostate cancer Other   . CAD Other     Social History   Socioeconomic History  . Marital status: Single    Spouse name: Not on file  . Number of children: Not on file  . Years of education: Not on file  .  Highest education level: Not on file  Occupational History  . Not on file  Tobacco Use  . Smoking status: Current Every Day Smoker    Packs/day: 0.25    Years: 37.00    Pack years: 9.25    Types: Cigarettes  . Smokeless tobacco: Never Used  Vaping Use  . Vaping Use: Never used  Substance and Sexual Activity  . Alcohol use: Yes    Comment: occasionally  . Drug use: No    Comment: recovering crack addict  . Sexual activity: Not Currently  Other Topics Concern  . Not on file    Social History Narrative  . Not on file   Social Determinants of Health   Financial Resource Strain:   . Difficulty of Paying Living Expenses: Not on file  Food Insecurity:   . Worried About Programme researcher, broadcasting/film/video in the Last Year: Not on file  . Ran Out of Food in the Last Year: Not on file  Transportation Needs:   . Lack of Transportation (Medical): Not on file  . Lack of Transportation (Non-Medical): Not on file  Physical Activity:   . Days of Exercise per Week: Not on file  . Minutes of Exercise per Session: Not on file  Stress:   . Feeling of Stress : Not on file  Social Connections:   . Frequency of Communication with Friends and Family: Not on file  . Frequency of Social Gatherings with Friends and Family: Not on file  . Attends Religious Services: Not on file  . Active Member of Clubs or Organizations: Not on file  . Attends Banker Meetings: Not on file  . Marital Status: Not on file  Intimate Partner Violence:   . Fear of Current or Ex-Partner: Not on file  . Emotionally Abused: Not on file  . Physically Abused: Not on file  . Sexually Abused: Not on file    Outpatient Medications Prior to Visit  Medication Sig Dispense Refill  . albuterol (PROAIR HFA) 108 (90 Base) MCG/ACT inhaler Inhale 2 puffs into the lungs every 6 (six) hours as needed for wheezing or shortness of breath. 18 g 11  . amLODipine (NORVASC) 10 MG tablet TAKE ONE TABLET BY MOUTH DAILY 90 tablet 3  . atorvastatin (LIPITOR) 10 MG tablet Take 1 tablet (10 mg total) by mouth daily. 30 tablet 6  . furosemide (LASIX) 20 MG tablet TAKE 1 TABLET (20 MG TOTAL) BY MOUTH DAILY. 30 tablet 3  . hydrOXYzine (ATARAX/VISTARIL) 25 MG tablet Take 1 tablet (25 mg total) by mouth 3 (three) times daily as needed for itching or anxiety. 270 tablet 3  . lamoTRIgine (LAMICTAL) 25 MG tablet Take 1 tablet (25 mg total) by mouth 2 (two) times daily. For mood control 60 tablet 0  . levocetirizine (XYZAL) 5 MG tablet  Take 1 tablet (5 mg total) by mouth every evening. 90 tablet 3  . lithium carbonate 300 MG capsule Take 1 capsule (300 mg total) by mouth every morning. For mood control (Patient taking differently: Take 300-600 mg by mouth See admin instructions. Take 300 mg by mouth in the morning and 600 mg by mouth at bedtime) 30 capsule 0  . meclizine (ANTIVERT) 25 MG tablet TAKE ONE TABLET BY MOUTH THREE TIMES A DAY AS NEEDED FOR DIZZINESS 30 tablet 6  . metoprolol succinate (TOPROL-XL) 50 MG 24 hr tablet TAKE TWO TABLETS (100 MG TOTAL) BY MOUTH DAILY. TAKE WITH OR IMMEDIATELY FOLLOWING A MEAL. 180 tablet 11  .  nystatin (MYCOSTATIN/NYSTOP) powder Apply 2 sprinkles of powder the affected inner thighs and under breasts to prevent itching and fungal rash. 30 g 6  . oxybutynin (DITROPAN) 5 MG tablet Take 1 tablet (5 mg total) by mouth daily. 90 tablet 3  . pantoprazole (PROTONIX) 40 MG tablet TAKE ONE TABLET BY MOUTH TWICE A DAY BEFORE MEALS (IN THE MORNING AND IN THE EVENING) 60 tablet 2  . PARoxetine (PAXIL) 30 MG tablet Take 30 mg by mouth every morning.    . simvastatin (ZOCOR) 20 MG tablet TAKE ONE TABLET BY MOUTH AT BEDTIME 30 tablet 1  . clotrimazole-betamethasone (LOTRISONE) cream APPLY ONE APPLICATION TOPICALLY TWICE A DAY (Patient not taking: Reported on 10/06/2019) 90 g 1  . doxycycline (VIBRA-TABS) 100 MG tablet Take 1 tablet (100 mg total) by mouth 2 (two) times daily. (Patient not taking: Reported on 10/06/2019) 20 tablet 0  . Omega-3 Fatty Acids (FISH OIL) 1000 MG CAPS Take 1 capsule (1,000 mg total) by mouth daily. (Patient not taking: Reported on 10/06/2019) 30 capsule 6  . potassium chloride SA (K-DUR,KLOR-CON) 20 MEQ tablet Take 1 tablet (20 mEq total) by mouth daily. (Patient not taking: Reported on 08/01/2018) 3 tablet 0  . promethazine-dextromethorphan (PROMETHAZINE-DM) 6.25-15 MG/5ML syrup Take 5-10 mLs by mouth 4 (four) times daily as needed for cough. (Patient not taking: Reported on 11/04/2018)  240 mL 0  . SYMBICORT 80-4.5 MCG/ACT inhaler INHALE TWO PUFFS INTO THE LUNGS TWO TIMES DAILY. (Patient not taking: Reported on 08/18/2019) 1 Inhaler 1   No facility-administered medications prior to visit.    No Known Allergies  ROS Review of Systems  Constitutional: Negative.   HENT: Negative.   Eyes: Negative.   Respiratory: Negative.   Cardiovascular: Negative.   Gastrointestinal: Positive for abdominal distention.  Endocrine: Negative.   Genitourinary: Negative.   Musculoskeletal: Positive for arthralgias (generalized joint pain).  Skin: Negative.   Allergic/Immunologic: Positive for environmental allergies.  Neurological: Positive for dizziness (occasional ) and light-headedness (occasional ).  Hematological: Negative.   Psychiatric/Behavioral: Negative.    Objective:    Physical Exam Vitals and nursing note reviewed.  Constitutional:      Appearance: Normal appearance. She is obese.  HENT:     Head: Normocephalic and atraumatic.     Nose: Nose normal.     Mouth/Throat:     Mouth: Mucous membranes are moist.     Pharynx: Oropharynx is clear.  Cardiovascular:     Rate and Rhythm: Normal rate and regular rhythm.     Pulses: Normal pulses.     Heart sounds: Normal heart sounds.  Pulmonary:     Effort: Pulmonary effort is normal.     Breath sounds: Normal breath sounds.  Abdominal:     General: Bowel sounds are normal. There is distension (occasional ).     Palpations: Abdomen is soft.  Musculoskeletal:     Cervical back: Normal range of motion and neck supple.  Neurological:     Mental Status: She is alert.     BP (!) 143/81 (BP Location: Left Arm, Patient Position: Sitting, Cuff Size: Normal)   Pulse (!) 55   Temp (!) 97.3 F (36.3 C)   Resp 16   Ht 5\' 4"  (1.626 m)   Wt 245 lb (111.1 kg)   SpO2 100%   BMI 42.05 kg/m  Wt Readings from Last 3 Encounters:  10/06/19 245 lb (111.1 kg)  08/18/19 249 lb 6.4 oz (113.1 kg)  02/17/19 243 lb (110.2 kg)  Health Maintenance Due  Topic Date Due  . COVID-19 Vaccine (1) Never done  . PAP SMEAR-Modifier  Never done  . COLONOSCOPY  Never done  . INFLUENZA VACCINE  09/06/2019    There are no preventive care reminders to display for this patient.  Lab Results  Component Value Date   TSH 1.690 02/17/2019   Lab Results  Component Value Date   WBC 8.1 02/17/2019   HGB 14.2 02/17/2019   HCT 44.6 02/17/2019   MCV 82 02/17/2019   PLT 323 02/17/2019   Lab Results  Component Value Date   NA 138 02/17/2019   K 4.4 02/17/2019   CO2 23 02/17/2019   GLUCOSE 82 02/17/2019   BUN 13 02/17/2019   CREATININE 0.98 02/17/2019   BILITOT 0.5 02/17/2019   ALKPHOS 79 02/17/2019   AST 16 02/17/2019   ALT 15 02/17/2019   PROT 7.8 02/17/2019   ALBUMIN 4.5 02/17/2019   CALCIUM 9.9 02/17/2019   ANIONGAP 11 01/20/2018   Lab Results  Component Value Date   CHOL 259 (H) 02/17/2019   Lab Results  Component Value Date   HDL 94 02/17/2019   Lab Results  Component Value Date   LDLCALC 136 (H) 02/17/2019   Lab Results  Component Value Date   TRIG 170 (H) 02/17/2019   Lab Results  Component Value Date   CHOLHDL 2.8 02/17/2019   Lab Results  Component Value Date   HGBA1C 6.0 (A) 02/17/2019   Assessment & Plan:   1. Essential hypertension The current medical regimen is effective; blood pressure is stable at 143/81 today; continue present plan and medications as prescribed. She will continue to take medications as prescribed, to decrease high sodium intake, excessive alcohol intake, increase potassium intake, smoking cessation, and increase physical activity of at least 30 minutes of cardio activity daily. She will continue to follow Heart Healthy or DASH diet.  2. Generalized rash  3. Pruritus  4. Allergy, initial encounter  5. Shortness of breath Stable. No signs or symptoms of respiratory distress noted or reported today.   6. Hot flashes Stable today.  7. Follow up She  will follow up in 5 months.   No orders of the defined types were placed in this encounter.   No orders of the defined types were placed in this encounter.   Raliegh Ip,  MSN, FNP-BC [facility] [address] [place], [place] [address] [phone]   Problem List Items Addressed This Visit      Cardiovascular and Mediastinum   Essential hypertension - Primary   Hot flashes     Other   Dyspnea    Other Visit Diagnoses    Generalized rash       Pruritus       Allergy, initial encounter       Follow up          No orders of the defined types were placed in this encounter.   Follow-up: Return in about 5 months (around 03/07/2020).    Kallie Locks, FNP

## 2019-10-08 ENCOUNTER — Encounter: Payer: Self-pay | Admitting: Family Medicine

## 2019-10-26 ENCOUNTER — Other Ambulatory Visit: Payer: Self-pay | Admitting: Family Medicine

## 2019-10-26 DIAGNOSIS — R3915 Urgency of urination: Secondary | ICD-10-CM

## 2019-10-28 ENCOUNTER — Other Ambulatory Visit: Payer: Self-pay | Admitting: Family Medicine

## 2020-01-18 ENCOUNTER — Other Ambulatory Visit: Payer: Self-pay | Admitting: Family Medicine

## 2020-01-18 DIAGNOSIS — R3915 Urgency of urination: Secondary | ICD-10-CM

## 2020-02-10 ENCOUNTER — Telehealth: Payer: Self-pay | Admitting: Family Medicine

## 2020-02-10 NOTE — Telephone Encounter (Signed)
Done

## 2020-02-12 ENCOUNTER — Other Ambulatory Visit: Payer: Self-pay | Admitting: Family Medicine

## 2020-02-12 ENCOUNTER — Telehealth: Payer: Self-pay | Admitting: Family Medicine

## 2020-02-12 DIAGNOSIS — R42 Dizziness and giddiness: Secondary | ICD-10-CM

## 2020-02-12 DIAGNOSIS — I10 Essential (primary) hypertension: Secondary | ICD-10-CM

## 2020-02-13 ENCOUNTER — Other Ambulatory Visit: Payer: Self-pay | Admitting: Family Medicine

## 2020-02-13 DIAGNOSIS — E7800 Pure hypercholesterolemia, unspecified: Secondary | ICD-10-CM

## 2020-02-13 DIAGNOSIS — I10 Essential (primary) hypertension: Secondary | ICD-10-CM

## 2020-02-13 DIAGNOSIS — E782 Mixed hyperlipidemia: Secondary | ICD-10-CM

## 2020-02-13 DIAGNOSIS — K219 Gastro-esophageal reflux disease without esophagitis: Secondary | ICD-10-CM

## 2020-02-13 MED ORDER — PANTOPRAZOLE SODIUM 40 MG PO TBEC: 40.0000 mg | DELAYED_RELEASE_TABLET | Freq: Two times a day (BID) | ORAL | 3 refills | Status: DC

## 2020-02-13 MED ORDER — SIMVASTATIN 20 MG PO TABS: 20.0000 mg | ORAL_TABLET | Freq: Every day | ORAL | 3 refills | Status: DC

## 2020-02-13 MED ORDER — ATORVASTATIN CALCIUM 10 MG PO TABS: 10.0000 mg | ORAL_TABLET | Freq: Every day | ORAL | 3 refills | Status: DC

## 2020-02-13 MED ORDER — FUROSEMIDE 20 MG PO TABS: 20.0000 mg | ORAL_TABLET | Freq: Every day | ORAL | 3 refills | Status: DC

## 2020-02-13 MED ORDER — SIMVASTATIN 20 MG PO TABS: 20.0000 mg | ORAL_TABLET | Freq: Every day | ORAL | 3 refills | Status: AC

## 2020-02-13 NOTE — Telephone Encounter (Signed)
Done

## 2020-02-16 ENCOUNTER — Ambulatory Visit: Payer: Medicaid Other | Admitting: Family Medicine

## 2020-02-26 ENCOUNTER — Other Ambulatory Visit: Payer: Self-pay | Admitting: Family Medicine

## 2020-02-26 DIAGNOSIS — Z1231 Encounter for screening mammogram for malignant neoplasm of breast: Secondary | ICD-10-CM

## 2020-03-08 ENCOUNTER — Encounter: Payer: Medicaid Other | Admitting: Family Medicine

## 2020-03-21 ENCOUNTER — Other Ambulatory Visit: Payer: Self-pay | Admitting: Family Medicine

## 2020-03-21 DIAGNOSIS — B369 Superficial mycosis, unspecified: Secondary | ICD-10-CM

## 2020-04-05 ENCOUNTER — Ambulatory Visit (INDEPENDENT_AMBULATORY_CARE_PROVIDER_SITE_OTHER): Payer: Medicaid Other | Admitting: Family Medicine

## 2020-04-05 ENCOUNTER — Other Ambulatory Visit: Payer: Self-pay

## 2020-04-05 ENCOUNTER — Encounter: Payer: Self-pay | Admitting: Family Medicine

## 2020-04-05 VITALS — BP 147/78 | HR 62 | Ht 64.0 in | Wt 235.0 lb

## 2020-04-05 DIAGNOSIS — Z720 Tobacco use: Secondary | ICD-10-CM | POA: Diagnosis not present

## 2020-04-05 DIAGNOSIS — R059 Cough, unspecified: Secondary | ICD-10-CM

## 2020-04-05 DIAGNOSIS — I10 Essential (primary) hypertension: Secondary | ICD-10-CM

## 2020-04-05 DIAGNOSIS — R42 Dizziness and giddiness: Secondary | ICD-10-CM | POA: Diagnosis not present

## 2020-04-05 DIAGNOSIS — Z7189 Other specified counseling: Secondary | ICD-10-CM

## 2020-04-05 DIAGNOSIS — Z0000 Encounter for general adult medical examination without abnormal findings: Secondary | ICD-10-CM

## 2020-04-05 DIAGNOSIS — Z09 Encounter for follow-up examination after completed treatment for conditions other than malignant neoplasm: Secondary | ICD-10-CM

## 2020-04-05 MED ORDER — MECLIZINE HCL 25 MG PO TABS
25.0000 mg | ORAL_TABLET | Freq: Two times a day (BID) | ORAL | 3 refills | Status: AC
Start: 2020-04-05 — End: ?

## 2020-04-05 NOTE — Progress Notes (Signed)
[facility]   Established Patient Office Visit  Subjective:  Patient ID: Kathleen Herrera, female    DOB: 01-03-1961  Age: 60 y.o. MRN: [id number]  CC:  Chief Complaint  Patient presents with  . Annual Exam  . Insomnia    Wants sleep study  . Hypertension    HPI Kathleen Herrera is a 60 year old female who presents for Follow Up today.    Patient Active Problem List   Diagnosis Date Noted  . Acute cystitis without hematuria 02/18/2019  . Prediabetes 02/18/2019  . Fungal skin infection 02/18/2019  . Hot flashes 11/05/2018  . Dizziness 11/05/2018  . Skin rash 04/20/2018  . Itching 04/20/2018  . Upper airway cough syndrome 06/21/2017  . Chronic obstructive pulmonary disease (HCC) 05/26/2017  . History of pneumonia 05/26/2017  . Generalized anxiety disorder 05/26/2017  . Bipolar 1 disorder, depressed, severe (HCC) 02/25/2017  . 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 Episodic recurrent vertigo   . Dermatitis   . Asthma   . CAP (community acquired pneumonia) 09/19/2015  . Dyspnea 09/19/2015  . Acute respiratory failure with hypoxia (HCC) 09/19/2015  . Abnormality of gait 06/29/2014  . Essential hypertension 06/29/2014  . Hyperlipidemia 06/29/2014  . Metabolic syndrome 06/29/2014  . Neuropathic pain 06/29/2014  . Gastroesophageal reflux disease without esophagitis 06/29/2014  . Tachycardia 06/29/2014  . At risk for osteopenia 06/29/2014  . Excessive daytime sleepiness 06/29/2014  . Fibromyalgia   . Abnormal LFTs 03/06/2013  . Morbid obesity due to excess calories (HCC) 12/18/2012  . Fatty infiltration of liver 07/15/2012  . Gonalgia 07/15/2012  . LBP (low back pain) 07/15/2012  . Arthritis, degenerative 06/12/2012  . Arthritis 05/26/2012  . Bipolar 2 disorder (HCC) 04/17/2012  . Clinical depression 04/17/2012   Current Status: Since her last office visit, she is doing well with no complaints. She  denies visual changes, chest pain, cough, shortness of breath, heart palpitations, and falls. She has occasional headaches and dizziness with position changes. Denies severe headaches, confusion, seizures, double vision, and blurred vision, nausea and vomiting. She denies fevers, chills, fatigue, recent infections, weight loss, and night sweats. Denies GI problems such as diarrhea, and constipation. She has no reports of blood in stools, dysuria and hematuria. No depression or anxiety reported today. She is taking all medications as prescribed. She denies pain today.   Past Medical History:  Diagnosis Date  . Arthritis   . Asthma   . 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 Bipolar disorder (HCC)   . Bronchitis   . Depression   . Dizziness 09/2018  . Fibromyalgia Y-2  . Fibromyalgia unknown  . Hyperlipemia   . Hyperlipidemia 02/2019  . Hypertension   . Post traumatic stress disorder   . Sinus congestion   . Urge incontinence 06/2019    Past Surgical History:  Procedure Laterality Date  . ABDOMINAL HYSTERECTOMY      Family History  Problem Relation Age of Onset  . Dementia Mother   . Prostate cancer Father   . Dementia Father   . Prostate cancer Other   . CAD Other     Social History   Socioeconomic History  . Marital status: Single    Spouse name: Not on file  . Number of children: Not on file  . Years of education: Not on file  . Highest education level: Not on file  Occupational History  . Not on file  Tobacco Use  . Smoking status:  Current Every Day Smoker    Packs/day: 0.25    Years: 37.00    Pack years: 9.25    Types: Cigarettes  . Smokeless tobacco: Never Used  Vaping Use  . Vaping Use: Never used  Substance and Sexual Activity  . Alcohol use: Yes    Comment: occasionally  . Drug use: No    Comment: recovering crack addict  . Sexual activity: Not Currently  Other Topics Concern  . Not on file  Social History Narrative  . Not on file   Social Determinants of Health    Financial Resource Strain: Not on file  Food Insecurity: Not on file  Transportation Needs: Not on file  Physical Activity: Not on file  Stress: Not on file  Social Connections: Not on file  Intimate Partner Violence: Not on file    Outpatient Medications Prior to Visit  Medication Sig Dispense Refill  . albuterol (PROAIR HFA) 108 (90 Base) MCG/ACT inhaler Inhale 2 puffs into the lungs every 6 (six) hours as needed for wheezing or shortness of breath. 18 g 11  . amLODipine (NORVASC) 10 MG tablet TAKE ONE TABLET BY MOUTH DAILY 90 tablet 10  . budesonide-formoterol (SYMBICORT) 80-4.5 MCG/ACT inhaler INHALE TWO PUFFS INTO THE LUNGS TWO TIMES DAILY. 6.9 g 11  . furosemide (LASIX) 20 MG tablet Take 1 tablet (20 mg total) by mouth daily. As needed 90 tablet 3  . hydrOXYzine (ATARAX/VISTARIL) 25 MG tablet Take 1 tablet (25 mg total) by mouth 3 (three) times daily as needed for itching or anxiety. 270 tablet 3  . lamoTRIgine (LAMICTAL) 25 MG tablet Take 1 tablet (25 mg total) by mouth 2 (two) times daily. For mood control 60 tablet 0  . levocetirizine (XYZAL) 5 MG tablet Take 1 tablet (5 mg total) by mouth every evening. 90 tablet 3  . lithium carbonate 300 MG capsule Take 1 capsule (300 mg total) by mouth every morning. For mood control (Patient taking differently: Take 300-600 mg by mouth See admin instructions. Take 300 mg by mouth in the morning and 600 mg by mouth at bedtime) 30 capsule 0  . metoprolol succinate (TOPROL-XL) 50 MG 24 hr tablet TAKE TWO TABLETS BY MOUTH DAILY WITH OR IMMEDIATELY FOLLOWING A MEAL 180 tablet 3  . nystatin (NYSTATIN) powder APPLY TWO SPRINKLES OF POWDER THE AFFECTED INNER THIGHS & UNDER BREASTS TO PREVENT ITCHING AND RASH 30 g 5  . oxybutynin (DITROPAN) 5 MG tablet TAKE ONE TABLET BY MOUTH DAILY 30 tablet 4  . pantoprazole (PROTONIX) 40 MG tablet Take 1 tablet (40 mg total) by mouth 2 (two) times daily. 180 tablet 3  . PARoxetine (PAXIL) 30 MG tablet Take 1 tablet  (30 mg total) by mouth every morning. 90 tablet 3  . potassium chloride SA (K-DUR,KLOR-CON) 20 MEQ tablet Take 1 tablet (20 mEq total) by mouth daily. 3 tablet 0  . simvastatin (ZOCOR) 20 MG tablet Take 1 tablet (20 mg total) by mouth at bedtime. 90 tablet 3  . meclizine (ANTIVERT) 25 MG tablet TAKE ONE TABLET BY MOUTH THREE TIMES A DAY AS NEEDED FOR DIZZINESS 30 tablet 2  . clotrimazole-betamethasone (LOTRISONE) cream APPLY ONE APPLICATION TOPICALLY TWICE A DAY (Patient not taking: Reported on 10/06/2019) 90 g 1  . doxycycline (VIBRA-TABS) 100 MG tablet Take 1 tablet (100 mg total) by mouth 2 (two) times daily. (Patient not taking: Reported on 10/06/2019) 20 tablet 0  . Omega-3 Fatty Acids (FISH OIL) 1000 MG CAPS Take 1 capsule (1,000  mg total) by mouth daily. (Patient not taking: Reported on 10/06/2019) 30 capsule 6  . promethazine-dextromethorphan (PROMETHAZINE-DM) 6.25-15 MG/5ML syrup Take 5-10 mLs by mouth 4 (four) times daily as needed for cough. (Patient not taking: Reported on 11/04/2018) 240 mL 0   No facility-administered medications prior to visit.    No Known Allergies  ROS Review of Systems  Constitutional: Negative.   HENT: Negative.   Eyes: Negative.   Respiratory: Negative.   Cardiovascular: Negative.   Gastrointestinal: Positive for abdominal distention (obese).  Endocrine: Negative.   Genitourinary: Negative.   Musculoskeletal: Positive for arthralgias (generalized joint pain).  Skin: Negative.   Allergic/Immunologic: Negative.   Neurological: Positive for dizziness (occasional ) and headaches (occasional ).  Hematological: Negative.   Psychiatric/Behavioral: Negative.       Objective:    Physical Exam Vitals and nursing note reviewed.  Constitutional:      Appearance: Normal appearance. She is obese.  HENT:     Head: Normocephalic and atraumatic.     Nose: Nose normal.     Mouth/Throat:     Mouth: Mucous membranes are moist.     Pharynx: Oropharynx is clear.   Cardiovascular:     Rate and Rhythm: Normal rate and regular rhythm.     Pulses: Normal pulses.     Heart sounds: Normal heart sounds.  Pulmonary:     Effort: Pulmonary effort is normal.     Breath sounds: Normal breath sounds.  Abdominal:     General: Bowel sounds are normal.     Palpations: Abdomen is soft.  Musculoskeletal:        General: Normal range of motion.     Cervical back: Normal range of motion and neck supple.  Skin:    General: Skin is warm and dry.  Neurological:     General: No focal deficit present.     Mental Status: She is alert and oriented to person, place, and time.  Psychiatric:        Mood and Affect: Mood normal.        Thought Content: Thought content normal.        Judgment: Judgment normal.     BP (!) 147/78   Pulse 62   Ht 5\' 4"  (1.626 m)   Wt 235 lb (106.6 kg)   SpO2 100%   BMI 40.34 kg/m  Wt Readings from Last 3 Encounters:  04/05/20 235 lb (106.6 kg)  10/06/19 245 lb (111.1 kg)  08/18/19 249 lb 6.4 oz (113.1 kg)     Health Maintenance Due  Topic Date Due  . PAP SMEAR-Modifier  Never done  . COLONOSCOPY (Pts 45-79yrs Insurance coverage will need to be confirmed)  Never done  . INFLUENZA VACCINE  09/06/2019  . COVID-19 Vaccine (3 - Booster for Moderna series) 11/20/2019    There are no preventive care reminders to display for this patient.  Lab Results  Component Value Date   TSH 1.690 02/17/2019   Lab Results  Component Value Date   WBC 8.1 02/17/2019   HGB 14.2 02/17/2019   HCT 44.6 02/17/2019   MCV 82 02/17/2019   PLT 323 02/17/2019   Lab Results  Component Value Date   NA 138 02/17/2019   K 4.4 02/17/2019   CO2 23 02/17/2019   GLUCOSE 82 02/17/2019   BUN 13 02/17/2019   CREATININE 0.98 02/17/2019   BILITOT 0.5 02/17/2019   ALKPHOS 79 02/17/2019   AST 16 02/17/2019   ALT 15 02/17/2019  PROT 7.8 02/17/2019   ALBUMIN 4.5 02/17/2019   CALCIUM 9.9 02/17/2019   ANIONGAP 11 01/20/2018   Lab Results   Component Value Date   CHOL 259 (H) 02/17/2019   Lab Results  Component Value Date   HDL 94 02/17/2019   Lab Results  Component Value Date   LDLCALC 136 (H) 02/17/2019   Lab Results  Component Value Date   TRIG 170 (H) 02/17/2019   Lab Results  Component Value Date   CHOLHDL 2.8 02/17/2019   Lab Results  Component Value Date   HGBA1C 6.0 (A) 02/17/2019    Assessment & Plan:   1. Essential hypertension The current medical regimen is effective; blood pressure is stable at 147/78 today; continue present plan and medications as prescribed. She will continue to take medications as prescribed, to decrease high sodium intake, excessive alcohol intake, increase potassium intake, smoking cessation, and increase physical activity of at least 30 minutes of cardio activity daily. She will continue to follow Heart Healthy or DASH diet.  2. Dizziness - meclizine (ANTIVERT) 25 MG tablet; Take 1 tablet (25 mg total) by mouth 2 (two) times daily.  Dispense: 180 tablet; Refill: 3  3. Tobacco use  4. Cough  5. Breast self examination education, encounter for No abnormalities noted during breast exam today. She will continue with yearly breast exams.   6. Normal female breast exam  7. Healthcare maintenance - CBC with Differential - Comprehensive metabolic panel - Lipid Panel - TSH - Vitamin B12 - Vitamin D, 25-hydroxy  8. Follow up She will follow up for Annual Physical in 10/2020.  Meds ordered this encounter  Medications  . meclizine (ANTIVERT) 25 MG tablet    Sig: Take 1 tablet (25 mg total) by mouth 2 (two) times daily.    Dispense:  180 tablet    Refill:  3    Orders Placed This Encounter  Procedures  . CBC with Differential  . Comprehensive metabolic panel  . Lipid Panel  . TSH  . Vitamin B12  . Vitamin D, 25-hydroxy    Referral Orders  No referral(s) requested today    Raliegh IpNatalie Jaquel Glassburn, MSN, ANE, FNP-BC [facility] [address]  [place], [place] [address] [phone] fax  Problem List Items Addressed This Visit      Cardiovascular and Mediastinum   Essential hypertension - Primary     Other   Dizziness   Relevant Medications   meclizine (ANTIVERT) 25 MG tablet    Other Visit Diagnoses    Tobacco use       Cough       Breast self examination education, encounter for       Normal female breast exam       Healthcare maintenance       Relevant Orders   CBC with Differential   Comprehensive metabolic panel   Lipid Panel   TSH   Vitamin B12   Vitamin D, 25-hydroxy   Follow up          Meds ordered this encounter  Medications  . meclizine (ANTIVERT) 25 MG tablet    Sig: Take 1 tablet (25 mg total) by mouth 2 (two) times daily.    Dispense:  180 tablet    Refill:  3    Follow-up: No follow-ups on file.    Kallie LocksNatalie M Salena Ortlieb, FNP

## 2020-04-06 ENCOUNTER — Encounter: Payer: Self-pay | Admitting: Family Medicine

## 2020-04-06 LAB — COMPREHENSIVE METABOLIC PANEL
ALT: 13 IU/L (ref 0–32)
AST: 19 IU/L (ref 0–40)
Albumin/Globulin Ratio: 1.3 (ref 1.2–2.2)
Albumin: 4.8 g/dL (ref 3.8–4.9)
Alkaline Phosphatase: 88 IU/L (ref 44–121)
BUN/Creatinine Ratio: 7 — ABNORMAL LOW (ref 12–28)
BUN: 8 mg/dL (ref 8–27)
Bilirubin Total: 0.4 mg/dL (ref 0.0–1.2)
CO2: 23 mmol/L (ref 20–29)
Calcium: 10.3 mg/dL (ref 8.7–10.3)
Chloride: 100 mmol/L (ref 96–106)
Creatinine, Ser: 1.07 mg/dL — ABNORMAL HIGH (ref 0.57–1.00)
Globulin, Total: 3.6 g/dL (ref 1.5–4.5)
Glucose: 110 mg/dL — ABNORMAL HIGH (ref 65–99)
Potassium: 4.4 mmol/L (ref 3.5–5.2)
Sodium: 139 mmol/L (ref 134–144)
Total Protein: 8.4 g/dL (ref 6.0–8.5)
eGFR: 59 mL/min/{1.73_m2} — ABNORMAL LOW (ref >59)

## 2020-04-06 LAB — CBC WITH DIFFERENTIAL/PLATELET
Basophils Absolute: 0 10*3/uL (ref 0.0–0.2)
Basos: 1 %
EOS (ABSOLUTE): 0.2 10*3/uL (ref 0.0–0.4)
Eos: 3 %
Hematocrit: 45.6 % (ref 34.0–46.6)
Hemoglobin: 14.9 g/dL (ref 11.1–15.9)
Immature Grans (Abs): 0 10*3/uL (ref 0.0–0.1)
Immature Granulocytes: 0 %
Lymphocytes Absolute: 1.8 10*3/uL (ref 0.7–3.1)
Lymphs: 23 %
MCH: 26.1 pg — ABNORMAL LOW (ref 26.6–33.0)
MCHC: 32.7 g/dL (ref 31.5–35.7)
MCV: 80 fL (ref 79–97)
Monocytes Absolute: 0.5 10*3/uL (ref 0.1–0.9)
Monocytes: 7 %
Neutrophils Absolute: 5.4 10*3/uL (ref 1.4–7.0)
Neutrophils: 66 %
Platelets: 315 10*3/uL (ref 150–450)
RBC: 5.71 x10E6/uL — ABNORMAL HIGH (ref 3.77–5.28)
RDW: 14.7 % (ref 11.7–15.4)
WBC: 8 10*3/uL (ref 3.4–10.8)

## 2020-04-06 LAB — LIPID PANEL
Chol/HDL Ratio: 2.8 ratio (ref 0.0–4.4)
Cholesterol, Total: 221 mg/dL — ABNORMAL HIGH (ref 100–199)
HDL: 80 mg/dL (ref >39)
LDL Chol Calc (NIH): 114 mg/dL — ABNORMAL HIGH (ref 0–99)
Triglycerides: 155 mg/dL — ABNORMAL HIGH (ref 0–149)
VLDL Cholesterol Cal: 27 mg/dL (ref 5–40)

## 2020-04-06 LAB — VITAMIN B12: Vitamin B-12: 726 pg/mL (ref 232–1245)

## 2020-04-06 LAB — TSH: TSH: 1.73 u[IU]/mL (ref 0.450–4.500)

## 2020-04-06 LAB — VITAMIN D 25 HYDROXY (VIT D DEFICIENCY, FRACTURES): Vit D, 25-Hydroxy: 28.9 ng/mL — ABNORMAL LOW (ref 30.0–100.0)

## 2020-04-11 ENCOUNTER — Inpatient Hospital Stay: Admission: RE | Admit: 2020-04-11 | Payer: Medicaid Other | Source: Ambulatory Visit

## 2020-04-18 ENCOUNTER — Other Ambulatory Visit: Payer: Self-pay | Admitting: Family Medicine

## 2020-04-18 DIAGNOSIS — K219 Gastro-esophageal reflux disease without esophagitis: Secondary | ICD-10-CM

## 2020-04-18 DIAGNOSIS — E782 Mixed hyperlipidemia: Secondary | ICD-10-CM

## 2020-04-18 DIAGNOSIS — I10 Essential (primary) hypertension: Secondary | ICD-10-CM

## 2020-05-16 ENCOUNTER — Other Ambulatory Visit: Payer: Self-pay | Admitting: Family Medicine

## 2020-05-16 DIAGNOSIS — R3915 Urgency of urination: Secondary | ICD-10-CM

## 2020-06-14 ENCOUNTER — Ambulatory Visit: Payer: Medicaid Other

## 2020-08-02 ENCOUNTER — Inpatient Hospital Stay: Admission: RE | Admit: 2020-08-02 | Payer: Medicaid Other | Source: Ambulatory Visit

## 2020-09-27 ENCOUNTER — Ambulatory Visit: Payer: Medicaid Other

## 2020-10-07 ENCOUNTER — Ambulatory Visit: Payer: Medicaid Other | Admitting: Family Medicine

## 2020-10-07 ENCOUNTER — Ambulatory Visit: Payer: Medicaid Other | Admitting: Nurse Practitioner

## 2020-11-07 ENCOUNTER — Ambulatory Visit: Payer: Medicaid Other | Admitting: Nurse Practitioner

## 2020-11-15 ENCOUNTER — Ambulatory Visit: Payer: Medicaid Other

## 2020-11-15 ENCOUNTER — Ambulatory Visit: Payer: Medicaid Other | Admitting: Nurse Practitioner

## 2023-01-14 DIAGNOSIS — I10 Essential (primary) hypertension: Secondary | ICD-10-CM | POA: Diagnosis not present

## 2023-01-14 DIAGNOSIS — E1169 Type 2 diabetes mellitus with other specified complication: Secondary | ICD-10-CM | POA: Diagnosis not present

## 2023-01-14 DIAGNOSIS — E782 Mixed hyperlipidemia: Secondary | ICD-10-CM | POA: Diagnosis not present

## 2023-01-14 DIAGNOSIS — Z23 Encounter for immunization: Secondary | ICD-10-CM | POA: Diagnosis not present

## 2023-04-08 DIAGNOSIS — M62838 Other muscle spasm: Secondary | ICD-10-CM | POA: Diagnosis not present

## 2023-04-08 DIAGNOSIS — N3941 Urge incontinence: Secondary | ICD-10-CM | POA: Diagnosis not present

## 2023-04-08 DIAGNOSIS — E119 Type 2 diabetes mellitus without complications: Secondary | ICD-10-CM | POA: Diagnosis not present

## 2023-04-08 DIAGNOSIS — E785 Hyperlipidemia, unspecified: Secondary | ICD-10-CM | POA: Diagnosis not present

## 2023-04-08 DIAGNOSIS — I10 Essential (primary) hypertension: Secondary | ICD-10-CM | POA: Diagnosis not present

## 2023-04-08 DIAGNOSIS — F419 Anxiety disorder, unspecified: Secondary | ICD-10-CM | POA: Diagnosis not present

## 2023-04-08 DIAGNOSIS — J301 Allergic rhinitis due to pollen: Secondary | ICD-10-CM | POA: Diagnosis not present

## 2023-04-08 DIAGNOSIS — R42 Dizziness and giddiness: Secondary | ICD-10-CM | POA: Diagnosis not present

## 2023-04-08 DIAGNOSIS — F17210 Nicotine dependence, cigarettes, uncomplicated: Secondary | ICD-10-CM | POA: Diagnosis not present

## 2023-04-08 DIAGNOSIS — F319 Bipolar disorder, unspecified: Secondary | ICD-10-CM | POA: Diagnosis not present

## 2023-04-08 DIAGNOSIS — Z7984 Long term (current) use of oral hypoglycemic drugs: Secondary | ICD-10-CM | POA: Diagnosis not present

## 2023-04-08 DIAGNOSIS — K219 Gastro-esophageal reflux disease without esophagitis: Secondary | ICD-10-CM | POA: Diagnosis not present
